# Patient Record
Sex: Female | Born: 1965
Health system: Southern US, Community
[De-identification: ages and names within clinical notes are randomized; demographics above are authoritative.]

## PROBLEM LIST (undated history)

## (undated) DIAGNOSIS — K219 Gastro-esophageal reflux disease without esophagitis: Secondary | ICD-10-CM

## (undated) DIAGNOSIS — F419 Anxiety disorder, unspecified: Secondary | ICD-10-CM

## (undated) DIAGNOSIS — Z8489 Family history of other specified conditions: Secondary | ICD-10-CM

## (undated) DIAGNOSIS — K5792 Diverticulitis of intestine, part unspecified, without perforation or abscess without bleeding: Secondary | ICD-10-CM

## (undated) DIAGNOSIS — Z87442 Personal history of urinary calculi: Secondary | ICD-10-CM

## (undated) DIAGNOSIS — J189 Pneumonia, unspecified organism: Secondary | ICD-10-CM

## (undated) DIAGNOSIS — Z86718 Personal history of other venous thrombosis and embolism: Secondary | ICD-10-CM

## (undated) DIAGNOSIS — M1990 Unspecified osteoarthritis, unspecified site: Secondary | ICD-10-CM

## (undated) DIAGNOSIS — F32A Depression, unspecified: Secondary | ICD-10-CM

## (undated) DIAGNOSIS — M810 Age-related osteoporosis without current pathological fracture: Secondary | ICD-10-CM

## (undated) DIAGNOSIS — E785 Hyperlipidemia, unspecified: Secondary | ICD-10-CM

## (undated) DIAGNOSIS — N189 Chronic kidney disease, unspecified: Secondary | ICD-10-CM

## (undated) DIAGNOSIS — T7840XA Allergy, unspecified, initial encounter: Secondary | ICD-10-CM

## (undated) DIAGNOSIS — Q8901 Asplenia (congenital): Secondary | ICD-10-CM

## (undated) DIAGNOSIS — F329 Major depressive disorder, single episode, unspecified: Secondary | ICD-10-CM

## (undated) DIAGNOSIS — Q6680 Congenital vertical talus deformity, unspecified foot: Secondary | ICD-10-CM

## (undated) HISTORY — DX: Hyperlipidemia, unspecified: E78.5

## (undated) HISTORY — DX: Asplenia (congenital): Q89.01

## (undated) HISTORY — PX: OTHER SURGICAL HISTORY: SHX169

## (undated) HISTORY — DX: Depression, unspecified: F32.A

## (undated) HISTORY — PX: BREAST EXCISIONAL BIOPSY: SUR124

## (undated) HISTORY — PX: BREAST SURGERY: SHX581

## (undated) HISTORY — DX: Congenital vertical talus deformity, unspecified foot: Q66.80

## (undated) HISTORY — PX: DILATION AND CURETTAGE OF UTERUS: SHX78

## (undated) HISTORY — DX: Age-related osteoporosis without current pathological fracture: M81.0

## (undated) HISTORY — DX: Major depressive disorder, single episode, unspecified: F32.9

## (undated) HISTORY — PX: BREAST BIOPSY: SHX20

## (undated) HISTORY — DX: Allergy, unspecified, initial encounter: T78.40XA

---

## 1993-11-29 HISTORY — PX: BREAST EXCISIONAL BIOPSY: SUR124

## 1998-11-29 DIAGNOSIS — Q8901 Asplenia (congenital): Secondary | ICD-10-CM

## 1998-11-29 HISTORY — PX: OTHER SURGICAL HISTORY: SHX169

## 1998-11-29 HISTORY — DX: Asplenia (congenital): Q89.01

## 1998-11-29 HISTORY — PX: SPLENECTOMY: SUR1306

## 2013-01-26 ENCOUNTER — Other Ambulatory Visit: Payer: Self-pay | Admitting: Orthopedic Surgery

## 2013-01-29 ENCOUNTER — Encounter (HOSPITAL_BASED_OUTPATIENT_CLINIC_OR_DEPARTMENT_OTHER)
Admission: RE | Disposition: A | Discharge status: Home / Self Care | Payer: Self-pay | Source: Ambulatory Visit | Attending: Orthopedic Surgery

## 2013-01-29 ENCOUNTER — Ambulatory Visit (HOSPITAL_BASED_OUTPATIENT_CLINIC_OR_DEPARTMENT_OTHER): Payer: Worker's Compensation | Admitting: Anesthesiology

## 2013-01-29 ENCOUNTER — Ambulatory Visit (HOSPITAL_BASED_OUTPATIENT_CLINIC_OR_DEPARTMENT_OTHER)
Admission: RE | Admit: 2013-01-29 | Discharge: 2013-01-29 | Disposition: A | Discharge status: Home / Self Care | Payer: Worker's Compensation | Source: Ambulatory Visit | Attending: Orthopedic Surgery | Admitting: Orthopedic Surgery

## 2013-01-29 ENCOUNTER — Encounter (HOSPITAL_BASED_OUTPATIENT_CLINIC_OR_DEPARTMENT_OTHER): Payer: Self-pay | Admitting: Anesthesiology

## 2013-01-29 ENCOUNTER — Encounter (HOSPITAL_BASED_OUTPATIENT_CLINIC_OR_DEPARTMENT_OTHER): Payer: Self-pay | Admitting: *Deleted

## 2013-01-29 DIAGNOSIS — Y9269 Other specified industrial and construction area as the place of occurrence of the external cause: Secondary | ICD-10-CM | POA: Insufficient documentation

## 2013-01-29 DIAGNOSIS — K219 Gastro-esophageal reflux disease without esophagitis: Secondary | ICD-10-CM | POA: Insufficient documentation

## 2013-01-29 DIAGNOSIS — S61209A Unspecified open wound of unspecified finger without damage to nail, initial encounter: Secondary | ICD-10-CM | POA: Insufficient documentation

## 2013-01-29 DIAGNOSIS — F411 Generalized anxiety disorder: Secondary | ICD-10-CM | POA: Insufficient documentation

## 2013-01-29 DIAGNOSIS — Y990 Civilian activity done for income or pay: Secondary | ICD-10-CM | POA: Insufficient documentation

## 2013-01-29 DIAGNOSIS — W1809XA Striking against other object with subsequent fall, initial encounter: Secondary | ICD-10-CM | POA: Insufficient documentation

## 2013-01-29 HISTORY — DX: Anxiety disorder, unspecified: F41.9

## 2013-01-29 HISTORY — DX: Gastro-esophageal reflux disease without esophagitis: K21.9

## 2013-01-29 HISTORY — DX: Family history of other specified conditions: Z84.89

## 2013-01-29 HISTORY — DX: Pneumonia, unspecified organism: J18.9

## 2013-01-29 HISTORY — PX: FOREIGN BODY REMOVAL: SHX962

## 2013-01-29 SURGERY — REMOVAL FOREIGN BODY EXTREMITY
Anesthesia: General | Site: Hand | Laterality: Right | Wound class: Dirty or Infected

## 2013-01-29 MED ORDER — OXYCODONE HCL 5 MG PO TABS: 5.0000 mg | ORAL_TABLET | Freq: Once | ORAL | Status: DC | PRN

## 2013-01-29 MED ORDER — BUPIVACAINE HCL (PF) 0.25 % IJ SOLN
INTRAMUSCULAR | Status: DC | PRN
  Administered 2013-01-29: 4 mL

## 2013-01-29 MED ORDER — LIDOCAINE HCL (CARDIAC) 20 MG/ML IV SOLN
INTRAVENOUS | Status: DC | PRN
  Administered 2013-01-29: 50 mg via INTRAVENOUS

## 2013-01-29 MED ORDER — ONDANSETRON HCL 4 MG/2ML IJ SOLN
INTRAMUSCULAR | Status: DC | PRN
  Administered 2013-01-29: 4 mg via INTRAVENOUS

## 2013-01-29 MED ORDER — PROPOFOL 10 MG/ML IV BOLUS
INTRAVENOUS | Status: DC | PRN
  Administered 2013-01-29: 200 mg via INTRAVENOUS

## 2013-01-29 MED ORDER — CEFAZOLIN SODIUM-DEXTROSE 2-3 GM-% IV SOLR: 2.0000 g | INTRAVENOUS | Status: DC

## 2013-01-29 MED ORDER — HYDROMORPHONE HCL PF 1 MG/ML IJ SOLN: 0.2500 mg | INTRAMUSCULAR | Status: DC | PRN

## 2013-01-29 MED ORDER — PROMETHAZINE HCL 25 MG/ML IJ SOLN: 6.2500 mg | INTRAMUSCULAR | Status: DC | PRN

## 2013-01-29 MED ORDER — CHLORHEXIDINE GLUCONATE 4 % EX LIQD
60.0000 mL | Freq: Once | CUTANEOUS | Status: DC
Start: 2013-01-29 — End: 2013-01-29

## 2013-01-29 MED ORDER — CHLORHEXIDINE GLUCONATE 4 % EX LIQD: 60.0000 mL | Freq: Once | CUTANEOUS | Status: DC

## 2013-01-29 MED ORDER — FENTANYL CITRATE 0.05 MG/ML IJ SOLN
INTRAMUSCULAR | Status: DC | PRN
  Administered 2013-01-29 (×2): 50 ug via INTRAVENOUS

## 2013-01-29 MED ORDER — SUCCINYLCHOLINE CHLORIDE 20 MG/ML IJ SOLN
INTRAMUSCULAR | Status: DC | PRN
  Administered 2013-01-29: 20 mg via INTRAVENOUS

## 2013-01-29 MED ORDER — LACTATED RINGERS IV SOLN
INTRAVENOUS | Status: DC
  Administered 2013-01-29: 12:00:00 via INTRAVENOUS

## 2013-01-29 MED ORDER — OXYCODONE HCL 5 MG/5ML PO SOLN: 5.0000 mg | Freq: Once | ORAL | Status: DC | PRN

## 2013-01-29 MED ORDER — MIDAZOLAM HCL 5 MG/5ML IJ SOLN
INTRAMUSCULAR | Status: DC | PRN
  Administered 2013-01-29: 1 mg via INTRAVENOUS

## 2013-01-29 MED ORDER — MEPERIDINE HCL 25 MG/ML IJ SOLN: 6.2500 mg | INTRAMUSCULAR | Status: DC | PRN

## 2013-01-29 MED ORDER — CEFAZOLIN SODIUM-DEXTROSE 2-3 GM-% IV SOLR
2.0000 g | INTRAVENOUS | Status: AC
  Administered 2013-01-29: 2 g via INTRAVENOUS

## 2013-01-29 MED ORDER — DEXAMETHASONE SODIUM PHOSPHATE 10 MG/ML IJ SOLN
INTRAMUSCULAR | Status: DC | PRN
  Administered 2013-01-29: 10 mg via INTRAVENOUS

## 2013-01-29 MED ORDER — HYDROCODONE-ACETAMINOPHEN 5-325 MG PO TABS: 1.0000 | ORAL_TABLET | Freq: Four times a day (QID) | ORAL | Status: DC | PRN

## 2013-01-29 SURGICAL SUPPLY — 47 items
BANDAGE GAUZE ELAST BULKY 4 IN (GAUZE/BANDAGES/DRESSINGS) ×2 IMPLANT
BLADE MINI RND TIP GREEN BEAV (BLADE) IMPLANT
BLADE SURG 15 STRL LF DISP TIS (BLADE) ×1 IMPLANT
BLADE SURG 15 STRL SS (BLADE) ×1
BNDG COHESIVE 3X5 TAN STRL LF (GAUZE/BANDAGES/DRESSINGS) ×2 IMPLANT
BNDG ESMARK 4X9 LF (GAUZE/BANDAGES/DRESSINGS) ×2 IMPLANT
CHLORAPREP W/TINT 26ML (MISCELLANEOUS) ×2 IMPLANT
CORDS BIPOLAR (ELECTRODE) IMPLANT
COVER MAYO STAND STRL (DRAPES) ×2 IMPLANT
COVER TABLE BACK 60X90 (DRAPES) ×2 IMPLANT
CUFF TOURNIQUET SINGLE 18IN (TOURNIQUET CUFF) ×2 IMPLANT
DECANTER SPIKE VIAL GLASS SM (MISCELLANEOUS) IMPLANT
DRAPE EXTREMITY T 121X128X90 (DRAPE) ×2 IMPLANT
DRAPE OEC MINIVIEW 54X84 (DRAPES) ×2 IMPLANT
DRAPE SURG 17X23 STRL (DRAPES) ×2 IMPLANT
DRSG KUZMA FLUFF (GAUZE/BANDAGES/DRESSINGS) ×2 IMPLANT
GAUZE PACKING IODOFORM 1 (PACKING) ×2 IMPLANT
GAUZE XEROFORM 1X8 LF (GAUZE/BANDAGES/DRESSINGS) ×2 IMPLANT
GLOVE BIO SURGEON STRL SZ 6.5 (GLOVE) ×2 IMPLANT
GLOVE BIO SURGEON STRL SZ7.5 (GLOVE) ×2 IMPLANT
GLOVE BIOGEL PI IND STRL 8 (GLOVE) ×1 IMPLANT
GLOVE BIOGEL PI IND STRL 8.5 (GLOVE) ×1 IMPLANT
GLOVE BIOGEL PI INDICATOR 8 (GLOVE) ×1
GLOVE BIOGEL PI INDICATOR 8.5 (GLOVE) ×1
GLOVE SURG ORTHO 8.0 STRL STRW (GLOVE) ×2 IMPLANT
GOWN BRE IMP PREV XXLGXLNG (GOWN DISPOSABLE) ×4 IMPLANT
GOWN PREVENTION PLUS XLARGE (GOWN DISPOSABLE) ×2 IMPLANT
NEEDLE HYPO 22GX1.5 SAFETY (NEEDLE) IMPLANT
NS IRRIG 1000ML POUR BTL (IV SOLUTION) ×2 IMPLANT
PACK BASIN DAY SURGERY FS (CUSTOM PROCEDURE TRAY) ×2 IMPLANT
PAD CAST 3X4 CTTN HI CHSV (CAST SUPPLIES) ×1 IMPLANT
PADDING CAST ABS 3INX4YD NS (CAST SUPPLIES)
PADDING CAST ABS 4INX4YD NS (CAST SUPPLIES) ×1
PADDING CAST ABS COTTON 3X4 (CAST SUPPLIES) IMPLANT
PADDING CAST ABS COTTON 4X4 ST (CAST SUPPLIES) ×1 IMPLANT
PADDING CAST COTTON 3X4 STRL (CAST SUPPLIES) ×1
SPLINT PLASTER CAST XFAST 3X15 (CAST SUPPLIES) IMPLANT
SPLINT PLASTER XTRA FASTSET 3X (CAST SUPPLIES)
SPONGE GAUZE 4X4 12PLY (GAUZE/BANDAGES/DRESSINGS) ×2 IMPLANT
STOCKINETTE 4X48 STRL (DRAPES) ×2 IMPLANT
SUT ETHILON 5 0 P 3 18 (SUTURE)
SUT ETHILON 5 0 PS 2 18 (SUTURE) IMPLANT
SUT NYLON ETHILON 5-0 P-3 1X18 (SUTURE) IMPLANT
SYR CONTROL 10ML LL (SYRINGE) ×2 IMPLANT
TOWEL OR 17X24 6PK STRL BLUE (TOWEL DISPOSABLE) ×2 IMPLANT
UNDERPAD 30X30 INCONTINENT (UNDERPADS AND DIAPERS) ×2 IMPLANT
WATER STERILE IRR 1000ML POUR (IV SOLUTION) IMPLANT

## 2013-01-29 NOTE — Op Note (Signed)
Cathy Simmons, Cathy Simmons              ACCOUNT NO.:  [id number]  MEDICAL RECORD NO.:  [id number]  LOCATION:                                 FACILITY:  PHYSICIAN:  Cindee Salt, M.D.            DATE OF BIRTH:  DATE OF PROCEDURE:  01/29/2013 DATE OF DISCHARGE:                              OPERATIVE REPORT   PREOPERATIVE DIAGNOSIS:  Foreign bodies, right hand.  POSTOPERATIVE DIAGNOSIS:  Foreign bodies, right hand.  OPERATION:  Excision of foreign bodies, right small finger, metacarpophalangeal joint, volar aspect.  SURGEON:  Cindee Salt, M.D.  ASSISTANT:  Betha Loa, M.D.  ANESTHESIA:  General with local infiltration.  ANESTHESIOLOGIST:  Burna Forts, M.D.  HISTORY:  The patient is a 47 year old female who while at work, suffered an injury to her right small finger metacarpophalangeal joint crease with multiple small foreign bodies.  She is admitted now for excision.  This has become infected with a small pustule on the most dorsal aspect just beneath the skin.  Pre, peri, and postoperative course have been discussed along with risks and complications.  She has been on doxycycline.  She is aware that there is no guarantee with surgery; possibility of infection; recurrence of injury to arteries, nerves, tendons; incomplete relief of symptoms and dystrophy that this will be packed open to allow drainage.  In the preoperative area, the patient is seen, the extremity marked by both patient and surgeon.  PROCEDURE:  The patient was brought to the operating room where a general anesthetic was carried out without difficulty.  She was prepped using ChloraPrep, supine position with the right arm free.  A 3-minute dry time was allowed.  Time-out taken, confirming the patient and procedure.  The limb was exsanguinated from the wrist with an Esmarch bandage.  Tourniquet was placed on the upper arm was inflated to 250 mmHg.  An oblique incision was made.  Purulent material was  immediately encountered.  Cultures were taken.  One small foreign bodies and one large foreign body were removed without difficulty.  The wound was debrided sharply and the wound was copiously irrigated with saline and packed open with iodoform gauze.  Sterile compressive dressing was applied.  A local infiltration with 0.25% Marcaine without epinephrine was given, 4 mL was used.  Sterile compressive dressing applied.  On deflation of the tourniquet, all fingers were immediately pinked.  She was taken to the recovery room for observation in satisfactory condition.  She will be discharged home, to return in 3 days on Vicodin and doxycycline, awaiting cultures.          ______________________________ Cindee Salt, M.D.     GK/MEDQ  D:  01/29/2013  T:  01/29/2013  Job:  [id number]

## 2013-01-29 NOTE — Anesthesia Preprocedure Evaluation (Signed)
Anesthesia Evaluation   Patient awake    Reviewed: Allergy & Precautions, H&P , NPO status   History of Anesthesia Complications Negative for: history of anesthetic complications  Airway Mallampati: II      Dental  (+) Teeth Intact and Dental Advisory Given   Pulmonary  breath sounds clear to auscultation        Cardiovascular negative cardio ROS  Rhythm:Regular Rate:Normal     Neuro/Psych Anxiety    GI/Hepatic Neg liver ROS, GERD-  ,  Endo/Other  negative endocrine ROS  Renal/GU negative Renal ROS     Musculoskeletal   Abdominal   Peds  Hematology negative hematology ROS (+)   Anesthesia Other Findings   Reproductive/Obstetrics                           Anesthesia Physical Anesthesia Plan  ASA: I  Anesthesia Plan: General   Post-op Pain Management:    Induction: Intravenous  Airway Management Planned: LMA  Additional Equipment:   Intra-op Plan:   Post-operative Plan: Extubation in OR  Informed Consent:   Plan Discussed with: CRNA  Anesthesia Plan Comments:         Anesthesia Quick Evaluation

## 2013-01-29 NOTE — Brief Op Note (Signed)
01/29/2013  2:49 PM  PATIENT:  Cathy Simmons  47 y.o. female  PRE-OPERATIVE DIAG2NOSIS:  FOREIGN BODY RIGHT hand  POST-OPERATIVE DIAGNOSIS:  FOREIGN BODY RIGHT hand  PROCEDURE:  Procedure(s): EXCISION FOREIGN BODY RIGHT SMALL FINGER (Right)  SURGEON:  Surgeon(s) and Role:    * Nicki Reaper, MD - Primary  PHYSICIAN ASSISTANT:   ASSISTANTS: K Kuzma,MD   ANESTHESIA:   local and general  EBL:  Total I/O In: 1000 [I.V.:1000] Out: -   BLOOD ADMINISTERED:none  DRAINS: none   LOCAL MEDICATIONS USED:  MARCAINE     SPECIMEN:  Source of Specimen:  culture  DISPOSITION OF SPECIMEN:  PATHOLOGY  COUNTS:  YES  TOURNIQUET:   Total Tourniquet Time Documented: Upper Arm (Right) - 15 minutes Total: Upper Arm (Right) - 15 minutes   DICTATION: .Other Dictation: Dictation Number [id number]  PLAN OF CARE: Discharge to home after PACU  PATIENT DISPOSITION:  PACU - hemodynamically stable.

## 2013-01-29 NOTE — H&P (Signed)
Cathy Simmons is a 47 year-old right-hand dominant female with  an injury she suffered to her right hand primarily over the metacarpophalangeal joint of her right small finger. This occurred on 2/22 while at work when she rubbed across a paint can she noticed the top was changed.  She has had discomfort.  They attempted to remove possible foreign bodies and were unsuccessful. She as subsequently seen yesterday, given a tetanus shot, attempted removal was negative.  X-rays were ultimately performed revealing small foreign bodies over the metacarpophalangeal joint palmar aspect of her right hand, small finger.  She has no prior history of injuries, no history of diabetes, thyroid problems, arthritis or gout.  She complains of an intermittent, moderate stabbing type pain following the attempted removal yesterday with a feeling of swelling.  She states it has gotten somewhat worse since the attempted removal. She has been taking Tylenol for discomfort.  ALLERGIES:   Pneumonia vaccine, Bactrim and Cymbalta.  MEDICATIONS:    BuSpar, Dexilant, vitamins, Zyrtec and birth control pill.  SURGICAL HISTORY:    Splenectomy, cyst removal from her wrist, D&C, biopsy of breast.  FAMILY MEDICAL HISTORY:   Positive for diabetes, heart disease, high blood pressure and arthritis.  SOCIAL HISTORY:    She does not smoke or drink. 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  REVIEW OF SYSTEMS:     Positive for glasses, pneumonia, shortness of breath, pain with urination, depression, otherwise negative. Cathy Simmons is an 47 y.o. female.   Chief Complaint: FB RT Hand small finger MCP HPI: see above  Past Medical History  Diagnosis Date  . Family history of anesthesia complication     mother difficult to wake  . Anxiety   . Pneumonia     15 years ago  . GERD (gastroesophageal reflux disease)     Past Surgical History  Procedure Laterality Date  . Breast surgery  breat biopsy x2 last 1995  . Dilation and  curettage of uterus  x2 1984 and 1991  . Excision left ganglion cyst Left   . Removal spleen  2000    History reviewed. No pertinent family history. Social History:  reports that she quit smoking about 14 years ago. She has never used smokeless tobacco. She reports that she does not drink alcohol or use illicit drugs.  Allergies:  Allergies  Allergen Reactions  . Bactrim (Sulfamethoxazole W-Trimethoprim)   . Cymbalta (Duloxetine Hcl) Anxiety    Medications Prior to Admission  Medication Sig Dispense Refill  . busPIRone (BUSPAR) 15 MG tablet Take 15 mg by mouth 3 (three) times daily.      . cetirizine (ZYRTEC) 10 MG tablet Take 10 mg by mouth daily.      Marland Kitchen doxycycline (ADOXA) 100 MG tablet Take 100 mg by mouth 2 (two) times daily.        Results for orders placed during the hospital encounter of 01/29/13 (from the past 48 hour(s))  POCT HEMOGLOBIN-HEMACUE     Status: None   Collection Time    01/29/13 11:32 AM      Result Value Range   Hemoglobin 14.2  12.0 - 15.0 g/dL    No results found.   Pertinent items are noted in HPI.  Blood pressure 115/73, pulse 88, temperature 98.5 F (36.9 C), temperature source Oral, resp. rate 16, height 5\' 2"  (1.575 m), weight 55.611 kg (122 lb 9.6 oz), last menstrual period 01/12/2013, SpO2 98.00%.  General appearance: alert, cooperative and appears stated  age Head: Normocephalic, without obvious abnormality Neck: no JVD Resp: clear to auscultation bilaterally Cardio: regular rate and rhythm, S1, S2 normal, no murmur, click, rub or gallop GI: soft, non-tender; bowel sounds normal; no masses,  no organomegaly Extremities: extremities normal, atraumatic, no cyanosis or edema Pulses: 2+ and symmetric Skin: Skin color, texture, turgor normal. No rashes or lesions Neurologic: Grossly normal Incision/Wound: na  Assessment/Plan RADIOGRAPHS:   X-rays reveal foreign material over the metacarpophalangeal joint palmar aspect of her right small  finger.   RECOMMENDATIONS/PLAN:   Plan is for surgical excision. We will place her on doxycycline and this will be scheduled as an outpatient under regional anesthesia.  KUZMA,GARY R 01/29/2013, 1:13 PM

## 2013-01-29 NOTE — Anesthesia Procedure Notes (Signed)
Procedure Name: LMA Insertion Date/Time: 01/29/2013 2:21 PM Performed by: Zenia Resides D Pre-anesthesia Checklist: Patient identified, Emergency Drugs available, Suction available and Patient being monitored Patient Re-evaluated:Patient Re-evaluated prior to inductionOxygen Delivery Method: Circle System Utilized Preoxygenation: Pre-oxygenation with 100% oxygen Intubation Type: IV induction Ventilation: Mask ventilation without difficulty LMA: LMA inserted LMA Size: 4.0 Number of attempts: 1 Airway Equipment and Method: bite block Placement Confirmation: positive ETCO2 Tube secured with: Tape Dental Injury: Teeth and Oropharynx as per pre-operative assessment

## 2013-01-29 NOTE — Anesthesia Postprocedure Evaluation (Signed)
  Anesthesia Post-op Note  Patient: Cathy Simmons  Procedure(s) Performed: Procedure(s): EXCISION FOREIGN BODY RIGHT SMALL FINGER (Right)  Patient Location: PACU  Anesthesia Type:General  Level of Consciousness: awake  Airway and Oxygen Therapy: Patient Spontanous Breathing  Post-op Pain: none  Post-op Assessment: Post-op Vital signs reviewed  Post-op Vital Signs: stable  Complications: No apparent anesthesia complications

## 2013-01-29 NOTE — Transfer of Care (Signed)
Immediate Anesthesia Transfer of Care Note  Patient: Cathy Simmons  Procedure(s) Performed: Procedure(s): EXCISION FOREIGN BODY RIGHT SMALL FINGER (Right)  Patient Location: PACU  Anesthesia Type:General  Level of Consciousness: awake and alert   Airway & Oxygen Therapy: Patient Spontanous Breathing and Patient connected to face mask oxygen  Post-op Assessment: Report given to PACU RN and Post -op Vital signs reviewed and stable  Post vital signs: Reviewed and stable  Complications: No apparent anesthesia complications

## 2013-01-29 NOTE — Op Note (Signed)
Dictation Number [id number]

## 2013-01-31 ENCOUNTER — Encounter (HOSPITAL_BASED_OUTPATIENT_CLINIC_OR_DEPARTMENT_OTHER): Payer: Self-pay | Admitting: Orthopedic Surgery

## 2013-01-31 LAB — WOUND CULTURE: Culture: NO GROWTH

## 2013-02-04 LAB — ANAEROBIC CULTURE

## 2013-02-27 LAB — FUNGUS CULTURE W SMEAR

## 2013-03-14 LAB — AFB CULTURE WITH SMEAR (NOT AT ARMC): Acid Fast Smear: NONE SEEN

## 2013-11-29 HISTORY — PX: BREAST BIOPSY: SHX20

## 2013-12-03 ENCOUNTER — Encounter (HOSPITAL_COMMUNITY): Payer: Self-pay | Admitting: Emergency Medicine

## 2013-12-03 DIAGNOSIS — N3000 Acute cystitis without hematuria: Secondary | ICD-10-CM | POA: Insufficient documentation

## 2013-12-03 DIAGNOSIS — R109 Unspecified abdominal pain: Secondary | ICD-10-CM | POA: Insufficient documentation

## 2013-12-03 DIAGNOSIS — Z881 Allergy status to other antibiotic agents status: Secondary | ICD-10-CM | POA: Insufficient documentation

## 2013-12-03 DIAGNOSIS — Z8701 Personal history of pneumonia (recurrent): Secondary | ICD-10-CM | POA: Insufficient documentation

## 2013-12-03 DIAGNOSIS — R6883 Chills (without fever): Secondary | ICD-10-CM | POA: Insufficient documentation

## 2013-12-03 DIAGNOSIS — R5383 Other fatigue: Secondary | ICD-10-CM

## 2013-12-03 DIAGNOSIS — F411 Generalized anxiety disorder: Secondary | ICD-10-CM | POA: Insufficient documentation

## 2013-12-03 DIAGNOSIS — R110 Nausea: Secondary | ICD-10-CM | POA: Insufficient documentation

## 2013-12-03 DIAGNOSIS — Z3202 Encounter for pregnancy test, result negative: Secondary | ICD-10-CM | POA: Insufficient documentation

## 2013-12-03 DIAGNOSIS — Z87891 Personal history of nicotine dependence: Secondary | ICD-10-CM | POA: Insufficient documentation

## 2013-12-03 DIAGNOSIS — Z79899 Other long term (current) drug therapy: Secondary | ICD-10-CM | POA: Insufficient documentation

## 2013-12-03 DIAGNOSIS — K219 Gastro-esophageal reflux disease without esophagitis: Secondary | ICD-10-CM | POA: Insufficient documentation

## 2013-12-03 DIAGNOSIS — R5381 Other malaise: Secondary | ICD-10-CM | POA: Insufficient documentation

## 2013-12-03 DIAGNOSIS — Z888 Allergy status to other drugs, medicaments and biological substances status: Secondary | ICD-10-CM | POA: Insufficient documentation

## 2013-12-03 LAB — URINALYSIS, ROUTINE W REFLEX MICROSCOPIC
Bilirubin Urine: NEGATIVE
Glucose, UA: NEGATIVE mg/dL
KETONES UR: 15 mg/dL — AB
NITRITE: NEGATIVE
PH: 5.5 (ref 5.0–8.0)
Protein, ur: 100 mg/dL — AB
Specific Gravity, Urine: 1.023 (ref 1.005–1.030)
Urobilinogen, UA: 1 mg/dL (ref 0.0–1.0)

## 2013-12-03 LAB — URINE MICROSCOPIC-ADD ON

## 2013-12-03 LAB — POCT PREGNANCY, URINE: Preg Test, Ur: NEGATIVE

## 2013-12-03 NOTE — ED Notes (Signed)
Pt reports pelvic "pressure" that began this a.m. - pt states she has been experiencing hematuria and noting blood clots w/ urination - pt w/ lower abd pain and lower back pain - pt admits to nausea denies vomiting. Denies any known fever.

## 2013-12-04 ENCOUNTER — Emergency Department (HOSPITAL_COMMUNITY)
Admission: EM | Admit: 2013-12-04 | Discharge: 2013-12-04 | Disposition: A | Discharge status: Home / Self Care | Payer: BC Managed Care – PPO | Attending: Emergency Medicine | Admitting: Emergency Medicine

## 2013-12-04 DIAGNOSIS — N3090 Cystitis, unspecified without hematuria: Secondary | ICD-10-CM

## 2013-12-04 LAB — CBC WITH DIFFERENTIAL/PLATELET
Basophils Absolute: 0.1 10*3/uL (ref 0.0–0.1)
Basophils Relative: 0 % (ref 0–1)
Eosinophils Absolute: 0 10*3/uL (ref 0.0–0.7)
Eosinophils Relative: 0 % (ref 0–5)
HCT: 41 % (ref 36.0–46.0)
Hemoglobin: 14.3 g/dL (ref 12.0–15.0)
Lymphocytes Relative: 16 % (ref 12–46)
Lymphs Abs: 2.7 10*3/uL (ref 0.7–4.0)
MCH: 33.1 pg (ref 26.0–34.0)
MCHC: 34.9 g/dL (ref 30.0–36.0)
MCV: 94.9 fL (ref 78.0–100.0)
Monocytes Absolute: 1.3 10*3/uL — ABNORMAL HIGH (ref 0.1–1.0)
Monocytes Relative: 8 % (ref 3–12)
NEUTROS ABS: 13 10*3/uL — AB (ref 1.7–7.7)
NEUTROS PCT: 76 % (ref 43–77)
Platelets: 314 10*3/uL (ref 150–400)
RBC: 4.32 MIL/uL (ref 3.87–5.11)
RDW: 13.8 % (ref 11.5–15.5)
WBC: 17 10*3/uL — ABNORMAL HIGH (ref 4.0–10.5)

## 2013-12-04 LAB — COMPREHENSIVE METABOLIC PANEL
ALBUMIN: 3.4 g/dL — AB (ref 3.5–5.2)
ALK PHOS: 72 U/L (ref 39–117)
ALT: 9 U/L (ref 0–35)
AST: 13 U/L (ref 0–37)
BILIRUBIN TOTAL: 0.4 mg/dL (ref 0.3–1.2)
BUN: 12 mg/dL (ref 6–23)
CO2: 26 mEq/L (ref 19–32)
Calcium: 9.3 mg/dL (ref 8.4–10.5)
Chloride: 102 mEq/L (ref 96–112)
Creatinine, Ser: 0.63 mg/dL (ref 0.50–1.10)
GFR calc Af Amer: >90 mL/min (ref >90)
GFR calc non Af Amer: >90 mL/min (ref >90)
Glucose, Bld: 93 mg/dL (ref 70–99)
POTASSIUM: 4 meq/L (ref 3.7–5.3)
SODIUM: 140 meq/L (ref 137–147)
Total Protein: 7.7 g/dL (ref 6.0–8.3)

## 2013-12-04 MED ORDER — DEXTROSE 5 % IV SOLN
1.0000 g | Freq: Once | INTRAVENOUS | Status: AC
  Administered 2013-12-04: 1 g via INTRAVENOUS
  Filled 2013-12-04: qty 10

## 2013-12-04 MED ORDER — SODIUM CHLORIDE 0.9 % IV BOLUS (SEPSIS)
1000.0000 mL | Freq: Once | INTRAVENOUS | Status: AC
  Administered 2013-12-04: 1000 mL via INTRAVENOUS

## 2013-12-04 MED ORDER — PHENAZOPYRIDINE HCL 200 MG PO TABS: 200.0000 mg | ORAL_TABLET | Freq: Three times a day (TID) | ORAL | Status: DC | PRN

## 2013-12-04 MED ORDER — CEPHALEXIN 500 MG PO CAPS: 500.0000 mg | ORAL_CAPSULE | Freq: Four times a day (QID) | ORAL | Status: DC

## 2013-12-04 MED ORDER — IBUPROFEN 600 MG PO TABS: 600.0000 mg | ORAL_TABLET | Freq: Four times a day (QID) | ORAL | Status: DC | PRN

## 2013-12-04 MED ORDER — KETOROLAC TROMETHAMINE 30 MG/ML IJ SOLN
30.0000 mg | Freq: Once | INTRAMUSCULAR | Status: AC
  Administered 2013-12-04: 30 mg via INTRAVENOUS
  Filled 2013-12-04: qty 1

## 2013-12-04 NOTE — Discharge Instructions (Signed)
Urinary Tract Infection  Urinary tract infections (UTIs) can develop anywhere along your urinary tract. Your urinary tract is your body's drainage system for removing wastes and extra water. Your urinary tract includes two kidneys, two ureters, a bladder, and a urethra. Your kidneys are a pair of bean-shaped organs. Each kidney is about the size of your fist. They are located below your ribs, one on each side of your spine.  CAUSES  Infections are caused by microbes, which are microscopic organisms, including fungi, viruses, and bacteria. These organisms are so small that they can only be seen through a microscope. Bacteria are the microbes that most commonly cause UTIs.  SYMPTOMS   Symptoms of UTIs may vary by age and gender of the patient and by the location of the infection. Symptoms in young women typically include a frequent and intense urge to urinate and a painful, burning feeling in the bladder or urethra during urination. Older women and men are more likely to be tired, shaky, and weak and have muscle aches and abdominal pain. A fever may mean the infection is in your kidneys. Other symptoms of a kidney infection include pain in your back or sides below the ribs, nausea, and vomiting.  DIAGNOSIS  To diagnose a UTI, your caregiver will ask you about your symptoms. Your caregiver also will ask to provide a urine sample. The urine sample will be tested for bacteria and white blood cells. White blood cells are made by your body to help fight infection.  TREATMENT   Typically, UTIs can be treated with medication. Because most UTIs are caused by a bacterial infection, they usually can be treated with the use of antibiotics. The choice of antibiotic and length of treatment depend on your symptoms and the type of bacteria causing your infection.  HOME CARE INSTRUCTIONS   If you were prescribed antibiotics, take them exactly as your caregiver instructs you. Finish the medication even if you feel better after you  have only taken some of the medication.   Drink enough water and fluids to keep your urine clear or pale yellow.   Avoid caffeine, tea, and carbonated beverages. They tend to irritate your bladder.   Empty your bladder often. Avoid holding urine for long periods of time.   Empty your bladder before and after sexual intercourse.   After a bowel movement, women should cleanse from front to back. Use each tissue only once.  SEEK MEDICAL CARE IF:    You have back pain.   You develop a fever.   Your symptoms do not begin to resolve within 3 days.  SEEK IMMEDIATE MEDICAL CARE IF:    You have severe back pain or lower abdominal pain.   You develop chills.   You have nausea or vomiting.   You have continued burning or discomfort with urination.  MAKE SURE YOU:    Understand these instructions.   Will watch your condition.   Will get help right away if you are not doing well or get worse.  Document Released: 08/25/2005 Document Revised: 05/16/2012 Document Reviewed: 12/24/2011  [employer] Patient Information 2014 [employer].

## 2013-12-04 NOTE — ED Provider Notes (Signed)
CSN: [id number]     Arrival date & time 12/03/13  1916 History   First MD Initiated Contact with Patient 12/04/13 0009     Chief Complaint  Patient presents with  . Hematuria  . Abdominal Pain   (Consider location/radiation/quality/duration/timing/severity/associated sxs/prior Treatment) HPI Patient presents with one day of lower abdominal pressure, urinary urgency, frequency and dysuria. She has had gross hematuria with clots. She's had chills but no subjective fever. Just generalized fatigue. She has had nausea without vomiting or diarrhea. She denies vaginal bleeding or discharge. She still has regular periods the last one being last month. She has no flank pain. Past Medical History  Diagnosis Date  . Family history of anesthesia complication     mother difficult to wake  . Anxiety   . Pneumonia     15 years ago  . GERD (gastroesophageal reflux disease)    Past Surgical History  Procedure Laterality Date  . Breast surgery  breat biopsy x2 last 1995  . Dilation and curettage of uterus  x2 1984 and 1991  . Excision left ganglion cyst Left   . Removal spleen  2000  . Foreign body removal Right 01/29/2013    Procedure: EXCISION FOREIGN BODY RIGHT SMALL FINGER;  Surgeon: Nicki ReaperGary R Kuzma, MD;  Location: [facility];  Service: Orthopedics;  Laterality: Right;   History reviewed. No pertinent family history. History  Substance Use Topics  . Smoking status: Former Smoker -- 0.50 packs/day    Quit date: 01/30/1999  . Smokeless tobacco: Never Used  . Alcohol Use: No   OB History   Grav Para Term Preterm Abortions TAB SAB Ect Mult Living                 Review of Systems  Constitutional: Positive for chills and fatigue. Negative for fever.  Respiratory: Negative for shortness of breath.   Cardiovascular: Negative for chest pain.  Gastrointestinal: Positive for nausea and abdominal pain. Negative for vomiting, diarrhea and constipation.  Genitourinary: Positive for  dysuria, frequency and hematuria. Negative for flank pain, vaginal bleeding, vaginal discharge and vaginal pain.  Musculoskeletal: Negative for back pain, neck pain and neck stiffness.  Skin: Negative for rash and wound.  Neurological: Negative for dizziness, weakness, light-headedness, numbness and headaches.  All other systems reviewed and are negative.    Allergies  Bactrim and Cymbalta  Home Medications   Current Outpatient Rx  Name  Route  Sig  Dispense  Refill  . busPIRone (BUSPAR) 15 MG tablet   Oral   Take 15 mg by mouth 3 (three) times daily.         . cetirizine (ZYRTEC) 10 MG tablet   Oral   Take 10 mg by mouth at bedtime.          Marland Kitchen. dexlansoprazole (DEXILANT) 60 MG capsule   Oral   Take 60 mg by mouth every morning.         Marland Kitchen. FLUoxetine (PROZAC) 40 MG capsule   Oral   Take 40 mg by mouth every evening.         Marland Kitchen. levonorgestrel-ethinyl estradiol (NORDETTE) 0.15-30 MG-MCG tablet   Oral   Take 1 tablet by mouth every evening.          BP 127/57  Pulse 100  Temp(Src) 98.9 F (37.2 C) (Oral)  Resp 20  Ht 5\' 2"  (1.575 m)  Wt 123 lb (55.792 kg)  BMI 22.49 kg/m2  SpO2 97%  LMP 11/14/2013 Physical Exam  Nursing note and vitals reviewed. Constitutional: She is oriented to person, place, and time. She appears well-developed and well-nourished. No distress.  HENT:  Head: Normocephalic and atraumatic.  Mouth/Throat: Oropharynx is clear and moist.  Eyes: EOM are normal. Pupils are equal, round, and reactive to light.  Neck: Normal range of motion. Neck supple.  Cardiovascular: Normal rate and regular rhythm.   Pulmonary/Chest: Effort normal and breath sounds normal. No respiratory distress. She has no wheezes. She has no rales.  Abdominal: Soft. Bowel sounds are normal.  Musculoskeletal: Normal range of motion. She exhibits no edema and no tenderness.  Neurological: She is alert and oriented to person, place, and time.  Skin: Skin is warm and dry. No  rash noted. No erythema.  Psychiatric: She has a normal mood and affect. Her behavior is normal.    ED Course  Procedures (including critical care time) Labs Review Labs Reviewed  URINALYSIS, ROUTINE W REFLEX MICROSCOPIC - Abnormal; Notable for the following:    Color, Urine RED (*)    APPearance TURBID (*)    Hgb urine dipstick LARGE (*)    Ketones, ur 15 (*)    Protein, ur 100 (*)    Leukocytes, UA LARGE (*)    All other components within normal limits  URINE MICROSCOPIC-ADD ON - Abnormal; Notable for the following:    Bacteria, UA MANY (*)    All other components within normal limits  URINE CULTURE  CBC WITH DIFFERENTIAL  COMPREHENSIVE METABOLIC PANEL  POCT PREGNANCY, URINE   Imaging Review No results found.  EKG Interpretation   None       MDM  Patient is feeling better after IV fluids. Vital signs remained stable. Abdomen is soft and nontender. She has no CVA tenderness. We'll treat for cystitis. Return precautions have been given. Patient was understanding.    Loren Racer, MD 12/04/13 [phone]

## 2013-12-04 NOTE — ED Notes (Signed)
MD at bedside. 

## 2013-12-05 LAB — URINE CULTURE: Colony Count: >=100000

## 2013-12-06 NOTE — ED Notes (Signed)
No further treatment necessary per Heather Laisure. 

## 2016-03-28 DIAGNOSIS — J029 Acute pharyngitis, unspecified: Secondary | ICD-10-CM | POA: Diagnosis not present

## 2016-05-10 DIAGNOSIS — E782 Mixed hyperlipidemia: Secondary | ICD-10-CM | POA: Diagnosis not present

## 2016-05-10 DIAGNOSIS — K219 Gastro-esophageal reflux disease without esophagitis: Secondary | ICD-10-CM | POA: Diagnosis not present

## 2016-05-12 DIAGNOSIS — I636 Cerebral infarction due to cerebral venous thrombosis, nonpyogenic: Secondary | ICD-10-CM | POA: Diagnosis not present

## 2016-05-12 DIAGNOSIS — Z0001 Encounter for general adult medical examination with abnormal findings: Secondary | ICD-10-CM | POA: Diagnosis not present

## 2016-05-12 DIAGNOSIS — J301 Allergic rhinitis due to pollen: Secondary | ICD-10-CM | POA: Diagnosis not present

## 2016-05-12 DIAGNOSIS — E782 Mixed hyperlipidemia: Secondary | ICD-10-CM | POA: Diagnosis not present

## 2016-05-12 DIAGNOSIS — Z1212 Encounter for screening for malignant neoplasm of rectum: Secondary | ICD-10-CM | POA: Diagnosis not present

## 2016-05-12 DIAGNOSIS — F331 Major depressive disorder, recurrent, moderate: Secondary | ICD-10-CM | POA: Diagnosis not present

## 2016-07-15 DIAGNOSIS — Z6824 Body mass index (BMI) 24.0-24.9, adult: Secondary | ICD-10-CM | POA: Diagnosis not present

## 2016-07-15 DIAGNOSIS — L041 Acute lymphadenitis of trunk: Secondary | ICD-10-CM | POA: Diagnosis not present

## 2016-09-09 DIAGNOSIS — F331 Major depressive disorder, recurrent, moderate: Secondary | ICD-10-CM | POA: Diagnosis not present

## 2016-09-09 DIAGNOSIS — R300 Dysuria: Secondary | ICD-10-CM | POA: Diagnosis not present

## 2016-09-09 DIAGNOSIS — J301 Allergic rhinitis due to pollen: Secondary | ICD-10-CM | POA: Diagnosis not present

## 2016-09-09 DIAGNOSIS — E782 Mixed hyperlipidemia: Secondary | ICD-10-CM | POA: Diagnosis not present

## 2016-09-09 DIAGNOSIS — Z1389 Encounter for screening for other disorder: Secondary | ICD-10-CM | POA: Diagnosis not present

## 2016-09-09 DIAGNOSIS — I636 Cerebral infarction due to cerebral venous thrombosis, nonpyogenic: Secondary | ICD-10-CM | POA: Diagnosis not present

## 2016-09-09 DIAGNOSIS — Z23 Encounter for immunization: Secondary | ICD-10-CM | POA: Diagnosis not present

## 2016-09-09 DIAGNOSIS — M1611 Unilateral primary osteoarthritis, right hip: Secondary | ICD-10-CM | POA: Diagnosis not present

## 2016-09-10 DIAGNOSIS — R591 Generalized enlarged lymph nodes: Secondary | ICD-10-CM | POA: Diagnosis not present

## 2016-09-10 DIAGNOSIS — K219 Gastro-esophageal reflux disease without esophagitis: Secondary | ICD-10-CM | POA: Diagnosis not present

## 2016-09-10 DIAGNOSIS — Z86718 Personal history of other venous thrombosis and embolism: Secondary | ICD-10-CM | POA: Diagnosis not present

## 2016-09-10 DIAGNOSIS — Z79899 Other long term (current) drug therapy: Secondary | ICD-10-CM | POA: Diagnosis not present

## 2016-09-10 DIAGNOSIS — F329 Major depressive disorder, single episode, unspecified: Secondary | ICD-10-CM | POA: Diagnosis not present

## 2016-09-12 DIAGNOSIS — Z887 Allergy status to serum and vaccine status: Secondary | ICD-10-CM | POA: Diagnosis not present

## 2016-09-12 DIAGNOSIS — Z86718 Personal history of other venous thrombosis and embolism: Secondary | ICD-10-CM | POA: Diagnosis not present

## 2016-09-12 DIAGNOSIS — Z881 Allergy status to other antibiotic agents status: Secondary | ICD-10-CM | POA: Diagnosis not present

## 2016-09-12 DIAGNOSIS — K219 Gastro-esophageal reflux disease without esophagitis: Secondary | ICD-10-CM | POA: Diagnosis not present

## 2016-09-12 DIAGNOSIS — R197 Diarrhea, unspecified: Secondary | ICD-10-CM | POA: Diagnosis not present

## 2016-09-12 DIAGNOSIS — F99 Mental disorder, not otherwise specified: Secondary | ICD-10-CM | POA: Diagnosis not present

## 2016-09-12 DIAGNOSIS — M7989 Other specified soft tissue disorders: Secondary | ICD-10-CM | POA: Diagnosis not present

## 2016-09-12 DIAGNOSIS — I82890 Acute embolism and thrombosis of other specified veins: Secondary | ICD-10-CM | POA: Diagnosis not present

## 2016-09-12 DIAGNOSIS — Z87891 Personal history of nicotine dependence: Secondary | ICD-10-CM | POA: Diagnosis not present

## 2016-09-12 DIAGNOSIS — M542 Cervicalgia: Secondary | ICD-10-CM | POA: Diagnosis not present

## 2016-09-12 DIAGNOSIS — I6782 Cerebral ischemia: Secondary | ICD-10-CM | POA: Diagnosis not present

## 2016-09-12 DIAGNOSIS — I668 Occlusion and stenosis of other cerebral arteries: Secondary | ICD-10-CM | POA: Diagnosis not present

## 2016-09-12 DIAGNOSIS — R221 Localized swelling, mass and lump, neck: Secondary | ICD-10-CM | POA: Diagnosis not present

## 2016-09-12 DIAGNOSIS — Z9109 Other allergy status, other than to drugs and biological substances: Secondary | ICD-10-CM | POA: Diagnosis not present

## 2016-09-12 DIAGNOSIS — G08 Intracranial and intraspinal phlebitis and thrombophlebitis: Secondary | ICD-10-CM | POA: Diagnosis not present

## 2016-09-12 DIAGNOSIS — I8290 Acute embolism and thrombosis of unspecified vein: Secondary | ICD-10-CM | POA: Diagnosis not present

## 2016-09-13 DIAGNOSIS — G08 Intracranial and intraspinal phlebitis and thrombophlebitis: Secondary | ICD-10-CM | POA: Diagnosis not present

## 2016-09-13 DIAGNOSIS — M7989 Other specified soft tissue disorders: Secondary | ICD-10-CM | POA: Diagnosis not present

## 2016-09-13 DIAGNOSIS — I8290 Acute embolism and thrombosis of unspecified vein: Secondary | ICD-10-CM | POA: Diagnosis not present

## 2016-09-14 DIAGNOSIS — I8290 Acute embolism and thrombosis of unspecified vein: Secondary | ICD-10-CM | POA: Diagnosis not present

## 2016-09-14 DIAGNOSIS — R221 Localized swelling, mass and lump, neck: Secondary | ICD-10-CM | POA: Diagnosis not present

## 2016-09-14 DIAGNOSIS — G08 Intracranial and intraspinal phlebitis and thrombophlebitis: Secondary | ICD-10-CM | POA: Diagnosis not present

## 2016-09-24 DIAGNOSIS — Z6824 Body mass index (BMI) 24.0-24.9, adult: Secondary | ICD-10-CM | POA: Diagnosis not present

## 2016-09-24 DIAGNOSIS — Z23 Encounter for immunization: Secondary | ICD-10-CM | POA: Diagnosis not present

## 2016-09-24 DIAGNOSIS — I636 Cerebral infarction due to cerebral venous thrombosis, nonpyogenic: Secondary | ICD-10-CM | POA: Diagnosis not present

## 2016-09-29 DIAGNOSIS — G08 Intracranial and intraspinal phlebitis and thrombophlebitis: Secondary | ICD-10-CM | POA: Diagnosis not present

## 2016-09-29 DIAGNOSIS — R221 Localized swelling, mass and lump, neck: Secondary | ICD-10-CM | POA: Diagnosis not present

## 2016-09-29 DIAGNOSIS — K219 Gastro-esophageal reflux disease without esophagitis: Secondary | ICD-10-CM | POA: Diagnosis not present

## 2016-09-29 DIAGNOSIS — Z86718 Personal history of other venous thrombosis and embolism: Secondary | ICD-10-CM | POA: Diagnosis not present

## 2016-09-29 DIAGNOSIS — Z87891 Personal history of nicotine dependence: Secondary | ICD-10-CM | POA: Diagnosis not present

## 2016-09-29 DIAGNOSIS — Z7901 Long term (current) use of anticoagulants: Secondary | ICD-10-CM | POA: Diagnosis not present

## 2016-09-29 DIAGNOSIS — Z5181 Encounter for therapeutic drug level monitoring: Secondary | ICD-10-CM | POA: Diagnosis not present

## 2016-10-25 DIAGNOSIS — Z23 Encounter for immunization: Secondary | ICD-10-CM | POA: Diagnosis not present

## 2016-10-26 DIAGNOSIS — Z1231 Encounter for screening mammogram for malignant neoplasm of breast: Secondary | ICD-10-CM | POA: Diagnosis not present

## 2016-10-29 DIAGNOSIS — R5383 Other fatigue: Secondary | ICD-10-CM | POA: Diagnosis not present

## 2016-10-29 DIAGNOSIS — G08 Intracranial and intraspinal phlebitis and thrombophlebitis: Secondary | ICD-10-CM | POA: Diagnosis not present

## 2016-10-29 DIAGNOSIS — Z5181 Encounter for therapeutic drug level monitoring: Secondary | ICD-10-CM | POA: Diagnosis not present

## 2016-10-29 DIAGNOSIS — Z7901 Long term (current) use of anticoagulants: Secondary | ICD-10-CM | POA: Diagnosis not present

## 2016-11-01 DIAGNOSIS — F331 Major depressive disorder, recurrent, moderate: Secondary | ICD-10-CM | POA: Diagnosis not present

## 2016-11-01 DIAGNOSIS — Z6824 Body mass index (BMI) 24.0-24.9, adult: Secondary | ICD-10-CM | POA: Diagnosis not present

## 2016-11-07 ENCOUNTER — Emergency Department (HOSPITAL_COMMUNITY)
Admission: EM | Admit: 2016-11-07 | Discharge: 2016-11-08 | Disposition: A | Discharge status: Home / Self Care | Payer: BLUE CROSS/BLUE SHIELD | Attending: Emergency Medicine | Admitting: Emergency Medicine

## 2016-11-07 ENCOUNTER — Encounter (HOSPITAL_COMMUNITY): Payer: Self-pay | Admitting: *Deleted

## 2016-11-07 DIAGNOSIS — M7989 Other specified soft tissue disorders: Secondary | ICD-10-CM | POA: Insufficient documentation

## 2016-11-07 DIAGNOSIS — R51 Headache: Secondary | ICD-10-CM | POA: Diagnosis not present

## 2016-11-07 DIAGNOSIS — M25512 Pain in left shoulder: Secondary | ICD-10-CM | POA: Diagnosis not present

## 2016-11-07 DIAGNOSIS — M549 Dorsalgia, unspecified: Secondary | ICD-10-CM | POA: Diagnosis not present

## 2016-11-07 DIAGNOSIS — R930 Abnormal findings on diagnostic imaging of skull and head, not elsewhere classified: Secondary | ICD-10-CM | POA: Diagnosis not present

## 2016-11-07 DIAGNOSIS — M79602 Pain in left arm: Secondary | ICD-10-CM | POA: Diagnosis not present

## 2016-11-07 DIAGNOSIS — Z79899 Other long term (current) drug therapy: Secondary | ICD-10-CM | POA: Insufficient documentation

## 2016-11-07 DIAGNOSIS — Z87891 Personal history of nicotine dependence: Secondary | ICD-10-CM | POA: Insufficient documentation

## 2016-11-07 DIAGNOSIS — Z7982 Long term (current) use of aspirin: Secondary | ICD-10-CM | POA: Insufficient documentation

## 2016-11-07 HISTORY — DX: Personal history of other venous thrombosis and embolism: Z86.718

## 2016-11-07 NOTE — ED Triage Notes (Signed)
Pt reports left shoulder pain that started yesterday. Pt says some swelling noted to the right side. Pt w/ a hx of clots.

## 2016-11-08 ENCOUNTER — Emergency Department (HOSPITAL_COMMUNITY): Payer: BLUE CROSS/BLUE SHIELD

## 2016-11-08 DIAGNOSIS — R930 Abnormal findings on diagnostic imaging of skull and head, not elsewhere classified: Secondary | ICD-10-CM | POA: Diagnosis not present

## 2016-11-08 DIAGNOSIS — M25512 Pain in left shoulder: Secondary | ICD-10-CM | POA: Diagnosis not present

## 2016-11-08 MED ORDER — CYCLOBENZAPRINE HCL 10 MG PO TABS: 10.0000 mg | ORAL_TABLET | Freq: Three times a day (TID) | ORAL | 0 refills | Status: DC | PRN

## 2016-11-08 MED ORDER — CYCLOBENZAPRINE HCL 10 MG PO TABS
10.0000 mg | ORAL_TABLET | Freq: Once | ORAL | Status: AC
  Administered 2016-11-08: 10 mg via ORAL
  Filled 2016-11-08: qty 1

## 2016-11-08 NOTE — ED Provider Notes (Signed)
[facility]Y DEPT Provider Note   CSN: [id number] Arrival date & time: 11/07/16  2343   By signing my name below, I, Clarisse GougeXavier Herndon, attest that this documentation has been prepared under the direction and in the presence of No att. providers found. Electronically signed, Clarisse GougeXavier Herndon, ED Scribe. 11/08/16. 5:13 AM.   Time seen 12:19 AM  History   Chief Complaint Chief Complaint  Patient presents with  . Shoulder Pain   The history is provided by the patient and the spouse. No language interpreter was used.    HPI Comments: Cathy Simmons is a 50 y.o. female who presents to the Emergency Department complaining of constant, gradually improving, moderate left shoulder pain That started yesterday, December 10. She states the pain radiates into her left back. She also complains of some lower back pain that started earlier this evening. She also states she's noted some swelling in the right side of her neck and points to the supraclavicular area.   She states her pain is exacerbated with movement and deep breathing. She denies fever, numbness for extremities although she states sometimes she has tingling in her right foot. She denies any visual changes. She denies any change in her activity or injury. Patient is right-handed.  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 She is currently on xarelto and one baby aspirin a day for that. She further notes that her first episode was accompanied by a severe headache and the second was accompanied by head and neck swelling but no headache and she points to her left lateral posterior neck as to the location of the swelling.  She states she saw a hematologist 10/29/2016 to look for any underlying cause of the thrombosis and her lupus tests were normal. Patient is concerned that her symptoms tonight are related to that.   FMHx  blood  clots in the legs for her mother, a heart aneurysm from her grandmother, stroke from her father and she is an only child.   Pt unemployed since October 2017 and right handed.  Pt is a non smoker. She denies headache, fever, confusion, visual disturbances, and activity change.   [place] Dr Reuel Boomaniel in [place]   Past Medical History:  Diagnosis Date  . Anxiety   . Family history of anesthesia complication    mother difficult to wake  . GERD (gastroesophageal reflux disease)   . History of blood clots   . Pneumonia    15 years ago    There are no active problems to display for this patient.   Past Surgical History:  Procedure Laterality Date  . BREAST SURGERY  breat biopsy x2 last 1995  . DILATION AND CURETTAGE OF UTERUS  x2 1984 and 1991  . excision left ganglion cyst Left   . FOREIGN BODY REMOVAL Right 01/29/2013   Procedure: EXCISION FOREIGN BODY RIGHT SMALL FINGER;  Surgeon: Nicki ReaperGary R Kuzma, MD;  Location: [facility];  Service: Orthopedics;  Laterality: Right;  . removal spleen  2000    OB History    No data available       Home Medications    Prior to Admission medications   Medication Sig Start Date End Date Taking? Authorizing Provider  ALPRAZolam (XANAX) 0.25 MG tablet Take 0.25 mg by mouth 3 (three) times daily.   Yes Historical Provider, MD  aspirin EC 81 MG tablet Take  81 mg by mouth daily.   Yes Historical Provider, MD  cetirizine (ZYRTEC) 10 MG tablet Take 10 mg by mouth at bedtime.    Yes Historical Provider, MD  Cholecalciferol (VITAMIN D-3) 1000 units CAPS Take 1,000 Units by mouth.   Yes Historical Provider, MD  dexlansoprazole (DEXILANT) 60 MG capsule Take 60 mg by mouth every morning.   Yes Historical Provider, MD  FLUoxetine (PROZAC) 40 MG capsule Take 40 mg by mouth every evening.   Yes Historical Provider, MD  levonorgestrel-ethinyl estradiol (NORDETTE) 0.15-30 MG-MCG tablet Take 1 tablet by mouth every evening.   Yes Historical Provider, MD    liothyronine (CYTOMEL) 25 MCG tablet Take by mouth daily.   Yes Historical Provider, MD  omeprazole (PRILOSEC) 20 MG capsule Take 20 mg by mouth daily.   Yes Historical Provider, MD  ranitidine (ZANTAC) 150 MG tablet Take 150 mg by mouth daily.   Yes Historical Provider, MD  rivaroxaban (XARELTO) 20 MG TABS tablet Take 20 mg by mouth daily with supper.   Yes Historical Provider, MD  vitamin B-12 (CYANOCOBALAMIN) 1000 MCG tablet Take 1,000 mcg by mouth daily.   Yes Historical Provider, MD  busPIRone (BUSPAR) 15 MG tablet Take 15 mg by mouth 3 (three) times daily.    Historical Provider, MD  cephALEXin (KEFLEX) 500 MG capsule Take 1 capsule (500 mg total) by mouth 4 (four) times daily. 12/04/13   Loren Racer, MD  cyclobenzaprine (FLEXERIL) 10 MG tablet Take 1 tablet (10 mg total) by mouth 3 (three) times daily as needed (muscle soreness). 11/08/16   Devoria Albe, MD  ibuprofen (ADVIL,MOTRIN) 600 MG tablet Take 1 tablet (600 mg total) by mouth every 6 (six) hours as needed. 12/04/13   Loren Racer, MD  phenazopyridine (PYRIDIUM) 200 MG tablet Take 1 tablet (200 mg total) by mouth 3 (three) times daily as needed for pain. 12/04/13   Loren Racer, MD    Family History No family history on file.  Social History Social History  Substance Use Topics  . Smoking status: Former Smoker    Packs/day: 0.50    Quit date: 01/30/1999  . Smokeless tobacco: Never Used  . Alcohol use No  Out of work since October 2017 Lives at home Lives with spouse   Allergies   Bactrim [sulfamethoxazole-trimethoprim] and Cymbalta [duloxetine hcl]   Review of Systems Review of Systems  Constitutional: Negative for activity change and fever.  Eyes: Negative for visual disturbance.  Musculoskeletal: Positive for arthralgias, back pain and joint swelling. Negative for gait problem.  Neurological: Positive for numbness. Negative for headaches.  Psychiatric/Behavioral: Negative for confusion.  All other systems  reviewed and are negative.    Physical Exam Updated Vital Signs BP 106/59 (BP Location: Left Arm)   Pulse 75   Temp 97.6 F (36.4 C) (Oral)   Resp 18   Ht 5\' 2"  (1.575 m)   Wt 135 lb (61.2 kg)   LMP 11/03/2016   SpO2 95%   BMI 24.69 kg/m   Vital signs normal    Physical Exam  Constitutional: She is oriented to person, place, and time. She appears well-developed and well-nourished.  HENT:  Head: Normocephalic.  Eyes: EOM are normal.  Neck: Normal range of motion.  Pulmonary/Chest: Effort normal.  Abdominal: She exhibits no distension.  Musculoskeletal: Normal range of motion.  Fullness in the supraclavicular area on the right. No lymphadenopathy. Diffuse swelling to the left shoulder. Pain with palpation and ROM, also tenderness around scapula.  Neurological: She  is alert and oriented to person, place, and time.  Psychiatric: She has a normal mood and affect.  Nursing note and vitals reviewed.    ED Treatments / Results  DIAGNOSTIC STUDIES: Oxygen Saturation is 95% on RA, adequate by my interpretation.     Radiology Ct Head Wo Contrast  Result Date: 11/08/2016 CLINICAL DATA:  History of superior sagittal sinus thrombosis, on blood thinners. Question recurrence. EXAM: CT HEAD WITHOUT CONTRAST TECHNIQUE: Contiguous axial images were obtained from the base of the skull through the vertex without intravenous contrast. COMPARISON:  None available. FINDINGS: Brain: Cerebral volume normal. No acute intracranial hemorrhage. No findings to suggest acute large vessel territory infarct. No mass lesion, midline shift, or mass effect. No hydrocephalus. No extra-axial fluid collection. Vascular: No hyperdense vessel. No findings to suggest dural sinus thrombosis on this noncontrast examination. Skull: Scalp soft tissues within normal limits.  Calvarium intact. Sinuses/Orbits: Globes and orbital soft tissues demonstrate no acute abnormality. Paranasal sinuses are clear. No mastoid  effusion. IMPRESSION: Negative head CT. No findings to suggest dural sinus thrombosis on this noncontrast head CT. Electronically Signed   By: Rise Mu M.D.   On: 11/08/2016 02:00   Dg Shoulder Left  Result Date: 11/08/2016 CLINICAL DATA:  Left shoulder pain radiating to left side of back. Cramping on anti coagulation. EXAM: LEFT SHOULDER - 2+ VIEW COMPARISON:  None. FINDINGS: There is no evidence of fracture or dislocation. There is no evidence of arthropathy or other focal bone abnormality. On the axillary view there is an ovoid 10 x 5 mm soft tissue nodule possibly representing a lymph node in the axilla or skin lesion. Biceps tendinopathy believed less likely. IMPRESSION: No acute osseous abnormality. Ovoid soft tissue nodule anteriorly on the axillary view may reflect a small lymph node versus skin lesion. Electronically Signed   By: Tollie Eth M.D.   On: 11/08/2016 01:58    Procedures Procedures (including critical care time)  Medications Ordered in ED Medications  cyclobenzaprine (FLEXERIL) tablet 10 mg (10 mg Oral Given 11/08/16 0501)     Initial Impression / Assessment and Plan / ED Course  I have reviewed the triage vital signs and the nursing notes.  Pertinent labs & imaging results that were available during my care of the patient were reviewed by me and considered in my medical decision making (see chart for details).  Clinical Course    COORDINATION OF CARE: 12:22 AM Discussed treatment plan with pt at bedside and pt agreed to plan.  CT of the head was done and x-ray of the left shoulder was done.  I was unable to access patient's prior x-ray studies in PACs. However in care everywhere I saw where she had a CT of the head and MRI with contrast in October 2017 to diagnose her current sagittal sinus thrombosis.  Patient discussed with the neurologist Dr Otelia Limes who states he does not feel like MRI is going to be helpful because he would expect to still see some  evidence of the thrombosis since she was just diagnosed in October. He states if the MRI is negative it will not change therapy, and if the MRI still shows a thrombosis being present it would not change the therapy. He states without her having any acute neurological symptoms this is reassuring that this is not the cause of her shoulder pain. I also feel like her shoulder pain is musculoskeletal.  This information was relayed to the patient. Since she's on the blood thinner she  could not be placed on anti-inflammatory. She was started on a muscle relaxer. She can use ice and heat on her shoulder. She was referred to orthopedics if her pain is not improving. She was advised if she developed any acute neurological symptoms such as numbness or weakness in her extremities or face, difficulty speaking etc. she should return to the emergency room immediately.  Final Clinical Impressions(s) / ED Diagnoses   Final diagnoses:  Musculoskeletal pain of left upper extremity  Acute pain of left shoulder    New Prescriptions Discharge Medication List as of 11/08/2016  4:49 AM    START taking these medications   Details  cyclobenzaprine (FLEXERIL) 10 MG tablet Take 1 tablet (10 mg total) by mouth 3 (three) times daily as needed (muscle soreness)., Starting Mon 11/08/2016, Print        Plan discharge  Devoria AlbeIva Veeda Virgo, MD, FACEP  I personally performed the services described in this documentation, which was scribed in my presence. The recorded information has been reviewed and considered.  Devoria AlbeIva Maximilliano Kersh, MD, Concha PyoFACEP    Michaela Shankel, MD 11/08/16 [phone]

## 2016-11-08 NOTE — ED Notes (Signed)
Dr Knapp in to assess 

## 2016-11-08 NOTE — ED Notes (Signed)
Dr Kirtland BouchardK in to reassess and discuss results with pt and spouse

## 2016-11-08 NOTE — ED Notes (Signed)
Patient transported to CT 

## 2016-11-08 NOTE — Discharge Instructions (Signed)
Use ice and heat over the painful areas. Watch for swelling in your right neck, if you feel it is getting worse or you feel a lump in that area you should let your doctor know right away. You should be rechecked if you get a headache, numbness or weakness in an arm or leg or even face, have difficulty talking or walking. Continue your blood thinners.

## 2016-11-08 NOTE — ED Notes (Signed)
Pt reports a history of blood clots followed in [place] - she was last seen on 12/3 and is currently on Xarelto and a baby asa daily. She reports swelling to her R neck as well as back pain and L shoulder pain in the last 24 hours and feels that it may be related to clots.

## 2016-11-08 NOTE — ED Notes (Signed)
Pt  Informed of delay in that the ED physician was consulting with another regarding her care-

## 2016-11-08 NOTE — ED Notes (Signed)
From CT 

## 2016-11-25 DIAGNOSIS — J019 Acute sinusitis, unspecified: Secondary | ICD-10-CM | POA: Diagnosis not present

## 2016-11-25 DIAGNOSIS — Z6824 Body mass index (BMI) 24.0-24.9, adult: Secondary | ICD-10-CM | POA: Diagnosis not present

## 2016-12-01 DIAGNOSIS — I636 Cerebral infarction due to cerebral venous thrombosis, nonpyogenic: Secondary | ICD-10-CM | POA: Diagnosis not present

## 2016-12-01 DIAGNOSIS — F331 Major depressive disorder, recurrent, moderate: Secondary | ICD-10-CM | POA: Diagnosis not present

## 2016-12-01 DIAGNOSIS — M1611 Unilateral primary osteoarthritis, right hip: Secondary | ICD-10-CM | POA: Diagnosis not present

## 2016-12-01 DIAGNOSIS — E782 Mixed hyperlipidemia: Secondary | ICD-10-CM | POA: Diagnosis not present

## 2016-12-01 DIAGNOSIS — J301 Allergic rhinitis due to pollen: Secondary | ICD-10-CM | POA: Diagnosis not present

## 2016-12-07 DIAGNOSIS — I636 Cerebral infarction due to cerebral venous thrombosis, nonpyogenic: Secondary | ICD-10-CM | POA: Diagnosis not present

## 2016-12-07 DIAGNOSIS — J301 Allergic rhinitis due to pollen: Secondary | ICD-10-CM | POA: Diagnosis not present

## 2016-12-07 DIAGNOSIS — E782 Mixed hyperlipidemia: Secondary | ICD-10-CM | POA: Diagnosis not present

## 2016-12-07 DIAGNOSIS — F331 Major depressive disorder, recurrent, moderate: Secondary | ICD-10-CM | POA: Diagnosis not present

## 2016-12-09 DIAGNOSIS — M25551 Pain in right hip: Secondary | ICD-10-CM | POA: Diagnosis not present

## 2017-01-07 DIAGNOSIS — K219 Gastro-esophageal reflux disease without esophagitis: Secondary | ICD-10-CM | POA: Diagnosis not present

## 2017-01-07 DIAGNOSIS — Z9189 Other specified personal risk factors, not elsewhere classified: Secondary | ICD-10-CM | POA: Diagnosis not present

## 2017-01-07 DIAGNOSIS — E782 Mixed hyperlipidemia: Secondary | ICD-10-CM | POA: Diagnosis not present

## 2017-01-07 DIAGNOSIS — F331 Major depressive disorder, recurrent, moderate: Secondary | ICD-10-CM | POA: Diagnosis not present

## 2017-01-14 DIAGNOSIS — F331 Major depressive disorder, recurrent, moderate: Secondary | ICD-10-CM | POA: Diagnosis not present

## 2017-01-14 DIAGNOSIS — J301 Allergic rhinitis due to pollen: Secondary | ICD-10-CM | POA: Diagnosis not present

## 2017-01-14 DIAGNOSIS — E782 Mixed hyperlipidemia: Secondary | ICD-10-CM | POA: Diagnosis not present

## 2017-01-14 DIAGNOSIS — I636 Cerebral infarction due to cerebral venous thrombosis, nonpyogenic: Secondary | ICD-10-CM | POA: Diagnosis not present

## 2017-03-09 DIAGNOSIS — J320 Chronic maxillary sinusitis: Secondary | ICD-10-CM | POA: Diagnosis not present

## 2017-03-09 DIAGNOSIS — Z87891 Personal history of nicotine dependence: Secondary | ICD-10-CM | POA: Diagnosis not present

## 2017-03-09 DIAGNOSIS — R51 Headache: Secondary | ICD-10-CM | POA: Diagnosis not present

## 2017-05-09 DIAGNOSIS — F331 Major depressive disorder, recurrent, moderate: Secondary | ICD-10-CM | POA: Diagnosis not present

## 2017-05-09 DIAGNOSIS — E782 Mixed hyperlipidemia: Secondary | ICD-10-CM | POA: Diagnosis not present

## 2017-05-09 DIAGNOSIS — S9031XA Contusion of right foot, initial encounter: Secondary | ICD-10-CM | POA: Diagnosis not present

## 2017-05-09 DIAGNOSIS — I636 Cerebral infarction due to cerebral venous thrombosis, nonpyogenic: Secondary | ICD-10-CM | POA: Diagnosis not present

## 2017-05-09 DIAGNOSIS — K219 Gastro-esophageal reflux disease without esophagitis: Secondary | ICD-10-CM | POA: Diagnosis not present

## 2017-06-12 DIAGNOSIS — M542 Cervicalgia: Secondary | ICD-10-CM | POA: Diagnosis not present

## 2017-06-12 DIAGNOSIS — Z86718 Personal history of other venous thrombosis and embolism: Secondary | ICD-10-CM | POA: Diagnosis not present

## 2017-06-12 DIAGNOSIS — Z91048 Other nonmedicinal substance allergy status: Secondary | ICD-10-CM | POA: Diagnosis not present

## 2017-06-12 DIAGNOSIS — R51 Headache: Secondary | ICD-10-CM | POA: Diagnosis not present

## 2017-06-12 DIAGNOSIS — Z79899 Other long term (current) drug therapy: Secondary | ICD-10-CM | POA: Diagnosis not present

## 2017-06-12 DIAGNOSIS — G08 Intracranial and intraspinal phlebitis and thrombophlebitis: Secondary | ICD-10-CM | POA: Diagnosis not present

## 2017-06-12 DIAGNOSIS — Z888 Allergy status to other drugs, medicaments and biological substances status: Secondary | ICD-10-CM | POA: Diagnosis not present

## 2017-06-12 DIAGNOSIS — Z882 Allergy status to sulfonamides status: Secondary | ICD-10-CM | POA: Diagnosis not present

## 2017-06-12 DIAGNOSIS — R9082 White matter disease, unspecified: Secondary | ICD-10-CM | POA: Diagnosis not present

## 2017-06-12 DIAGNOSIS — Z87891 Personal history of nicotine dependence: Secondary | ICD-10-CM | POA: Diagnosis not present

## 2017-06-12 DIAGNOSIS — Z7901 Long term (current) use of anticoagulants: Secondary | ICD-10-CM | POA: Diagnosis not present

## 2017-06-12 DIAGNOSIS — Z7982 Long term (current) use of aspirin: Secondary | ICD-10-CM | POA: Diagnosis not present

## 2017-06-12 DIAGNOSIS — J328 Other chronic sinusitis: Secondary | ICD-10-CM | POA: Diagnosis not present

## 2017-06-12 DIAGNOSIS — I82891 Chronic embolism and thrombosis of other specified veins: Secondary | ICD-10-CM | POA: Diagnosis not present

## 2017-06-12 DIAGNOSIS — Z887 Allergy status to serum and vaccine status: Secondary | ICD-10-CM | POA: Diagnosis not present

## 2017-06-13 DIAGNOSIS — J328 Other chronic sinusitis: Secondary | ICD-10-CM | POA: Diagnosis not present

## 2017-06-13 DIAGNOSIS — R9082 White matter disease, unspecified: Secondary | ICD-10-CM | POA: Diagnosis not present

## 2017-06-13 DIAGNOSIS — Z86718 Personal history of other venous thrombosis and embolism: Secondary | ICD-10-CM | POA: Diagnosis not present

## 2017-06-13 DIAGNOSIS — R51 Headache: Secondary | ICD-10-CM | POA: Diagnosis not present

## 2017-08-18 DIAGNOSIS — Z23 Encounter for immunization: Secondary | ICD-10-CM | POA: Diagnosis not present

## 2017-10-06 DIAGNOSIS — J301 Allergic rhinitis due to pollen: Secondary | ICD-10-CM | POA: Diagnosis not present

## 2017-10-06 DIAGNOSIS — E782 Mixed hyperlipidemia: Secondary | ICD-10-CM | POA: Diagnosis not present

## 2017-10-06 DIAGNOSIS — F331 Major depressive disorder, recurrent, moderate: Secondary | ICD-10-CM | POA: Diagnosis not present

## 2017-10-06 DIAGNOSIS — I636 Cerebral infarction due to cerebral venous thrombosis, nonpyogenic: Secondary | ICD-10-CM | POA: Diagnosis not present

## 2017-10-18 DIAGNOSIS — R1011 Right upper quadrant pain: Secondary | ICD-10-CM | POA: Diagnosis not present

## 2017-10-18 DIAGNOSIS — Z9081 Acquired absence of spleen: Secondary | ICD-10-CM | POA: Diagnosis not present

## 2017-11-24 DIAGNOSIS — R509 Fever, unspecified: Secondary | ICD-10-CM | POA: Diagnosis not present

## 2017-11-24 DIAGNOSIS — Z6825 Body mass index (BMI) 25.0-25.9, adult: Secondary | ICD-10-CM | POA: Diagnosis not present

## 2017-11-24 DIAGNOSIS — J0101 Acute recurrent maxillary sinusitis: Secondary | ICD-10-CM | POA: Diagnosis not present

## 2018-01-07 DIAGNOSIS — Z6825 Body mass index (BMI) 25.0-25.9, adult: Secondary | ICD-10-CM | POA: Diagnosis not present

## 2018-01-07 DIAGNOSIS — J206 Acute bronchitis due to rhinovirus: Secondary | ICD-10-CM | POA: Diagnosis not present

## 2018-01-07 DIAGNOSIS — J069 Acute upper respiratory infection, unspecified: Secondary | ICD-10-CM | POA: Diagnosis not present

## 2018-01-27 ENCOUNTER — Encounter: Payer: Self-pay | Admitting: Family Medicine

## 2018-01-27 ENCOUNTER — Ambulatory Visit: Payer: BLUE CROSS/BLUE SHIELD | Admitting: Family Medicine

## 2018-01-27 VITALS — BP 130/84 | HR 90 | Temp 98.0°F | Ht 62.0 in | Wt 142.4 lb

## 2018-01-27 DIAGNOSIS — J309 Allergic rhinitis, unspecified: Secondary | ICD-10-CM | POA: Diagnosis not present

## 2018-01-27 DIAGNOSIS — L989 Disorder of the skin and subcutaneous tissue, unspecified: Secondary | ICD-10-CM | POA: Diagnosis not present

## 2018-01-27 MED ORDER — LEVOCETIRIZINE DIHYDROCHLORIDE 5 MG PO TABS: 5.0000 mg | ORAL_TABLET | Freq: Every evening | ORAL | 5 refills | Status: DC

## 2018-01-27 MED ORDER — METHYLPREDNISOLONE ACETATE 80 MG/ML IJ SUSP
80.0000 mg | Freq: Once | INTRAMUSCULAR | Status: AC
  Administered 2018-01-27: 80 mg via INTRAMUSCULAR

## 2018-01-27 MED ORDER — FLUTICASONE PROPIONATE 50 MCG/ACT NA SUSP: 2.0000 | Freq: Every day | NASAL | 2 refills | Status: DC

## 2018-01-27 NOTE — Progress Notes (Signed)
Pre visit review using our clinic review tool, if applicable. No additional management support is needed unless otherwise documented below in the visit note. 

## 2018-01-27 NOTE — Patient Instructions (Addendum)
Claritin (loratadine), Allegra (fexofenadine), Zyrtec (cetirizine); these are listed in order from weakest to strongest. Generic, and therefore cheaper, options are in the parentheses.   Flonase (fluticasone); nasal spray that is over the counter. 2 sprays each nostril, once daily. Aim towards the same side eye when you spray.  There are available OTC, and the generic versions, which may be cheaper, are in parentheses. Show this to a pharmacist if you have trouble finding any of these items.  If the skin area in question changes, let us know.   Let us know if you need anything.

## 2018-01-27 NOTE — Progress Notes (Signed)
Chief Complaint  Patient presents with  . Establish Care       New Patient Visit SUBJECTIVE: HPI: Cathy Simmons is an 52 y.o.female who is being seen for establishing care.  1 mo ago, noticed a black lesion on her R breast, looks different from other areas of skin. It has not changed since then. No pain, drainage, itching, scaling. No personal hx of skin cancer. MGF had skin cancer. +sun exposure. Does have a tanning bed at home.   Duration: 1 month  Associated symptoms: sinus congestion, itchy watery eyes, ear pain and cough initially Denies: sinus pain, ear drainage, sore throat, wheezing, shortness of breath, myalgia and fevers Treatment to date: Zyrtec Sick contacts: No    Allergies  Allergen Reactions  . Bactrim [Sulfamethoxazole-Trimethoprim] Other (See Comments)    Causes a yeast infection  . Cymbalta [Duloxetine Hcl] Anxiety    Past Medical History:  Diagnosis Date  . Anxiety   . Family history of anesthesia complication    mother difficult to wake  . GERD (gastroesophageal reflux disease)   . History of blood clots   . Pneumonia    15 years ago   Past Surgical History:  Procedure Laterality Date  . BREAST SURGERY  breat biopsy x2 last 1995  . DILATION AND CURETTAGE OF UTERUS  x2 1984 and 1991  . excision left ganglion cyst Left   . FOREIGN BODY REMOVAL Right 01/29/2013   Procedure: EXCISION FOREIGN BODY RIGHT SMALL FINGER;  Surgeon: Nicki Reaper, MD;  Location: [facility];  Service: Orthopedics;  Laterality: Right;  . removal spleen  2000   Social History   Socioeconomic History  . Marital status: Married  Tobacco Use  . Smoking status: Former Smoker    Packs/day: 0.50    Last attempt to quit: 01/30/1999    Years since quitting: 19.0  . Smokeless tobacco: Never Used  Substance and Sexual Activity  . Alcohol use: No  . Drug use: No  . Sexual activity: Yes    Birth control/protection: Pill   Family History  Problem Relation Age of  Onset  . Stroke Mother      Current Outpatient Medications:  .  ALPRAZolam (XANAX) 0.25 MG tablet, Take 0.25 mg by mouth 3 (three) times daily., Disp: , Rfl:  .  Cholecalciferol (VITAMIN D-3) 1000 units CAPS, Take 1,000 Units by mouth., Disp: , Rfl:  .  esomeprazole (NEXIUM) 20 MG capsule, Take 20 mg by mouth daily at 12 noon., Disp: , Rfl:  .  FLUoxetine (PROZAC) 40 MG capsule, Take 40 mg by mouth every evening., Disp: , Rfl:  .  liothyronine (CYTOMEL) 25 MCG tablet, Take by mouth daily., Disp: , Rfl:  .  rivaroxaban (XARELTO) 20 MG TABS tablet, Take 20 mg by mouth daily with supper., Disp: , Rfl:  .  vitamin B-12 (CYANOCOBALAMIN) 1000 MCG tablet, Take 1,000 mcg by mouth daily., Disp: , Rfl:  .  fluticasone (FLONASE) 50 MCG/ACT nasal spray, Place 2 sprays into both nostrils daily., Disp: 16 g, Rfl: 2 .  levocetirizine (XYZAL) 5 MG tablet, Take 1 tablet (5 mg total) by mouth every evening., Disp: 30 tablet, Rfl: 5  No LMP recorded.  ROS Cardiovascular: Denies chest pain  Respiratory: Denies dyspnea   OBJECTIVE: BP 130/84 (BP Location: Right Arm, Patient Position: Sitting, Cuff Size: Normal)   Pulse 90   Temp 98 F (36.7 C) (Oral)   Ht 5\' 2"  (1.575 m)   Wt 142 lb  6 oz (64.6 kg)   SpO2 96%   BMI 26.04 kg/m   Constitutional: -  VS reviewed -  Well developed, well nourished, appears stated age -  No apparent distress  Psychiatric: -  Oriented to person, place, and time -  Memory intact -  Affect and mood normal -  Fluent conversation, good eye contact -  Judgment and insight age appropriate  Eye: -  Conjunctivae clear, no discharge -  Pupils symmetric, round, reactive to light  ENMT: -  MMM    Pharynx moist, no exudate, no erythema  Neck: -  No gross swelling, no palpable masses -  Thyroid midline, not enlarged, mobile, no palpable masses  Cardiovascular: -  RRR -  No LE edema  Respiratory: -  Normal respiratory effort, no accessory muscle use, no retraction -  Breath  sounds equal, no wheezes, no ronchi, no crackles  Gastrointestinal: -  Bowel sounds normal -  No tenderness, no distention, no guarding, no masses  Neurological:  -  CN II - XII grossly intact -  Sensation grossly intact to light touch, equal bilaterally  Musculoskeletal: -  No clubbing, no cyanosis -  Gait normal  Skin: -  See below -  Warm and dry to palpation; no ttp or fluctuance   Media Information    ASSESSMENT/PLAN: Allergic rhinitis, unspecified seasonality, unspecified trigger - Plan: methylPREDNISolone acetate (DEPO-MEDROL) injection 80 mg  Skin lesion  Patient instructed to sign release of records form from her previous PCP. Tx for allergies. Change Zyrtec to Xyzal. Start INCS. Steroid shot today. Skin lesion appears to be epidermal cyst. Let us know if there are changes.  Patient should return at earliest convenience for CPE. The patient voiced understanding and agreement to the plan.   Jilda Rocheicholas Paul BenedictWendling, DO 01/27/18  10:07 AM

## 2018-02-06 ENCOUNTER — Encounter: Payer: Self-pay | Admitting: Family Medicine

## 2018-02-06 ENCOUNTER — Ambulatory Visit (INDEPENDENT_AMBULATORY_CARE_PROVIDER_SITE_OTHER): Payer: BLUE CROSS/BLUE SHIELD | Admitting: Family Medicine

## 2018-02-06 VITALS — BP 104/70 | HR 73 | Temp 98.0°F | Ht 62.0 in | Wt 138.0 lb

## 2018-02-06 DIAGNOSIS — Z114 Encounter for screening for human immunodeficiency virus [HIV]: Secondary | ICD-10-CM

## 2018-02-06 DIAGNOSIS — Q8901 Asplenia (congenital): Secondary | ICD-10-CM | POA: Diagnosis not present

## 2018-02-06 DIAGNOSIS — Z0000 Encounter for general adult medical examination without abnormal findings: Secondary | ICD-10-CM | POA: Diagnosis not present

## 2018-02-06 DIAGNOSIS — F419 Anxiety disorder, unspecified: Secondary | ICD-10-CM

## 2018-02-06 DIAGNOSIS — Z23 Encounter for immunization: Secondary | ICD-10-CM

## 2018-02-06 LAB — CBC
HCT: 39.4 % (ref 36.0–46.0)
Hemoglobin: 13 g/dL (ref 12.0–15.0)
MCHC: 33.1 g/dL (ref 30.0–36.0)
MCV: 87.8 fl (ref 78.0–100.0)
Platelets: 315 10*3/uL (ref 150.0–400.0)
RBC: 4.49 Mil/uL (ref 3.87–5.11)
RDW: 15.7 % — AB (ref 11.5–15.5)
WBC: 9.2 10*3/uL (ref 4.0–10.5)

## 2018-02-06 LAB — COMPREHENSIVE METABOLIC PANEL
ALK PHOS: 92 U/L (ref 39–117)
ALT: 16 U/L (ref 0–35)
AST: 16 U/L (ref 0–37)
Albumin: 4 g/dL (ref 3.5–5.2)
BUN: 17 mg/dL (ref 6–23)
CO2: 28 mEq/L (ref 19–32)
Calcium: 9.9 mg/dL (ref 8.4–10.5)
Chloride: 105 mEq/L (ref 96–112)
Creatinine, Ser: 0.7 mg/dL (ref 0.40–1.20)
GFR: 93.08 mL/min (ref >60.00)
GLUCOSE: 97 mg/dL (ref 70–99)
POTASSIUM: 4.2 meq/L (ref 3.5–5.1)
SODIUM: 141 meq/L (ref 135–145)
TOTAL PROTEIN: 7.6 g/dL (ref 6.0–8.3)
Total Bilirubin: 0.2 mg/dL (ref 0.2–1.2)

## 2018-02-06 LAB — LIPID PANEL
Cholesterol: 229 mg/dL — ABNORMAL HIGH (ref 0–200)
HDL: 58.6 mg/dL (ref >39.00)
LDL CALC: 156 mg/dL — AB (ref 0–99)
NonHDL: 169.95
Total CHOL/HDL Ratio: 4
Triglycerides: 71 mg/dL (ref 0.0–149.0)
VLDL: 14.2 mg/dL (ref 0.0–40.0)

## 2018-02-06 MED ORDER — PNEUMOCOCCAL 13-VAL CONJ VACC IM SUSP: 0.5000 mL | INTRAMUSCULAR | Status: DC

## 2018-02-06 NOTE — Progress Notes (Signed)
Chief Complaint  Patient presents with  . Annual Exam     Well Woman Cathy Simmons is here for a complete physical.   Her last physical was >1 year ago.  Current diet: in general, a "healthy" diet. Current exercise: none. Weight is stable and she denies daytime fatigue. No LMP recorded.  Seatbelt? Yes  Health Maintenance Pap/HPV- Yes - 1 yr ago; neg cotesting Mammogram- Yes; 08/2017 Tetanus- No HIV screening- No  Past Medical History:  Diagnosis Date  . Anxiety   . Depression   . Family history of anesthesia complication    mother difficult to wake  . GERD (gastroesophageal reflux disease)   . History of blood clots   . Hyperlipidemia   . Pneumonia    15 years ago     Past Surgical History:  Procedure Laterality Date  . BREAST SURGERY  breat biopsy x2 last 1995  . DILATION AND CURETTAGE OF UTERUS  x2 1984 and 1991  . excision left ganglion cyst Left   . FOREIGN BODY REMOVAL Right 01/29/2013   Procedure: EXCISION FOREIGN BODY RIGHT SMALL FINGER;  Surgeon: Nicki Reaper, MD;  Location: [facility];  Service: Orthopedics;  Laterality: Right;  . removal spleen  2000    Medications  Current Outpatient Medications on File Prior to Visit  Medication Sig Dispense Refill  . ALPRAZolam (XANAX) 0.25 MG tablet Take 0.25 mg by mouth 3 (three) times daily.    . Cholecalciferol (VITAMIN D-3) 1000 units CAPS Take 1,000 Units by mouth.    . esomeprazole (NEXIUM) 20 MG capsule Take 20 mg by mouth daily at 12 noon.    Marland Kitchen FLUoxetine (PROZAC) 40 MG capsule Take 40 mg by mouth every evening.    . fluticasone (FLONASE) 50 MCG/ACT nasal spray Place 2 sprays into both nostrils daily. 16 g 2  . levocetirizine (XYZAL) 5 MG tablet Take 1 tablet (5 mg total) by mouth every evening. 30 tablet 5  . liothyronine (CYTOMEL) 25 MCG tablet Take by mouth daily.    . rivaroxaban (XARELTO) 20 MG TABS tablet Take 20 mg by mouth daily with supper.    . vitamin B-12 (CYANOCOBALAMIN) 1000 MCG  tablet Take 1,000 mcg by mouth daily.      Allergies Allergies  Allergen Reactions  . Bactrim [Sulfamethoxazole-Trimethoprim] Other (See Comments)    Causes a yeast infection  . Cymbalta [Duloxetine Hcl] Anxiety    Review of Systems: Constitutional:  no fevers Eye:  no recent significant change in vision Ear/Nose/Mouth/Throat:  Ears:  no tinnitus or vertigo and no recent change in hearing, Nose/Mouth/Throat:  no complaints of nasal congestion, no sore throat Cardiovascular: no chest pain, no palpitations Respiratory:  no cough, +intermittent shortness of breath Gastrointestinal:  no abdominal pain, no change in bowel habits, no significant change in appetite, no nausea, vomiting, diarrhea, or constipation and no black or bloody stool GU:  Female: negative for dysuria, frequency, and incontinence; no abnormal bleeding, pelvic pain, or discharge Musculoskeletal/Extremities: +L lower back pain and L rib pain; otherwise no pain of the joints Integumentary (Skin/Breast):  no abnormal skin lesions reported Neurologic:  no headaches Endocrine:  denies fatigue Hematologic/Lymphatic:  no unexpected weight changes  Exam BP 104/70 (BP Location: Left Arm, Patient Position: Sitting, Cuff Size: Normal)   Pulse 73   Temp 98 F (36.7 C) (Oral)   Ht 5\' 2"  (1.575 m)   Wt 138 lb (62.6 kg)   SpO2 97%   BMI 25.24 kg/m  General:  well developed, well nourished, in no apparent distress Skin:  no significant moles, warts, or growths Head:  no masses, lesions, or tenderness Eyes:  pupils equal and round, sclera anicteric without injection Ears:  canals without lesions, TMs shiny without retraction, no obvious effusion, no erythema Nose:  nares patent, septum midline, mucosa normal, and no drainage or sinus tenderness Throat/Pharynx:  lips and gingiva without lesion; tongue and uvula midline; non-inflamed pharynx; no exudates or postnasal drainage Neck: neck supple without adenopathy, thyromegaly, or  masses Breasts:  Not done Thorax:  nontender Lungs:  clear to auscultation, breath sounds equal bilaterally, no respiratory distress Cardio:  regular rate and rhythm without murmurs, heart sounds without clicks or rubs, point of maximal impulse normal; no lifts, heaves, or thrills Abdomen:  abdomen soft, nontender; bowel sounds normal; no masses or organomegaly Genital: Defer to GYN Musculoskeletal: +TTP over lower chest wall, +TTP over L lumbar paraspinal msc; symmetrical muscle groups noted without atrophy or deformity Extremities:  no clubbing, cyanosis, or edema, no deformities, no skin discoloration Neuro:  gait normal; deep tendon reflexes normal and symmetric Psych: well oriented with normal range of affect and appropriate judgment/insight  Assessment and Plan  Well adult exam - Plan: Lipid panel, CBC, Comprehensive metabolic panel  Screening for HIV (human immunodeficiency virus) - Plan: HIV antibody  Need for tetanus booster - Plan: Tdap vaccine greater than or equal to 7yo IM  Need for vaccination against Streptococcus pneumoniae - Plan: Pneumococcal conjugate vaccine 13-valent, DISCONTINUED: pneumococcal 13-valent conjugate vaccine (PREVNAR 13) injection 0.5 mL  Asplenia  Anxiety   Well 52 y.o. female. Counseled on diet and exercise.  Stretches/exercises given for lower back, hopefully this can help with rib related pain.  PCV13 because she does not have a spleen, she states she did receive the PCV23 prior to her procedure to remove spleen. Other orders as above. Follow up in 3 mo for med check. The patient voiced understanding and agreement to the plan.  Jilda Rocheicholas Paul MarseillesWendling, DO 02/06/18 9:51 AM

## 2018-02-06 NOTE — Patient Instructions (Signed)
Aim to do some physical exertion for 150 minutes per week. This is typically divided into 5 days per week, 30 minutes per day. The activity should be enough to get your heart rate up. Anything is better than nothing if you have time constraints.  EXERCISES  RANGE OF MOTION (ROM) AND STRETCHING EXERCISES - Low Back Prain Most people with lower back pain will find that their symptoms get worse with excessive bending forward (flexion) or arching at the lower back (extension). The exercises that will help resolve your symptoms will focus on the opposite motion.  If you have pain, numbness or tingling which travels down into your buttocks, leg or foot, the goal of the therapy is for these symptoms to move closer to your back and eventually resolve. Sometimes, these leg symptoms will get better, but your lower back pain may worsen. This is often an indication of progress in your rehabilitation. Be very alert to any changes in your symptoms and the activities in which you participated in the 24 hours prior to the change. Sharing this information with your caregiver will allow him or her to most efficiently treat your condition. These exercises may help you when beginning to rehabilitate your injury. Your symptoms may resolve with or without further involvement from your physician, physical therapist or athletic trainer. While completing these exercises, remember:   Restoring tissue flexibility helps normal motion to return to the joints. This allows healthier, less painful movement and activity.  An effective stretch should be held for at least 30 seconds.  A stretch should never be painful. You should only feel a gentle lengthening or release in the stretched tissue. FLEXION RANGE OF MOTION AND STRETCHING EXERCISES:  STRETCH - Flexion, Single Knee to Chest   Lie on a firm bed or floor with both legs extended in front of you.  Keeping one leg in contact with the floor, bring your opposite knee to your  chest. Hold your leg in place by either grabbing behind your thigh or at your knee.  Pull until you feel a gentle stretch in your low back. Hold 30 seconds.  Slowly release your grasp and repeat the exercise with the opposite side. Repeat 2 times. Complete this exercise 3 times per week.   STRETCH - Flexion, Double Knee to Chest  Lie on a firm bed or floor with both legs extended in front of you.  Keeping one leg in contact with the floor, bring your opposite knee to your chest.  Tense your stomach muscles to support your back and then lift your other knee to your chest. Hold your legs in place by either grabbing behind your thighs or at your knees.  Pull both knees toward your chest until you feel a gentle stretch in your low back. Hold 30 seconds.  Tense your stomach muscles and slowly return one leg at a time to the floor. Repeat 2 times. Complete this exercise 3 times per week.   STRETCH - Low Trunk Rotation  Lie on a firm bed or floor. Keeping your legs in front of you, bend your knees so they are both pointed toward the ceiling and your feet are flat on the floor.  Extend your arms out to the side. This will stabilize your upper body by keeping your shoulders in contact with the floor.  Gently and slowly drop both knees together to one side until you feel a gentle stretch in your low back. Hold for 30 seconds.  Tense your stomach muscles  to support your lower back as you bring your knees back to the starting position. Repeat the exercise to the other side. Repeat 2 times. Complete this exercise at least 3 times per week.   EXTENSION RANGE OF MOTION AND FLEXIBILITY EXERCISES:  STRETCH - Extension, Prone on Elbows   Lie on your stomach on the floor, a bed will be too soft. Place your palms about shoulder width apart and at the height of your head.  Place your elbows under your shoulders. If this is too painful, stack pillows under your chest.  Allow your body to relax so  that your hips drop lower and make contact more completely with the floor.  Hold this position for 30 seconds.  Slowly return to lying flat on the floor. Repeat 2 times. Complete this exercise 3 times per week.   RANGE OF MOTION - Extension, Prone Press Ups  Lie on your stomach on the floor, a bed will be too soft. Place your palms about shoulder width apart and at the height of your head.  Keeping your back as relaxed as possible, slowly straighten your elbows while keeping your hips on the floor. You may adjust the placement of your hands to maximize your comfort. As you gain motion, your hands will come more underneath your shoulders.  Hold this position 30 seconds.  Slowly return to lying flat on the floor. Repeat 2 times. Complete this exercise 3 times per week.   RANGE OF MOTION- Quadruped, Neutral Spine   Assume a hands and knees position on a firm surface. Keep your hands under your shoulders and your knees under your hips. You may place padding under your knees for comfort.  Drop your head and point your tailbone toward the ground below you. This will round out your lower back like an angry cat. Hold this position for 30 seconds.  Slowly lift your head and release your tail bone so that your back sags into a large arch, like an old horse.  Hold this position for 30 seconds.  Repeat this until you feel limber in your low back.  Now, find your "sweet spot." This will be the most comfortable position somewhere between the two previous positions. This is your neutral spine. Once you have found this position, tense your stomach muscles to support your low back.  Hold this position for 30 seconds. Repeat 2 times. Complete this exercise 3 times per week.   STRENGTHENING EXERCISES - Low Back Sprain These exercises may help you when beginning to rehabilitate your injury. These exercises should be done near your "sweet spot." This is the neutral, low-back arch, somewhere between  fully rounded and fully arched, that is your least painful position. When performed in this safe range of motion, these exercises can be used for people who have either a flexion or extension based injury. These exercises may resolve your symptoms with or without further involvement from your physician, physical therapist or athletic trainer. While completing these exercises, remember:   Muscles can gain both the endurance and the strength needed for everyday activities through controlled exercises.  Complete these exercises as instructed by your physician, physical therapist or athletic trainer. Increase the resistance and repetitions only as guided.  You may experience muscle soreness or fatigue, but the pain or discomfort you are trying to eliminate should never worsen during these exercises. If this pain does worsen, stop and make certain you are following the directions exactly. If the pain is still present after adjustments,  discontinue the exercise until you can discuss the trouble with your caregiver.  STRENGTHENING - Deep Abdominals, Pelvic Tilt   Lie on a firm bed or floor. Keeping your legs in front of you, bend your knees so they are both pointed toward the ceiling and your feet are flat on the floor.  Tense your lower abdominal muscles to press your low back into the floor. This motion will rotate your pelvis so that your tail bone is scooping upwards rather than pointing at your feet or into the floor. With a gentle tension and even breathing, hold this position for 3 seconds. Repeat 2 times. Complete this exercise 3 times per week.   STRENGTHENING - Abdominals, Crunches   Lie on a firm bed or floor. Keeping your legs in front of you, bend your knees so they are both pointed toward the ceiling and your feet are flat on the floor. Cross your arms over your chest.  Slightly tip your chin down without bending your neck.  Tense your abdominals and slowly lift your trunk high enough to  just clear your shoulder blades. Lifting higher can put excessive stress on the lower back and does not further strengthen your abdominal muscles.  Control your return to the starting position. Repeat 2 times. Complete this exercise 3 times per week.   STRENGTHENING - Quadruped, Opposite UE/LE Lift   Assume a hands and knees position on a firm surface. Keep your hands under your shoulders and your knees under your hips. You may place padding under your knees for comfort.  Find your neutral spine and gently tense your abdominal muscles so that you can maintain this position. Your shoulders and hips should form a rectangle that is parallel with the floor and is not twisted.  Keeping your trunk steady, lift your right hand no higher than your shoulder and then your left leg no higher than your hip. Make sure you are not holding your breath. Hold this position for 30 seconds.  Continuing to keep your abdominal muscles tense and your back steady, slowly return to your starting position. Repeat with the opposite arm and leg. Repeat 2 times. Complete this exercise 3 times per week.   STRENGTHENING - Abdominals and Quadriceps, Straight Leg Raise   Lie on a firm bed or floor with both legs extended in front of you.  Keeping one leg in contact with the floor, bend the other knee so that your foot can rest flat on the floor.  Find your neutral spine, and tense your abdominal muscles to maintain your spinal position throughout the exercise.  Slowly lift your straight leg off the floor about 6 inches for a count of 3, making sure to not hold your breath.  Still keeping your neutral spine, slowly lower your leg all the way to the floor. Repeat this exercise with each leg 2 times. Complete this exercise 3 times per week.  POSTURE AND BODY MECHANICS CONSIDERATIONS - Low Back Sprain Keeping correct posture when sitting, standing or completing your activities will reduce the stress put on different body  tissues, allowing injured tissues a chance to heal and limiting painful experiences. The following are general guidelines for improved posture.  While reading these guidelines, remember:  The exercises prescribed by your provider will help you have the flexibility and strength to maintain correct postures.  The correct posture provides the best environment for your joints to work. All of your joints have less wear and tear when properly supported by a  spine with good posture. This means you will experience a healthier, less painful body.  Correct posture must be practiced with all of your activities, especially prolonged sitting and standing. Correct posture is as important when doing repetitive low-stress activities (typing) as it is when doing a single heavy-load activity (lifting).  RESTING POSITIONS Consider which positions are most painful for you when choosing a resting position. If you have pain with flexion-based activities (sitting, bending, stooping, squatting), choose a position that allows you to rest in a less flexed posture. You would want to avoid curling into a fetal position on your side. If your pain worsens with extension-based activities (prolonged standing, working overhead), avoid resting in an extended position such as sleeping on your stomach. Most people will find more comfort when they rest with their spine in a more neutral position, neither too rounded nor too arched. Lying on a non-sagging bed on your side with a pillow between your knees, or on your back with a pillow under your knees will often provide some relief. Keep in mind, being in any one position for a prolonged period of time, no matter how correct your posture, can still lead to stiffness.  PROPER SITTING POSTURE In order to minimize stress and discomfort on your spine, you must sit with correct posture. Sitting with good posture should be effortless for a healthy body. Returning to good posture is a gradual  process. Many people can work toward this most comfortably by using various supports until they have the flexibility and strength to maintain this posture on their own. When sitting with proper posture, your ears will fall over your shoulders and your shoulders will fall over your hips. You should use the back of the chair to support your upper back. Your lower back will be in a neutral position, just slightly arched. You may place a small pillow or folded towel at the base of your lower back for  support.  When working at a desk, create an environment that supports good, upright posture. Without extra support, muscles tire, which leads to excessive strain on joints and other tissues. Keep these recommendations in mind:  CHAIR:  A chair should be able to slide under your desk when your back makes contact with the back of the chair. This allows you to work closely.  The chair's height should allow your eyes to be level with the upper part of your monitor and your hands to be slightly lower than your elbows.  BODY POSITION  Your feet should make contact with the floor. If this is not possible, use a foot rest.  Keep your ears over your shoulders. This will reduce stress on your neck and low back.  INCORRECT SITTING POSTURES  If you are feeling tired and unable to assume a healthy sitting posture, do not slouch or slump. This puts excessive strain on your back tissues, causing more damage and pain. Healthier options include:  Using more support, like a lumbar pillow.  Switching tasks to something that requires you to be upright or walking.  Talking a brief walk.  Lying down to rest in a neutral-spine position.  PROLONGED STANDING WHILE SLIGHTLY LEANING FORWARD  When completing a task that requires you to lean forward while standing in one place for a long time, place either foot up on a stationary 2-4 inch high object to help maintain the best posture. When both feet are on the ground, the  lower back tends to lose its slight inward curve.  If this curve flattens (or becomes too large), then the back and your other joints will experience too much stress, tire more quickly, and can cause pain.  CORRECT STANDING POSTURES Proper standing posture should be assumed with all daily activities, even if they only take a few moments, like when brushing your teeth. As in sitting, your ears should fall over your shoulders and your shoulders should fall over your hips. You should keep a slight tension in your abdominal muscles to brace your spine. Your tailbone should point down to the ground, not behind your body, resulting in an over-extended swayback posture.   INCORRECT STANDING POSTURES  Common incorrect standing postures include a forward head, locked knees and/or an excessive swayback. WALKING Walk with an upright posture. Your ears, shoulders and hips should all line-up.  PROLONGED ACTIVITY IN A FLEXED POSITION When completing a task that requires you to bend forward at your waist or lean over a low surface, try to find a way to stabilize 3 out of 4 of your limbs. You can place a hand or elbow on your thigh or rest a knee on the surface you are reaching across. This will provide you more stability, so that your muscles do not tire as quickly. By keeping your knees relaxed, or slightly bent, you will also reduce stress across your lower back. CORRECT LIFTING TECHNIQUES  DO :  Assume a wide stance. This will provide you more stability and the opportunity to get as close as possible to the object which you are lifting.  Tense your abdominals to brace your spine. Bend at the knees and hips. Keeping your back locked in a neutral-spine position, lift using your leg muscles. Lift with your legs, keeping your back straight.  Test the weight of unknown objects before attempting to lift them.  Try to keep your elbows locked down at your sides in order get the best strength from your shoulders  when carrying an object.     Always ask for help when lifting heavy or awkward objects. INCORRECT LIFTING TECHNIQUES DO NOT:   Lock your knees when lifting, even if it is a small object.  Bend and twist. Pivot at your feet or move your feet when needing to change directions.  Assume that you can safely pick up even a paperclip without proper posture.

## 2018-02-06 NOTE — Progress Notes (Signed)
Pre visit review using our clinic review tool, if applicable. No additional management support is needed unless otherwise documented below in the visit note. 

## 2018-02-07 ENCOUNTER — Other Ambulatory Visit: Payer: Self-pay | Admitting: Family Medicine

## 2018-02-07 DIAGNOSIS — E7800 Pure hypercholesterolemia, unspecified: Secondary | ICD-10-CM

## 2018-02-07 LAB — HIV ANTIBODY (ROUTINE TESTING W REFLEX): HIV 1&2 Ab, 4th Generation: NONREACTIVE

## 2018-02-07 NOTE — Progress Notes (Signed)
Lipid panel ordered for 3 mo (when [facility] has appt). [facility]

## 2018-03-20 ENCOUNTER — Encounter: Payer: Self-pay | Admitting: Family Medicine

## 2018-03-25 ENCOUNTER — Encounter: Payer: Self-pay | Admitting: Family Medicine

## 2018-03-27 MED ORDER — FLUOXETINE HCL 40 MG PO CAPS: 40.0000 mg | ORAL_CAPSULE | Freq: Every evening | ORAL | 1 refills | Status: DC

## 2018-03-27 MED ORDER — ALPRAZOLAM 0.25 MG PO TABS: 0.2500 mg | ORAL_TABLET | Freq: Two times a day (BID) | ORAL | 5 refills | Status: DC | PRN

## 2018-03-28 ENCOUNTER — Telehealth: Payer: Self-pay | Admitting: Family Medicine

## 2018-03-28 NOTE — Telephone Encounter (Signed)
Copied from CRM [id number]. Topic: Quick Communication - See Telephone Encounter >> Mar 28, 2018  9:44 AM Valentina Lucks wrote: CRM for notification. See Telephone encounter for: 03/28/18.   Pt's family member (brother) dropped off document for provider to fill out for pt ( FMLA from [employer]- 7 pages including a letter written by pt) Pt would like document to be faxed to [phone] when ready . Document put at front office tray under provider in a yellow big envelope with Husband document also.

## 2018-03-29 ENCOUNTER — Other Ambulatory Visit: Payer: Self-pay | Admitting: Family Medicine

## 2018-03-29 MED ORDER — ALPRAZOLAM 0.25 MG PO TABS: 0.2500 mg | ORAL_TABLET | Freq: Three times a day (TID) | ORAL | 1 refills | Status: DC | PRN

## 2018-04-10 NOTE — Telephone Encounter (Signed)
Called HR Contact, Grace Scriven this morning and LMOM with contact name and number [for return call, if needed] RE: error on myself for not getting paperwork in by due date of 04/07/18 and that I would have completed paperwork over to her office today/SLS 05/13   Completed FMLA paperwork, had provider review and sign; faxed to [employer] with confirmation/SLS 05/13 

## 2018-04-17 ENCOUNTER — Other Ambulatory Visit: Payer: Self-pay | Admitting: Family Medicine

## 2018-04-17 ENCOUNTER — Encounter: Payer: Self-pay | Admitting: Family Medicine

## 2018-04-17 MED ORDER — RIVAROXABAN 20 MG PO TABS: 20.0000 mg | ORAL_TABLET | Freq: Every day | ORAL | 0 refills | Status: DC

## 2018-05-15 ENCOUNTER — Other Ambulatory Visit: Payer: Self-pay | Admitting: Family Medicine

## 2018-05-15 ENCOUNTER — Ambulatory Visit: Payer: BLUE CROSS/BLUE SHIELD | Admitting: Family Medicine

## 2018-05-15 ENCOUNTER — Encounter: Payer: Self-pay | Admitting: Family Medicine

## 2018-05-15 VITALS — BP 110/68 | HR 77 | Temp 97.8°F | Ht 62.0 in | Wt 144.1 lb

## 2018-05-15 DIAGNOSIS — R600 Localized edema: Secondary | ICD-10-CM | POA: Diagnosis not present

## 2018-05-15 DIAGNOSIS — F419 Anxiety disorder, unspecified: Secondary | ICD-10-CM | POA: Diagnosis not present

## 2018-05-15 DIAGNOSIS — E875 Hyperkalemia: Secondary | ICD-10-CM

## 2018-05-15 DIAGNOSIS — M26629 Arthralgia of temporomandibular joint, unspecified side: Secondary | ICD-10-CM | POA: Insufficient documentation

## 2018-05-15 DIAGNOSIS — S0300XA Dislocation of jaw, unspecified side, initial encounter: Secondary | ICD-10-CM | POA: Insufficient documentation

## 2018-05-15 LAB — T4, FREE: Free T4: 0.61 ng/dL (ref 0.60–1.60)

## 2018-05-15 LAB — COMPREHENSIVE METABOLIC PANEL
ALBUMIN: 4.1 g/dL (ref 3.5–5.2)
ALK PHOS: 102 U/L (ref 39–117)
ALT: 20 U/L (ref 0–35)
AST: 22 U/L (ref 0–37)
BILIRUBIN TOTAL: 0.3 mg/dL (ref 0.2–1.2)
BUN: 15 mg/dL (ref 6–23)
CALCIUM: 10.2 mg/dL (ref 8.4–10.5)
CO2: 29 mEq/L (ref 19–32)
CREATININE: 0.71 mg/dL (ref 0.40–1.20)
Chloride: 103 mEq/L (ref 96–112)
GFR: 91.48 mL/min (ref >60.00)
Glucose, Bld: 92 mg/dL (ref 70–99)
Potassium: 5.2 mEq/L — ABNORMAL HIGH (ref 3.5–5.1)
Sodium: 141 mEq/L (ref 135–145)
Total Protein: 7.4 g/dL (ref 6.0–8.3)

## 2018-05-15 LAB — TSH: TSH: 4.11 u[IU]/mL (ref 0.35–4.50)

## 2018-05-15 MED ORDER — LIOTHYRONINE SODIUM 25 MCG PO TABS: 25.0000 ug | ORAL_TABLET | Freq: Every day | ORAL | 3 refills | Status: DC

## 2018-05-15 MED ORDER — RIVAROXABAN 20 MG PO TABS: 20.0000 mg | ORAL_TABLET | Freq: Every day | ORAL | 11 refills | Status: DC

## 2018-05-15 MED ORDER — CLONAZEPAM 0.5 MG PO TABS: 0.5000 mg | ORAL_TABLET | Freq: Two times a day (BID) | ORAL | 1 refills | Status: DC | PRN

## 2018-05-15 NOTE — Patient Instructions (Addendum)
Please consider counseling. Contact [phone] to schedule an appointment or inquire about cost/insurance coverage.  Coping skills Choose 5 that work for you:  Take a deep breath  Count to 20  Read a book  Do a puzzle  Meditate  Bake  Sing  Knit  Garden  Pray  Go outside  Call a friend  Listen to music  Take a walk  Color  Send a note  Take a bath  Watch a movie  Be alone in a quiet place  Pet an animal  Visit a friend  Journal  Exercise  Stretch   Try to keep the salt low and legs elevated. Consider compression stockings.  If you do not hear anything about your echo in the next 1-2 weeks, call our office and ask for an update.  Let us know if you need anything.

## 2018-05-15 NOTE — Progress Notes (Signed)
Chief Complaint  Patient presents with  . Follow-up    Subjective: Patient is a 52 y.o. female here for medication follow-up.  Patient has a history of generalized anxiety.  She is on Prozac and Xanax.  Due to social stressors including an alcoholic son and mentally ill daughter, she has been taking Xanax more frequently.  She is not currently following with a psychologist.  She has no thoughts of harming herself or others.  Over the past 2 months, the patient has been expensing intermittent swelling in both of her ankles.  We will go up to her distal third of tibia bilaterally.  No change in diet.  She does not have any shortness of breath or productive cough.  Weight has largely been unchanged.  Lastly, she has been mentioning a headache.  She does have a history of TMJ.  She follows with a dentist, however has never discussed TMJ with her provider.  ROS: Psych: As noted in HPI Heart: No CP  Family History  Problem Relation Age of Onset  . Stroke Mother   . Cancer Mother        breast cancer  . Diabetes Father   . Parkinson's disease Father    Past Medical History:  Diagnosis Date  . Anxiety   . Asplenia   . Depression   . Family history of anesthesia complication    mother difficult to wake  . GERD (gastroesophageal reflux disease)   . History of blood clots   . Hyperlipidemia    Allergies  Allergen Reactions  . Bactrim [Sulfamethoxazole-Trimethoprim] Other (See Comments)    Causes a yeast infection  . Cymbalta [Duloxetine Hcl] Anxiety    Current Outpatient Medications:  .  Cholecalciferol (VITAMIN D-3) 1000 units CAPS, Take 1,000 Units by mouth., Disp: , Rfl:  .  esomeprazole (NEXIUM) 20 MG capsule, Take 20 mg by mouth daily at 12 noon., Disp: , Rfl:  .  FLUoxetine (PROZAC) 40 MG capsule, Take 1 capsule (40 mg total) by mouth every evening., Disp: 90 capsule, Rfl: 1 .  liothyronine (CYTOMEL) 25 MCG tablet, Take 1 tablet (25 mcg total) by mouth daily., Disp: 90  tablet, Rfl: 3 .  rivaroxaban (XARELTO) 20 MG TABS tablet, Take 1 tablet (20 mg total) by mouth daily with supper., Disp: 30 tablet, Rfl: 11 .  vitamin B-12 (CYANOCOBALAMIN) 1000 MCG tablet, Take 1,000 mcg by mouth daily., Disp: , Rfl:  .  clonazePAM (KLONOPIN) 0.5 MG tablet, Take 1 tablet (0.5 mg total) by mouth 2 (two) times daily as needed for anxiety., Disp: 30 tablet, Rfl: 1  Objective: BP 110/68 (BP Location: Left Arm, Patient Position: Sitting, Cuff Size: Normal)   Pulse 77   Temp 97.8 F (36.6 C) (Oral)   Ht 5\' 2"  (1.575 m)   Wt 144 lb 2 oz (65.4 kg)   SpO2 96%   BMI 26.36 kg/m  General: Awake, appears stated age HEENT: MMM, EOMi Heart: RRR, no swelling Lungs: CTAB, no rales, wheezes or rhonchi. No accessory muscle use Abd: BS+, soft, NT, ND, no masses or organomegaly MSK: +TTP over L TMJ, +click with motion through b/l TMJ and jaw deviation to L Neuro: DTRs equal and symmetric throughout, no clonus, no cerebellar signs Psych: Age appropriate judgment and insight, normal affect and mood  Assessment and Plan: Anxiety - Plan: clonazePAM (KLONOPIN) 0.5 MG tablet  Lower extremity edema - Plan: TSH, Comprehensive metabolic panel, T4, free, ECHOCARDIOGRAM COMPLETE  TMJ syndrome  Orders as above. Number for  counseling provided.  Change Xanax to Klonopin.  Continue Prozac. Check labs, echo, elevate legs, mind salt intake, stay active, consider compression stockings. Anti-inflammatories, avoid excess chewing, follow-up with dentist. I will see her in 6 weeks for a med check. The patient voiced understanding and agreement to the plan.  Jilda Roche [place], DO 05/15/18  12:09 PM

## 2018-05-15 NOTE — Progress Notes (Signed)
Pre visit review using our clinic review tool, if applicable. No additional management support is needed unless otherwise documented below in the visit note. 

## 2018-05-22 ENCOUNTER — Encounter: Payer: Self-pay | Admitting: Family Medicine

## 2018-05-22 ENCOUNTER — Ambulatory Visit: Payer: BLUE CROSS/BLUE SHIELD | Admitting: Family Medicine

## 2018-05-22 ENCOUNTER — Other Ambulatory Visit: Payer: Self-pay | Admitting: Family Medicine

## 2018-05-22 ENCOUNTER — Other Ambulatory Visit (INDEPENDENT_AMBULATORY_CARE_PROVIDER_SITE_OTHER): Payer: BLUE CROSS/BLUE SHIELD

## 2018-05-22 VITALS — BP 120/80 | HR 83 | Temp 98.0°F | Ht 62.0 in | Wt 143.1 lb

## 2018-05-22 DIAGNOSIS — K1120 Sialoadenitis, unspecified: Secondary | ICD-10-CM

## 2018-05-22 DIAGNOSIS — E875 Hyperkalemia: Secondary | ICD-10-CM

## 2018-05-22 LAB — BASIC METABOLIC PANEL
BUN: 15 mg/dL (ref 6–23)
CALCIUM: 9.9 mg/dL (ref 8.4–10.5)
CO2: 28 meq/L (ref 19–32)
Chloride: 103 mEq/L (ref 96–112)
Creatinine, Ser: 0.69 mg/dL (ref 0.40–1.20)
GFR: 94.54 mL/min (ref >60.00)
GLUCOSE: 98 mg/dL (ref 70–99)
POTASSIUM: 4.3 meq/L (ref 3.5–5.1)
SODIUM: 140 meq/L (ref 135–145)

## 2018-05-22 MED ORDER — HYDROCODONE-ACETAMINOPHEN 5-325 MG PO TABS: 1.0000 | ORAL_TABLET | Freq: Four times a day (QID) | ORAL | 0 refills | Status: DC | PRN

## 2018-05-22 MED ORDER — PREDNISONE 20 MG PO TABS: 40.0000 mg | ORAL_TABLET | Freq: Every day | ORAL | 0 refills | Status: AC

## 2018-05-22 NOTE — Progress Notes (Signed)
Chief Complaint  Patient presents with  . Facial Pain    left    Subjective: Patient is a 52 y.o. female here for L sided facial pain.  Started yesterday. No inj or change in activity. She does have a hx of TMJ, but does not feel it is similar. +swelling.  Denies fevers, drainage, dental issues, bruising, redness.  ROS: Const: Denies fevers  Past Medical History:  Diagnosis Date  . Anxiety   . Asplenia   . Depression   . Family history of anesthesia complication    mother difficult to wake  . GERD (gastroesophageal reflux disease)   . History of blood clots   . Hyperlipidemia     Objective: BP 120/80 (BP Location: Left Arm, Patient Position: Sitting, Cuff Size: Normal)   Pulse 83   Temp 98 F (36.7 C) (Oral)   Ht 5\' 2"  (1.575 m)   Wt 143 lb 2 oz (64.9 kg)   SpO2 96%   BMI 26.18 kg/m  General: Awake, appears stated age HEENT: MMM, EOMi, no evidence of dental decay MSK: +TTP over TMJ and lower mand on L Heart: RRR, no murmurs Lungs: CTAB, no rales, wheezes or rhonchi. No accessory muscle use Psych: Age appropriate judgment and insight, normal affect and mood  Assessment and Plan: Parotitis - Plan: predniSONE (DELTASONE) 20 MG tablet, HYDROcodone-acetaminophen (NORCO/VICODIN) 5-325 MG tablet  Orders as above. Steroid for inflammation as she is on Xarelto. Norco for breakthrough pain. Cautioned on use of this medication. Does not seem to be suppurative.  F/u prn. The patient voiced understanding and agreement to the plan.  Jilda Rocheicholas Paul [place], DO 05/22/18  10:16 AM

## 2018-05-22 NOTE — Patient Instructions (Addendum)
Ice/cold pack over area for 10-15 min twice daily.  OK to take Tylenol 1000 mg (2 extra strength tabs) or 975 mg (3 regular strength tabs) every 8 hours as needed.  Do not drink alcohol, do any illicit/street drugs, drive or do anything that requires alertness while on this medicine.   This is thought to be caused by a virus, time is the best thing for it.   Consider protein shakes/meal replacement shakes if eating is too difficult.  Let us know if you need anything.

## 2018-05-22 NOTE — Progress Notes (Signed)
Pre visit review using our clinic review tool, if applicable. No additional management support is needed unless otherwise documented below in the visit note. 

## 2018-05-24 ENCOUNTER — Encounter (HOSPITAL_COMMUNITY): Payer: Self-pay | Admitting: Emergency Medicine

## 2018-05-24 ENCOUNTER — Emergency Department (HOSPITAL_COMMUNITY): Payer: BLUE CROSS/BLUE SHIELD

## 2018-05-24 ENCOUNTER — Emergency Department (HOSPITAL_COMMUNITY)
Admission: EM | Admit: 2018-05-24 | Discharge: 2018-05-24 | Disposition: A | Discharge status: Home / Self Care | Payer: BLUE CROSS/BLUE SHIELD | Attending: Emergency Medicine | Admitting: Emergency Medicine

## 2018-05-24 ENCOUNTER — Other Ambulatory Visit: Payer: Self-pay

## 2018-05-24 DIAGNOSIS — R51 Headache: Secondary | ICD-10-CM | POA: Insufficient documentation

## 2018-05-24 DIAGNOSIS — Z86718 Personal history of other venous thrombosis and embolism: Secondary | ICD-10-CM | POA: Insufficient documentation

## 2018-05-24 DIAGNOSIS — I676 Nonpyogenic thrombosis of intracranial venous system: Secondary | ICD-10-CM | POA: Diagnosis not present

## 2018-05-24 DIAGNOSIS — G08 Intracranial and intraspinal phlebitis and thrombophlebitis: Secondary | ICD-10-CM

## 2018-05-24 DIAGNOSIS — Z87891 Personal history of nicotine dependence: Secondary | ICD-10-CM | POA: Insufficient documentation

## 2018-05-24 DIAGNOSIS — R519 Headache, unspecified: Secondary | ICD-10-CM

## 2018-05-24 DIAGNOSIS — Z79899 Other long term (current) drug therapy: Secondary | ICD-10-CM | POA: Diagnosis not present

## 2018-05-24 DIAGNOSIS — R42 Dizziness and giddiness: Secondary | ICD-10-CM | POA: Diagnosis not present

## 2018-05-24 DIAGNOSIS — G43909 Migraine, unspecified, not intractable, without status migrainosus: Secondary | ICD-10-CM | POA: Diagnosis not present

## 2018-05-24 LAB — I-STAT CHEM 8, ED
BUN: 8 mg/dL (ref 6–20)
CHLORIDE: 103 mmol/L (ref 98–111)
CREATININE: 0.6 mg/dL (ref 0.44–1.00)
Calcium, Ion: 1.25 mmol/L (ref 1.15–1.40)
GLUCOSE: 104 mg/dL — AB (ref 70–99)
HCT: 39 % (ref 36.0–46.0)
Hemoglobin: 13.3 g/dL (ref 12.0–15.0)
POTASSIUM: 3.6 mmol/L (ref 3.5–5.1)
Sodium: 142 mmol/L (ref 135–145)
TCO2: 26 mmol/L (ref 22–32)

## 2018-05-24 MED ORDER — DEXTROSE 5 % IV SOLN
500.0000 mg | Freq: Once | INTRAVENOUS | Status: AC
  Administered 2018-05-24: 500 mg via INTRAVENOUS
  Filled 2018-05-24: qty 5

## 2018-05-24 MED ORDER — ACETAMINOPHEN 325 MG PO TABS
650.0000 mg | ORAL_TABLET | Freq: Once | ORAL | Status: AC
  Administered 2018-05-24: 650 mg via ORAL
  Filled 2018-05-24: qty 2

## 2018-05-24 MED ORDER — PROCHLORPERAZINE EDISYLATE 10 MG/2ML IJ SOLN
10.0000 mg | Freq: Once | INTRAMUSCULAR | Status: AC
  Administered 2018-05-24: 10 mg via INTRAVENOUS
  Filled 2018-05-24: qty 2

## 2018-05-24 MED ORDER — DIPHENHYDRAMINE HCL 50 MG/ML IJ SOLN
25.0000 mg | Freq: Once | INTRAMUSCULAR | Status: AC
Start: 2018-05-24 — End: 2018-05-24
  Administered 2018-05-24: 25 mg via INTRAVENOUS
  Filled 2018-05-24: qty 1

## 2018-05-24 MED ORDER — MORPHINE SULFATE (PF) 4 MG/ML IV SOLN
4.0000 mg | Freq: Once | INTRAVENOUS | Status: AC
  Administered 2018-05-24: 4 mg via INTRAVENOUS
  Filled 2018-05-24: qty 1

## 2018-05-24 MED ORDER — METOCLOPRAMIDE HCL 5 MG/ML IJ SOLN
10.0000 mg | Freq: Once | INTRAMUSCULAR | Status: AC
  Administered 2018-05-24: 10 mg via INTRAVENOUS
  Filled 2018-05-24: qty 2

## 2018-05-24 MED ORDER — IOHEXOL 300 MG/ML  SOLN
75.0000 mL | Freq: Once | INTRAMUSCULAR | Status: AC | PRN
  Administered 2018-05-24: 75 mL via INTRAVENOUS

## 2018-05-24 MED ORDER — KETOROLAC TROMETHAMINE 15 MG/ML IJ SOLN
15.0000 mg | Freq: Once | INTRAMUSCULAR | Status: AC
  Administered 2018-05-24: 15 mg via INTRAVENOUS
  Filled 2018-05-24: qty 1

## 2018-05-24 NOTE — ED Provider Notes (Addendum)
[facility] EMERGENCY DEPARTMENT Provider Note   CSN: [id number] Arrival date & time: 05/24/18  0247     History   Chief Complaint Chief Complaint  Patient presents with  . Migraine    HPI Cathy Simmons is a 52 y.o. female.  HPI 52 year old female with history of cerebral venous thrombosis comes in with chief complaint of headache. Patient states that over the past 2 to 3 days she has started having headaches that are constant and similar to her CVT pain.  Pain is located on the left side of her face and head.  Patient denies any associated numbness, tingling, vision change, dizziness, focal weakness.  Patient was started on oral pain medication and prednisone by her PCP, however that has not resulted in any improvement in her pain.  Patient is taking Eliquis and has been compliant with her medications.   Past Medical History:  Diagnosis Date  . Anxiety   . Asplenia   . Depression   . Family history of anesthesia complication    mother difficult to wake  . GERD (gastroesophageal reflux disease)   . History of blood clots   . Hyperlipidemia     Patient Active Problem List   Diagnosis Date Noted  . Dislocation of jaw 05/15/2018  . TMJ syndrome 05/15/2018  . Asplenia   . Anxiety     Past Surgical History:  Procedure Laterality Date  . BREAST SURGERY  breat biopsy x2 last 1995  . DILATION AND CURETTAGE OF UTERUS  x2 1984 and 1991  . excision left ganglion cyst Left   . FOREIGN BODY REMOVAL Right 01/29/2013   Procedure: EXCISION FOREIGN BODY RIGHT SMALL FINGER;  Surgeon: Nicki Reaper, MD;  Location: [facility];  Service: Orthopedics;  Laterality: Right;  . removal spleen  2000     OB History   None      Home Medications    Prior to Admission medications   Medication Sig Start Date End Date Taking? Authorizing Provider  Cholecalciferol (VITAMIN D-3) 1000 units CAPS Take 1,000 Units by mouth.    [provider]    clonazePAM (KLONOPIN) 0.5 MG tablet Take 1 tablet (0.5 mg total) by mouth 2 (two) times daily as needed for anxiety. 05/15/18   Sharlene Dory, DO  esomeprazole (NEXIUM) 20 MG capsule Take 20 mg by mouth daily at 12 noon.    [provider]  FLUoxetine (PROZAC) 40 MG capsule Take 1 capsule (40 mg total) by mouth every evening. 03/27/18   Sharlene Dory, DO  HYDROcodone-acetaminophen (NORCO/VICODIN) 5-325 MG tablet Take 1 tablet by mouth every 6 (six) hours as needed for moderate pain. 05/22/18   Sharlene Dory, DO  liothyronine (CYTOMEL) 25 MCG tablet Take 1 tablet (25 mcg total) by mouth daily. 05/15/18   Sharlene Dory, DO  predniSONE (DELTASONE) 20 MG tablet Take 2 tablets (40 mg total) by mouth daily with breakfast for 5 days. 05/22/18 05/27/18  Sharlene Dory, DO  rivaroxaban (XARELTO) 20 MG TABS tablet Take 1 tablet (20 mg total) by mouth daily with supper. 05/15/18   Sharlene Dory, DO  vitamin B-12 (CYANOCOBALAMIN) 1000 MCG tablet Take 1,000 mcg by mouth daily.    [provider]    Family History Family History  Problem Relation Age of Onset  . Stroke Mother   . Cancer Mother        breast cancer  . Diabetes Father   . Parkinson's  disease Father     Social History Social History   Tobacco Use  . Smoking status: Former Smoker    Packs/day: 0.50    Last attempt to quit: 01/30/1999    Years since quitting: 19.3  . Smokeless tobacco: Never Used  Substance Use Topics  . Alcohol use: No  . Drug use: No     Allergies   Bactrim [sulfamethoxazole-trimethoprim] and Cymbalta [duloxetine hcl]   Review of Systems Review of Systems  Constitutional: Positive for activity change.  Gastrointestinal: Negative for nausea and vomiting.  Musculoskeletal: Negative for neck pain and neck stiffness.  Skin: Negative for rash.  Allergic/Immunologic: Negative for immunocompromised state.  Neurological: Positive for  dizziness, facial asymmetry and headaches. Negative for seizures, speech difficulty, weakness and numbness.  Hematological: Bruises/bleeds easily.  All other systems reviewed and are negative.    Physical Exam Updated Vital Signs BP 122/68   Pulse 78   Temp 98.4 F (36.9 C) (Oral)   Resp 17   SpO2 96%   Physical Exam  Constitutional: She is oriented to person, place, and time. She appears well-developed.  HENT:  Head: Normocephalic and atraumatic.  Eyes: Pupils are equal, round, and reactive to light. EOM are normal.  Neck: Normal range of motion. Neck supple.  Cardiovascular: Normal rate.  Pulmonary/Chest: Effort normal.  Abdominal: Bowel sounds are normal.  Neurological: She is alert and oriented to person, place, and time. No cranial nerve deficit or sensory deficit. Coordination normal.  Cerebellar exam is normal (finger to nose) Sensory exam normal for bilateral upper and lower extremities - and patient is able to discriminate between sharp and dull. Motor exam is 4+/5   Skin: Skin is warm and dry.  Nursing note and vitals reviewed.    ED Treatments / Results  Labs (all labs ordered are listed, but only abnormal results are displayed) Labs Reviewed  I-STAT CHEM 8, ED - Abnormal; Notable for the following components:      Result Value   Glucose, Bld 104 (*)    All other components within normal limits    EKG None  Radiology Ct Venogram Head  Result Date: 05/24/2018 CLINICAL DATA:  52 y/o F; 3 days of left-sided headache and dizziness with blurred vision of the left eye. History of dural venous sinus thrombosis. EXAM: CT VENOGRAM HEAD TECHNIQUE: Multidetector CT imaging of the head was performed using the standard protocol during bolus administration of intravenous contrast. Multiplanar CT image reconstructions and MIPs were obtained to evaluate the venous anatomy. CONTRAST:  75mL OMNIPAQUE IOHEXOL 300 MG/ML  SOLN COMPARISON:  09/01/2015 MRI and MRV of the head.  FINDINGS: CT HEAD Brain: No evidence of acute infarction, hemorrhage, hydrocephalus, extra-axial collection or mass lesion/mass effect. Vascular: As below. Skull: Normal. Negative for fracture or focal lesion. Sinuses: Mild left maxillary sinus mucosal thickening with mucous retention cyst. Normal aeration of mastoid air cells. Orbits are unremarkable. Orbits: Orbits are unremarkable. CTV HEAD Small nonocclusive filling defects are present within the periphery of the left transverse sinus system at the transverse sigmoid junction (series 7, image 19 and series 9, image 69) and adjacent to the torcula (series 10, image 16 and series 9, image 84). Right transverse sinus system, superior sagittal sinus, straight sinus, large cortical veins, internal cerebral veins, cavernous sinuses, and basal veins of Rosenthal enhance normally. IMPRESSION: 1. Small nonocclusive filling defects within the periphery of the left transverse sinus at the transverse sigmoid junction and adjacent to the torcula. Findings probably residual represent  chronic dural venous sinus thrombosis, less likely acute thrombus. 2. Stable negative noncontrast CT of the head. Electronically Signed   By: Mitzi HansenLance  Furusawa-Stratton M.D.   On: 05/24/2018 06:17    Procedures Procedures (including critical care time)  Medications Ordered in ED Medications  acetaminophen (TYLENOL) tablet 650 mg (has no administration in time range)  valproate (DEPACON) 500 mg in dextrose 5 % 50 mL IVPB (has no administration in time range)  prochlorperazine (COMPAZINE) injection 10 mg (has no administration in time range)  ketorolac (TORADOL) 15 MG/ML injection 15 mg (has no administration in time range)  diphenhydrAMINE (BENADRYL) injection 25 mg (has no administration in time range)  metoCLOPramide (REGLAN) injection 10 mg (10 mg Intravenous Given 05/24/18 0431)  morphine 4 MG/ML injection 4 mg (4 mg Intravenous Given 05/24/18 0432)  iohexol (OMNIPAQUE) 300 MG/ML  solution 75 mL (75 mLs Intravenous Contrast Given 05/24/18 0526)     Initial Impression / Assessment and Plan / ED Course  I have reviewed the triage vital signs and the nursing notes.  Pertinent labs & imaging results that were available during my care of the patient were reviewed by me and considered in my medical decision making (see chart for details).  Clinical Course as of May 24 722  Wed May 24, 2018  0722 I spoke with Dr. Onalee HuaMcNeil Kirkpatrick.  We discussed the patient's presenting symptoms along with her history and the results of CT venogram.  Dr. Amada JupiterKirkpatrick has reviewed the CT venogram, and it appears that the CT looks better now than he did in the past.  He recommends that we give patient cocktail for the headache and reassess her.  He does not think patient needs an MRI.  Patient's care will be transferred to the incoming team. Patient's current headache level is 6 out of 10.   [AN]    Clinical Course User Index [AN] Derwood KaplanNanavati, Madai Nuccio, MD    52 year old comes in with chief complaint of headache. Patient has history of cerebral venous thrombosis and is on Xarelto.  She has been compliant with her medications and states that she has been having 2 to 3 days of constant severe abdominal pain that is similar to her CVT pain.  Patient has seen her PCP and take an over-the-counter pain medicine without any relief.  She has no associated neurologic symptoms and on exam there is no focal neurologic deficits.  CT venogram ordered in the ED and does not reveal any acute findings. Patient's headache has improved with IV opioids.  We will give her oral Tylenol.  I have consulted neurology to see MRI is indicated.  Final Clinical Impressions(s) / ED Diagnoses   Final diagnoses:  None    ED Discharge Orders    None       Derwood KaplanNanavati, Alga Southall, MD 05/24/18 [id number]    Derwood KaplanNanavati, Nesreen Albano, MD 05/24/18 [phone]

## 2018-05-24 NOTE — ED Notes (Signed)
Patient to CT.

## 2018-05-24 NOTE — ED Provider Notes (Signed)
Received signout at the beginning of shift from Dr. Rhunette CroftNanavati.  Patient with history of cerebral venous thrombosis presenting with headache for the past several days.  A CT venogram of the head demonstrate improvement compared to prior CT.  Patient received IV Depakote.  Her labs are reassuring.  Patient was monitored for several hours and his symptoms did improve.  At this time, patient is stable for discharge.  Will give referral to outpatient neurology follow-up as needed.  Return precautions discussed.  BP 91/62 (BP Location: Left Arm)   Pulse 72   Temp 98.4 F (36.9 C) (Oral)   Resp 14   SpO2 92%   Results for orders placed or performed during the hospital encounter of 05/24/18  I-stat chem 8, ed  Result Value Ref Range   Sodium 142 135 - 145 mmol/L   Potassium 3.6 3.5 - 5.1 mmol/L   Chloride 103 98 - 111 mmol/L   BUN 8 6 - 20 mg/dL   Creatinine, Ser 9.560.60 0.44 - 1.00 mg/dL   Glucose, Bld 213104 (H) 70 - 99 mg/dL   Calcium, Ion 0.861.25 5.781.15 - 1.40 mmol/L   TCO2 26 22 - 32 mmol/L   Hemoglobin 13.3 12.0 - 15.0 g/dL   HCT 46.939.0 62.936.0 - 52.846.0 %   Ct Venogram Head  Result Date: 05/24/2018 CLINICAL DATA:  52 y/o F; 3 days of left-sided headache and dizziness with blurred vision of the left eye. History of dural venous sinus thrombosis. EXAM: CT VENOGRAM HEAD TECHNIQUE: Multidetector CT imaging of the head was performed using the standard protocol during bolus administration of intravenous contrast. Multiplanar CT image reconstructions and MIPs were obtained to evaluate the venous anatomy. CONTRAST:  75mL OMNIPAQUE IOHEXOL 300 MG/ML  SOLN COMPARISON:  09/01/2015 MRI and MRV of the head. FINDINGS: CT HEAD Brain: No evidence of acute infarction, hemorrhage, hydrocephalus, extra-axial collection or mass lesion/mass effect. Vascular: As below. Skull: Normal. Negative for fracture or focal lesion. Sinuses: Mild left maxillary sinus mucosal thickening with mucous retention cyst. Normal aeration of mastoid air  cells. Orbits are unremarkable. Orbits: Orbits are unremarkable. CTV HEAD Small nonocclusive filling defects are present within the periphery of the left transverse sinus system at the transverse sigmoid junction (series 7, image 19 and series 9, image 69) and adjacent to the torcula (series 10, image 16 and series 9, image 84). Right transverse sinus system, superior sagittal sinus, straight sinus, large cortical veins, internal cerebral veins, cavernous sinuses, and basal veins of Rosenthal enhance normally. IMPRESSION: 1. Small nonocclusive filling defects within the periphery of the left transverse sinus at the transverse sigmoid junction and adjacent to the torcula. Findings probably residual represent chronic dural venous sinus thrombosis, less likely acute thrombus. 2. Stable negative noncontrast CT of the head. Electronically Signed   By: Mitzi HansenLance  Furusawa-Stratton M.D.   On: 05/24/2018 06:17      Fayrene Helperran, Joslynn Jamroz, PA-C 05/24/18 1020    Margarita Grizzleay, Danielle, MD 05/24/18 [phone]

## 2018-05-24 NOTE — ED Triage Notes (Signed)
Pt began having left-sided headache on Sunday. Was seen by her physician on Monday and was given hydrocodone which provided no relief.  Pt has a history of cerebral venous sinus thrombosis and states this feels like when she had "clots" in the past.

## 2018-05-24 NOTE — Discharge Instructions (Signed)
We saw you in the ER for your headache.  The imaging in the ER does not show any evidence of cerebral venous thrombosis, in fact the CT scan now looks better than the CT scan you had 2 years ago.  However, it is prudent that he follow-up with neurologist for optimal care and management of your headaches.  Please contact the neurologist as requested. Please return to the ER if the headache gets severe and in not improving, you have associated new one sided numbness, tingling, weakness or confusion, seizures, poor balance or poor vision.   \

## 2018-05-30 ENCOUNTER — Encounter: Payer: Self-pay | Admitting: Family Medicine

## 2018-05-31 ENCOUNTER — Ambulatory Visit: Payer: BLUE CROSS/BLUE SHIELD | Admitting: Family Medicine

## 2018-06-19 ENCOUNTER — Other Ambulatory Visit: Payer: Self-pay | Admitting: Family Medicine

## 2018-06-19 MED ORDER — LEVOTHYROXINE SODIUM 25 MCG PO TABS: 25.0000 ug | ORAL_TABLET | Freq: Every day | ORAL | 5 refills | Status: DC

## 2018-06-20 ENCOUNTER — Encounter: Payer: Self-pay | Admitting: Family Medicine

## 2018-06-21 MED ORDER — LEVOTHYROXINE SODIUM 25 MCG PO TABS: 25.0000 ug | ORAL_TABLET | Freq: Every day | ORAL | 1 refills | Status: DC

## 2018-06-21 MED ORDER — RIVAROXABAN 20 MG PO TABS: 20.0000 mg | ORAL_TABLET | Freq: Every day | ORAL | 3 refills | Status: DC

## 2018-06-26 ENCOUNTER — Encounter: Payer: Self-pay | Admitting: Family Medicine

## 2018-06-26 ENCOUNTER — Ambulatory Visit: Payer: BLUE CROSS/BLUE SHIELD | Admitting: Family Medicine

## 2018-06-26 VITALS — BP 112/72 | HR 75 | Temp 98.0°F | Ht 62.0 in | Wt 145.5 lb

## 2018-06-26 DIAGNOSIS — M79631 Pain in right forearm: Secondary | ICD-10-CM

## 2018-06-26 DIAGNOSIS — L989 Disorder of the skin and subcutaneous tissue, unspecified: Secondary | ICD-10-CM | POA: Diagnosis not present

## 2018-06-26 DIAGNOSIS — M25473 Effusion, unspecified ankle: Secondary | ICD-10-CM | POA: Diagnosis not present

## 2018-06-26 DIAGNOSIS — F419 Anxiety disorder, unspecified: Secondary | ICD-10-CM

## 2018-06-26 NOTE — Patient Instructions (Signed)
Try to get a forearm strap.  Ice/cold pack over area for 10-15 min twice daily.  OK to take Tylenol 1000 mg (2 extra strength tabs) or 975 mg (3 regular strength tabs) every 6 hours as needed.  Keep an eye on the skin lesion. If it changes more, let us know.  Wear compression stockings.  Let us know if you need anything.

## 2018-06-26 NOTE — Progress Notes (Signed)
Chief Complaint  Patient presents with  . Leg Swelling  . Hand Pain  . Nevus    left breast    Subjective: Patient is a 52 y.o. female here for f/u anxiety.  Started on Klonopin 0.5 mg prn, did work, but cause drowsiness.  She is asking if she can take half a pill instead of a full tablet.  Many life stressors involving deaths in the family and in her close circle of friends.  Ankles are still swelling. Elevation has not been helpful. Intermittent. Does watch salt intake. Not currently swollen.  She does have compression stockings but wanted to make sure she should use them.  No shortness of breath or chest pain.  R elbow pain for 3 weeks. No inj or change in activity. 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  No swelling or bruising.  She has 2 lesions on her left breast that are growing in size.  It is in an area that could be irritated by her bra.  No changes in color.   ROS: Heart: Denies chest pain  Lungs: Denies SOB   Past Medical History:  Diagnosis Date  . Anxiety   . Asplenia   . Depression   . Family history of anesthesia complication    mother difficult to wake  . GERD (gastroesophageal reflux disease)   . History of blood clots   . Hyperlipidemia     Objective: BP 112/72 (BP Location: Left Arm, Patient Position: Sitting, Cuff Size: Normal)   Pulse 75   Temp 98 F (36.7 C) (Oral)   Ht 5\' 2"  (1.575 m)   Wt 145 lb 8 oz (66 kg)   SpO2 97%   BMI 26.61 kg/m  General: Awake, appears stated age HEENT: MMM, EOMi Heart: RRR, no murmurs Lungs: CTAB, no rales, wheezes or rhonchi. No accessory muscle use Skin: See below.  She was examined in the presence of a female chaperone.  No fluctuance, erythema, tenderness to palpation, drainage Psych: Age appropriate judgment and insight, normal affect and mood  Assessment and Plan: Anxiety - Plan: clonazePAM (KLONOPIN) 0.5 MG tablet  Pain of right forearm  Ankle swelling,  unspecified laterality  Skin lesion  Orders as above.  Take half tab of Klonopin. Forearm splint, stretches, ice, Tylenol. Start wearing compression stockings, mind salt intake, continue elevation. Offered to remove skin lesion versus keep an eye on things.  Opted for the latter at this time.  Likely seborrheic keratoses. Fu in 6 weeks for med check. The patient voiced understanding and agreement to the plan.  Jilda Rocheicholas Paul [place], DO 06/26/18  8:22 AM

## 2018-06-26 NOTE — Progress Notes (Signed)
Pre visit review using our clinic review tool, if applicable. No additional management support is needed unless otherwise documented below in the visit note. 

## 2018-08-28 ENCOUNTER — Encounter: Payer: Self-pay | Admitting: Family Medicine

## 2018-08-28 ENCOUNTER — Ambulatory Visit (HOSPITAL_BASED_OUTPATIENT_CLINIC_OR_DEPARTMENT_OTHER)
Admission: RE | Admit: 2018-08-28 | Discharge: 2018-08-28 | Disposition: A | Discharge status: Home / Self Care | Payer: BLUE CROSS/BLUE SHIELD | Source: Ambulatory Visit | Attending: Family Medicine | Admitting: Family Medicine

## 2018-08-28 ENCOUNTER — Ambulatory Visit: Payer: BLUE CROSS/BLUE SHIELD | Admitting: Family Medicine

## 2018-08-28 VITALS — BP 108/78 | HR 76 | Temp 98.0°F | Ht 62.0 in | Wt 145.0 lb

## 2018-08-28 DIAGNOSIS — R1032 Left lower quadrant pain: Secondary | ICD-10-CM | POA: Insufficient documentation

## 2018-08-28 DIAGNOSIS — Z1231 Encounter for screening mammogram for malignant neoplasm of breast: Secondary | ICD-10-CM

## 2018-08-28 DIAGNOSIS — Z23 Encounter for immunization: Secondary | ICD-10-CM | POA: Diagnosis not present

## 2018-08-28 DIAGNOSIS — M25552 Pain in left hip: Secondary | ICD-10-CM | POA: Diagnosis not present

## 2018-08-28 DIAGNOSIS — Z1239 Encounter for other screening for malignant neoplasm of breast: Secondary | ICD-10-CM

## 2018-08-28 DIAGNOSIS — F419 Anxiety disorder, unspecified: Secondary | ICD-10-CM | POA: Diagnosis not present

## 2018-08-28 DIAGNOSIS — M1990 Unspecified osteoarthritis, unspecified site: Secondary | ICD-10-CM

## 2018-08-28 MED ORDER — DICLOFENAC SODIUM 1 % TD GEL: 2.0000 g | Freq: Four times a day (QID) | TRANSDERMAL | 1 refills | Status: DC

## 2018-08-28 NOTE — Patient Instructions (Signed)
Please consider counseling. Contact [phone] to schedule an appointment or inquire about cost/insurance coverage.  Coping skills Choose 5 that work for you:  Take a deep breath  Count to 20  Read a book  Do a puzzle  Meditate  Bake  Sing  Knit  Garden  Pray  Go outside  Call a friend  Listen to music  Take a walk  Color  Send a note  Take a bath  Watch a movie  Be alone in a quiet place  Pet an animal  Visit a friend  Journal  Exercise  Stretch   Let us know if you need anything.  

## 2018-08-28 NOTE — Progress Notes (Signed)
Pre visit review using our clinic review tool, if applicable. No additional management support is needed unless otherwise documented below in the visit note. 

## 2018-08-28 NOTE — Progress Notes (Signed)
Chief Complaint  Patient presents with  . Follow-up    Subjective Cathy Simmons presents for f/u anxiety/depression.  She is currently being treated with Prozac and Klonopin.  Reports some improvement since treatment. Using the Klonopin nightly to help her sleep and "slow down my mind" She is still having lots of stress with deaths in the family.  No thoughts of harming self or others. No self-medication with alcohol, prescription drugs or illicit drugs. Pt is following with a counselor/psychologist.  Has joint pain. Assoc buckling of legs after standing for long periods of time. +Famhx of OA. Has been having some pain in L groin region. No catching or locking. Denies any inj or change in activity. She is on Xarelto for hx of DVT so taking nsaids is not safe. She will use Tylenol.   ROS Psych: No homicidal or suicidal thoughts MSK: +jt pain  Past Medical History:  Diagnosis Date  . Anxiety   . Asplenia   . Depression   . Family history of anesthesia complication    mother difficult to wake  . GERD (gastroesophageal reflux disease)   . History of blood clots   . Hyperlipidemia    Exam BP 108/78 (BP Location: Left Arm, Patient Position: Sitting, Cuff Size: Normal)   Pulse 76   Temp 98 F (36.7 C) (Oral)   Ht 5\' 2"  (1.575 m)   Wt 145 lb (65.8 kg)   SpO2 96%   BMI 26.52 kg/m  General:  well developed, well nourished, in no apparent distress Neck: neck supple without adenopathy, thyromegaly, or masses Lungs:  clear to auscultation, breath sounds equal bilaterally, no respiratory distress Cardio:  regular rate and rhythm without murmurs, heart sounds without clicks or rubs MSK: No Ttp over groin region, neg log roll, gait nml, no ttp over greater troch Neuro: No cerebellar signs Psych: well oriented with normal range of affect and age-appropriate judgement/insight, alert and oriented x4.  Assessment and Plan  Anxiety  Left inguinal pain - Plan: DG HIP UNILAT WITH  PELVIS 2-3 VIEWS LEFT, CANCELED: XR HIP UNILAT W OR W/O PELVIS 2-3 VIEWS LEFT  Need for influenza vaccination - Plan: Flu Vaccine QUAD 6+ mos PF IM (Fluarix Quad PF)  Arthritis - Plan: diclofenac sodium (VOLTAREN) 1 % GEL  Screening for malignant neoplasm of breast - Plan: MM DIGITAL SCREENING BILATERAL  Cont current medications. Pushed counseling. I do not want her using Klonopin to help sleep. Try Topical nsaid gel and cont Tylenol, ck XR of hip to r/o OA. F/u in 3 mo or prn. The patient voiced understanding and agreement to the plan.  Cathy Simmons [place], DO 08/28/18 8:41 AM

## 2018-08-30 ENCOUNTER — Encounter: Payer: Self-pay | Admitting: Family Medicine

## 2018-10-16 ENCOUNTER — Other Ambulatory Visit: Payer: Self-pay | Admitting: Family Medicine

## 2018-10-16 DIAGNOSIS — F419 Anxiety disorder, unspecified: Secondary | ICD-10-CM

## 2018-10-16 NOTE — Telephone Encounter (Signed)
Last office visit on 08/28/2018 Last refill on 06/26/2018  #30 with 1 refill CSC done on 05/15/2018

## 2018-11-04 ENCOUNTER — Encounter (HOSPITAL_BASED_OUTPATIENT_CLINIC_OR_DEPARTMENT_OTHER): Payer: Self-pay

## 2018-11-04 ENCOUNTER — Ambulatory Visit (HOSPITAL_BASED_OUTPATIENT_CLINIC_OR_DEPARTMENT_OTHER)
Admission: RE | Admit: 2018-11-04 | Discharge: 2018-11-04 | Disposition: A | Discharge status: Home / Self Care | Payer: BLUE CROSS/BLUE SHIELD | Source: Ambulatory Visit | Attending: Family Medicine | Admitting: Family Medicine

## 2018-11-04 DIAGNOSIS — Z1239 Encounter for other screening for malignant neoplasm of breast: Secondary | ICD-10-CM | POA: Insufficient documentation

## 2018-11-04 DIAGNOSIS — Z1231 Encounter for screening mammogram for malignant neoplasm of breast: Secondary | ICD-10-CM | POA: Diagnosis not present

## 2018-11-27 ENCOUNTER — Ambulatory Visit: Payer: Self-pay | Admitting: Family Medicine

## 2018-12-14 ENCOUNTER — Encounter: Payer: Self-pay | Admitting: Family Medicine

## 2018-12-18 ENCOUNTER — Ambulatory Visit: Payer: Self-pay | Admitting: Family Medicine

## 2019-01-24 ENCOUNTER — Ambulatory Visit: Payer: BLUE CROSS/BLUE SHIELD | Admitting: Internal Medicine

## 2019-01-24 ENCOUNTER — Encounter (HOSPITAL_COMMUNITY): Payer: Self-pay | Admitting: Emergency Medicine

## 2019-01-24 ENCOUNTER — Encounter: Payer: Self-pay | Admitting: Internal Medicine

## 2019-01-24 ENCOUNTER — Other Ambulatory Visit: Payer: Self-pay

## 2019-01-24 ENCOUNTER — Emergency Department (HOSPITAL_COMMUNITY)
Admission: EM | Admit: 2019-01-24 | Discharge: 2019-01-25 | Disposition: A | Discharge status: Home / Self Care | Payer: BLUE CROSS/BLUE SHIELD | Attending: Emergency Medicine | Admitting: Emergency Medicine

## 2019-01-24 VITALS — BP 126/68 | HR 69 | Temp 98.1°F | Resp 16 | Ht 62.0 in | Wt 146.4 lb

## 2019-01-24 DIAGNOSIS — Z86718 Personal history of other venous thrombosis and embolism: Secondary | ICD-10-CM | POA: Diagnosis not present

## 2019-01-24 DIAGNOSIS — M542 Cervicalgia: Secondary | ICD-10-CM

## 2019-01-24 DIAGNOSIS — Z79899 Other long term (current) drug therapy: Secondary | ICD-10-CM | POA: Diagnosis not present

## 2019-01-24 DIAGNOSIS — G08 Intracranial and intraspinal phlebitis and thrombophlebitis: Secondary | ICD-10-CM | POA: Insufficient documentation

## 2019-01-24 DIAGNOSIS — R51 Headache: Secondary | ICD-10-CM | POA: Diagnosis not present

## 2019-01-24 DIAGNOSIS — H5712 Ocular pain, left eye: Secondary | ICD-10-CM | POA: Diagnosis not present

## 2019-01-24 DIAGNOSIS — R519 Headache, unspecified: Secondary | ICD-10-CM

## 2019-01-24 DIAGNOSIS — Z87891 Personal history of nicotine dependence: Secondary | ICD-10-CM | POA: Insufficient documentation

## 2019-01-24 DIAGNOSIS — Z7901 Long term (current) use of anticoagulants: Secondary | ICD-10-CM | POA: Diagnosis not present

## 2019-01-24 DIAGNOSIS — R9431 Abnormal electrocardiogram [ECG] [EKG]: Secondary | ICD-10-CM | POA: Diagnosis not present

## 2019-01-24 LAB — CBC WITH DIFFERENTIAL/PLATELET
ABS IMMATURE GRANULOCYTES: 0.02 10*3/uL (ref 0.00–0.07)
BASOS ABS: 0.1 10*3/uL (ref 0.0–0.1)
Basophils Relative: 1 %
EOS PCT: 2 %
Eosinophils Absolute: 0.2 10*3/uL (ref 0.0–0.5)
HCT: 40.9 % (ref 36.0–46.0)
HEMOGLOBIN: 12.8 g/dL (ref 12.0–15.0)
Immature Granulocytes: 0 %
LYMPHS PCT: 45 %
Lymphs Abs: 4.5 10*3/uL — ABNORMAL HIGH (ref 0.7–4.0)
MCH: 27.9 pg (ref 26.0–34.0)
MCHC: 31.3 g/dL (ref 30.0–36.0)
MCV: 89.3 fL (ref 80.0–100.0)
Monocytes Absolute: 1.1 10*3/uL — ABNORMAL HIGH (ref 0.1–1.0)
Monocytes Relative: 11 %
NRBC: 0 % (ref 0.0–0.2)
Neutro Abs: 4.1 10*3/uL (ref 1.7–7.7)
Neutrophils Relative %: 41 %
Platelets: 366 10*3/uL (ref 150–400)
RBC: 4.58 MIL/uL (ref 3.87–5.11)
RDW: 15.7 % — AB (ref 11.5–15.5)
WBC: 10 10*3/uL (ref 4.0–10.5)

## 2019-01-24 LAB — I-STAT TROPONIN, ED: Troponin i, poc: 0 ng/mL (ref 0.00–0.08)

## 2019-01-24 MED ORDER — IOPAMIDOL (ISOVUE-370) INJECTION 76%
INTRAVENOUS | Status: AC
  Administered 2019-01-25: 75 mL
  Filled 2019-01-24: qty 100

## 2019-01-24 NOTE — Progress Notes (Signed)
Pre visit review using our clinic review tool, if applicable. No additional management support is needed unless otherwise documented below in the visit note. 

## 2019-01-24 NOTE — ED Triage Notes (Signed)
Pt. Stated, I started having pain behind left ear running down my neck. I have a history of blood clots in my head , Im on blood thinners. Im afraid I have another clot. Its swelling behind my left ear.

## 2019-01-24 NOTE — Patient Instructions (Addendum)
Please go to the ER at [facility]   Advise the charge nurse I'm attempting to call the ER MD  Please call me if questions

## 2019-01-24 NOTE — ED Provider Notes (Signed)
[facility] EMERGENCY DEPARTMENT Provider Note   CSN: [id number] Arrival date & time: 01/24/19  1813    History   Chief Complaint Chief Complaint  Patient presents with  . Headache  . Neck Pain    HPI Cathy Simmons is a 53 y.o. female.     Patient with past medical history remarkable for cerebral venous sinus thrombosis, for which she is anticoagulated with Xarelto, she has been compliant with taking this medication, presents to the emergency department with a chief complaint of headache and neck pain.  She reports gradually worsening symptoms since yesterday.  States that she was seen by her primary care doctor today, who ordered a CT venogram of her head and soft tissue CT of her neck, but she was unable to have these tests performed on an outpatient basis due to insurance prior authorization.  She was sent to the emergency department for further work-up and evaluation.  Currently, she states that she does not have a bad headache.  She does still have pain on the left side of her neck, particularly behind her left ear.  She denies any trauma.  Denies any slurred speech, vision changes, numbness, weakness, or tingling.  She does also complain of some shortness of breath, but no CP.  She denies ever having had PE.  Denies any other associated symptoms.  The history is provided by the patient. No language interpreter was used.    Past Medical History:  Diagnosis Date  . Anxiety   . Asplenia   . Depression   . Family history of anesthesia complication    mother difficult to wake  . GERD (gastroesophageal reflux disease)   . History of blood clots   . Hyperlipidemia     Patient Active Problem List   Diagnosis Date Noted  . Cerebral venous sinus thrombosis 01/24/2019  . Dislocation of jaw 05/15/2018  . TMJ syndrome 05/15/2018  . Asplenia   . Anxiety     Past Surgical History:  Procedure Laterality Date  . BREAST BIOPSY Right    needle core biopsy,  benign  . BREAST EXCISIONAL BIOPSY Left    2 times left breast, benign both times  . BREAST SURGERY  breat biopsy x2 last 1995  . DILATION AND CURETTAGE OF UTERUS  x2 1984 and 1991  . excision left ganglion cyst Left   . FOREIGN BODY REMOVAL Right 01/29/2013   Procedure: EXCISION FOREIGN BODY RIGHT SMALL FINGER;  Surgeon: Nicki Reaper, MD;  Location: [facility];  Service: Orthopedics;  Laterality: Right;  . removal spleen  2000     OB History   No obstetric history on file.      Home Medications    Prior to Admission medications   Medication Sig Start Date End Date Taking? Authorizing Provider  cetirizine (ZYRTEC) 10 MG tablet Take 10 mg by mouth daily.    [provider]  Cholecalciferol (VITAMIN D-3) 1000 units CAPS Take 1,000 Units by mouth.    [provider]  clonazePAM (KLONOPIN) 0.5 MG tablet TAKE 1 TABLET BY MOUTH TWICE DAILY AS NEEDED FOR ANXIETY 10/16/18   Wendling, Jilda Roche, DO  diclofenac sodium (VOLTAREN) 1 % GEL Apply 2 g topically 4 (four) times daily. 08/28/18   Sharlene Dory, DO  esomeprazole (NEXIUM) 20 MG capsule Take 20 mg by mouth daily at 12 noon.    [provider]  FLUoxetine (PROZAC) 40 MG capsule Take 1 capsule (40 mg total)  by mouth every evening. 03/27/18   Sharlene Dory, DO  levothyroxine (SYNTHROID, LEVOTHROID) 25 MCG tablet Take 1 tablet (25 mcg total) by mouth daily before breakfast. 06/21/18   Wendling, Jilda Roche, DO  rivaroxaban (XARELTO) 20 MG TABS tablet Take 1 tablet (20 mg total) by mouth daily with supper. 06/21/18   Sharlene Dory, DO  vitamin B-12 (CYANOCOBALAMIN) 1000 MCG tablet Take 500 mcg by mouth daily.     [provider]    Family History Family History  Problem Relation Age of Onset  . Stroke Mother   . Cancer Mother        breast cancer  . Diabetes Father   . Parkinson's disease Father     Social History Social History   Tobacco Use  .  Smoking status: Former Smoker    Packs/day: 0.50    Last attempt to quit: 01/30/1999    Years since quitting: 19.9  . Smokeless tobacco: Never Used  Substance Use Topics  . Alcohol use: No  . Drug use: No     Allergies   Bactrim [sulfamethoxazole-trimethoprim] and Cymbalta [duloxetine hcl]   Review of Systems Review of Systems  All other systems reviewed and are negative.    Physical Exam Updated Vital Signs BP (!) 124/45 (BP Location: Right Arm)   Pulse (!) 18   Temp 97.8 F (36.6 C) (Oral)   Resp 20   Ht 5\' 2"  (1.575 m)   Wt 66.2 kg   SpO2 99%   BMI 26.70 kg/m   Physical Exam Vitals signs and nursing note reviewed.  Constitutional:      Appearance: She is well-developed.  HENT:     Head: Normocephalic and atraumatic.  Eyes:     Conjunctiva/sclera: Conjunctivae normal.     Pupils: Pupils are equal, round, and reactive to light.  Neck:     Musculoskeletal: Normal range of motion and neck supple.     Comments: TTP over the left lateral neck and occiput Cardiovascular:     Rate and Rhythm: Normal rate and regular rhythm.     Heart sounds: No murmur. No friction rub. No gallop.   Pulmonary:     Effort: Pulmonary effort is normal. No respiratory distress.     Breath sounds: Normal breath sounds. No wheezing or rales.  Chest:     Chest wall: No tenderness.  Abdominal:     General: Bowel sounds are normal. There is no distension.     Palpations: Abdomen is soft. There is no mass.     Tenderness: There is no abdominal tenderness. There is no guarding or rebound.  Musculoskeletal: Normal range of motion.        General: No tenderness.  Skin:    General: Skin is warm and dry.  Neurological:     Mental Status: She is alert and oriented to person, place, and time.  Psychiatric:        Behavior: Behavior normal.        Thought Content: Thought content normal.        Judgment: Judgment normal.      ED Treatments / Results  Labs (all labs ordered are listed,  but only abnormal results are displayed) Labs Reviewed  CBC WITH DIFFERENTIAL/PLATELET - Abnormal; Notable for the following components:      Result Value   RDW 15.7 (*)    Lymphs Abs 4.5 (*)    Monocytes Absolute 1.1 (*)    All other components within normal limits  BASIC METABOLIC PANEL  I-STAT TROPONIN, ED    EKG EKG Interpretation  Date/Time:  Wednesday January 24 2019 23:15:44 EST Ventricular Rate:  70 PR Interval:    QRS Duration: 107 QT Interval:  418 QTC Calculation: 451 R Axis:   65 Text Interpretation:  Sinus rhythm Nonspecific T abnormalities, anterior leads NO STEMI. No old tracing to compare Confirmed by Drema Pry [phone]) on 01/24/2019 11:40:10 PM   Radiology Ct Soft Tissue Neck W Contrast  Result Date: 01/25/2019 CLINICAL DATA:  LEFT retro-orbital pain radiating to neck for 2 days. History of dural venous sinus thrombosis. EXAM: CT VENOGRAM HEAD CT NECK WITH CONTRAST TECHNIQUE: Contiguous axial images were obtained from the base of the skull through the vertex without and with intravenous contrast. Multidetector CT imaging of the and neck was performed using the standard protocol following the bolus administration of intravenous contrast CONTRAST:  75mL ISOVUE-370 IOPAMIDOL (ISOVUE-370) INJECTION 76% COMPARISON:  None. FINDINGS: CT HEAD FINDINGS BRAIN: No intraparenchymal hemorrhage, mass effect nor midline shift. The ventricles and sulci are normal. No acute large vascular territory infarcts. No abnormal extra-axial fluid collections. Basal cisterns are patent. VASCULAR: Unremarkable. SKULL/SOFT TISSUES: No skull fracture. No significant soft tissue swelling. ORBITS/SINUSES: The included ocular globes and orbital contents are normal.Paranasal sinuses are well aerated. Mastoid air cells are well aerated. OTHER: Patent superior sagittal sinus, torcula of the Herophili, bilateral transverse, sigmoid sinuses and included internal jugular veins. No suspicious filling  defects or focal stenoses. Nonocclusive linear filling defects LEFT transverse sinus immediately distal to torcula of Herophili and sigmoid sinus at site of prior thrombosis. Normal appearance of the internal cerebral veins. CT NECK FINDINGS PHARYNX AND LARYNX: Normal.  Widely patent airway. SALIVARY GLANDS: Normal. THYROID: Bilateral thyroid nodules measuring to 12 mm. No routine indicated follow-up. This follows ACR consensus guidelines: Managing Incidental Thyroid Nodules Detected on Imaging: White Paper of the ACR Incidental Thyroid Findings Committee. J Am Coll Radiol 2015; 12:143-150. LYMPH NODES: Bilateral 11 mm short axis level IIa lymph nodes with reniform morphology, homogeneous enhancement. VASCULAR: Normal. LIMITED INTRACRANIAL: Normal. VISUALIZED ORBITS: Normal. MASTOIDS AND VISUALIZED PARANASAL SINUSES: Well-aerated. SKELETON: Nonacute. Mild mid cervical canal stenosis, moderate C5-6 neural foraminal narrowing. UPPER CHEST: Lung apices are clear. No superior mediastinal lymphadenopathy. OTHER: None. IMPRESSION: CT HEAD: 1. Normal noncontrast CT HEAD. 2. No acute dural venous sinus thrombosis. Decreased nonocclusive residual thrombus/fibrosis LEFT transverse and sigmoid sinus. CT neck: 1. No acute process in the neck. 2. Borderline lymphadenopathy may be reactive. Electronically Signed   By: Awilda Metro M.D.   On: 01/25/2019 01:15   Ct Venogram Head  Result Date: 01/25/2019 CLINICAL DATA:  LEFT retro-orbital pain radiating to neck for 2 days. History of dural venous sinus thrombosis. EXAM: CT VENOGRAM HEAD CT NECK WITH CONTRAST TECHNIQUE: Contiguous axial images were obtained from the base of the skull through the vertex without and with intravenous contrast. Multidetector CT imaging of the and neck was performed using the standard protocol following the bolus administration of intravenous contrast CONTRAST:  75mL ISOVUE-370 IOPAMIDOL (ISOVUE-370) INJECTION 76% COMPARISON:  None. FINDINGS: CT  HEAD FINDINGS BRAIN: No intraparenchymal hemorrhage, mass effect nor midline shift. The ventricles and sulci are normal. No acute large vascular territory infarcts. No abnormal extra-axial fluid collections. Basal cisterns are patent. VASCULAR: Unremarkable. SKULL/SOFT TISSUES: No skull fracture. No significant soft tissue swelling. ORBITS/SINUSES: The included ocular globes and orbital contents are normal.Paranasal sinuses are well aerated. Mastoid air cells are well aerated. OTHER: Patent superior sagittal sinus,  torcula of the Herophili, bilateral transverse, sigmoid sinuses and included internal jugular veins. No suspicious filling defects or focal stenoses. Nonocclusive linear filling defects LEFT transverse sinus immediately distal to torcula of Herophili and sigmoid sinus at site of prior thrombosis. Normal appearance of the internal cerebral veins. CT NECK FINDINGS PHARYNX AND LARYNX: Normal.  Widely patent airway. SALIVARY GLANDS: Normal. THYROID: Bilateral thyroid nodules measuring to 12 mm. No routine indicated follow-up. This follows ACR consensus guidelines: Managing Incidental Thyroid Nodules Detected on Imaging: White Paper of the ACR Incidental Thyroid Findings Committee. J Am Coll Radiol 2015; 12:143-150. LYMPH NODES: Bilateral 11 mm short axis level IIa lymph nodes with reniform morphology, homogeneous enhancement. VASCULAR: Normal. LIMITED INTRACRANIAL: Normal. VISUALIZED ORBITS: Normal. MASTOIDS AND VISUALIZED PARANASAL SINUSES: Well-aerated. SKELETON: Nonacute. Mild mid cervical canal stenosis, moderate C5-6 neural foraminal narrowing. UPPER CHEST: Lung apices are clear. No superior mediastinal lymphadenopathy. OTHER: None. IMPRESSION: CT HEAD: 1. Normal noncontrast CT HEAD. 2. No acute dural venous sinus thrombosis. Decreased nonocclusive residual thrombus/fibrosis LEFT transverse and sigmoid sinus. CT neck: 1. No acute process in the neck. 2. Borderline lymphadenopathy may be reactive.  Electronically Signed   By: Awilda Metro M.D.   On: 01/25/2019 01:15    Procedures Procedures (including critical care time)  Medications Ordered in ED Medications - No data to display   Initial Impression / Assessment and Plan / ED Course  I have reviewed the triage vital signs and the nursing notes.  Pertinent labs & imaging results that were available during my care of the patient were reviewed by me and considered in my medical decision making (see chart for details).        Patient with headache and left-sided neck pain.  Sent in by PCP to obtain CT venogram secondary to history of cerebral venous sinus thrombosis.  No numbness, weakness, vision changes, or slurred speech.  Patient states that her headache has actually improved since coming to the emergency department.  She has no fevers or chills.  Laboratory work-up is reassuring.  CT head and neck are negative for new acute findings.  CT does show improved nonocclusive residual thrombus/fibrosis of the left transverse and sigmoid sinus.  Encourage patient to continue taking her Xarelto.  We will discharged home with primary care follow-up.  Final Clinical Impressions(s) / ED Diagnoses   Final diagnoses:  Neck pain  Nonintractable headache, unspecified chronicity pattern, unspecified headache type    ED Discharge Orders    None       Roxy Horseman, PA-C 01/25/19 0344    Nira Conn, MD 01/25/19 [phone]

## 2019-01-24 NOTE — Progress Notes (Signed)
Subjective:    Patient ID: Cathy Simmons, female    DOB: Jun 11, 1966, 53 y.o.   MRN: [id number]  DOS:  01/24/2019 Type of visit - description: Acute visit The patient has complicated history of   Venous sinous  thrombosis , see summary below She is here is because yesterday, at work, developed pain behind the left ear, the pain somewhat radiates upward to the parietal area and downward  to the neck and even to the left shoulder. She also noticed some puffiness behind the left ear.  The symptoms are reminiscent of the second time she had a venous sinus thrombosis.  She is extremely concerned about it.  On September 13, 2015 she presented to outside hospital with a severe headache. She was transferred to [facility] and admitted to neuro ICU for close monitoring. A CT of her Brain revealed venous sinus thrombosis involving the left transverse sinus, left sigmoid sinus, and left internal jugular vein with no parenchymal hemorrhage. At that time, she was on oral contraceptives for treatment of ovarian cyst, however, she had been taking them for about 4-5 years. These were discontinued. An hypercoagulable panel was performed and per review of chart results from September 02, 2015 her hypercoagulable panel was negative. However, lupus anticoagulant was not included in the panel at that time.   Cathy Simmons was started on heparin and bridged to Coumadin. As an outpatient, she was switched to Xarelto for ease of use. She did quite well with no further evidence of clotting and completed a year of therapy at which time her Xarelto was discontinued. Within 24 hours of discontinuing her Xarelto she developed left neck swelling which was painful. She presented to the emergency department on September 12, 2016 with neck swelling.  A CT scan of her head which was compared to previous CT scan from September 02, 2015 revealed no intracranial abnormality. An MRI was recommended and performed on September 13, 2016. Normal MRI of the  brain without contrast. However, on September 12, 2016 and MRI venogram suggested a loss of flow related signal in the left transverse sinus, sigmoid sinus, and jugular bulb. Presumptively, this either related to new or residual dural venous sinus thrombus. No obvious new areas of thrombosis were seen. A Doppler of her left arm was performed on September 13, 2016 and revealed no evidence of DVT. Per review of hospital notes there was some concern of the possibility of extracranial clot spread. She was restarted on Xarelto and discharged.    Review of Systems Denies any fever chills. No runny nose or sore throat She admits to some cough and mild shortness of breath for 1 week.   Past Medical History:  Diagnosis Date  . Anxiety   . Asplenia   . Depression   . Family history of anesthesia complication    mother difficult to wake  . GERD (gastroesophageal reflux disease)   . History of blood clots   . Hyperlipidemia     Past Surgical History:  Procedure Laterality Date  . BREAST BIOPSY Right    needle core biopsy, benign  . BREAST EXCISIONAL BIOPSY Left    2 times left breast, benign both times  . BREAST SURGERY  breat biopsy x2 last 1995  . DILATION AND CURETTAGE OF UTERUS  x2 1984 and 1991  . excision left ganglion cyst Left   . FOREIGN BODY REMOVAL Right 01/29/2013   Procedure: EXCISION FOREIGN BODY RIGHT SMALL FINGER;  Surgeon: Nicki Reaper, MD;  Location:  [facility];  Service: Orthopedics;  Laterality: Right;  . removal spleen  2000    Social History   Socioeconomic History  . Marital status: Married    Spouse name: Not on file  . Number of children: Not on file  . Years of education: Not on file  . Highest education level: Not on file  Occupational History  . Not on file  Social Needs  . Financial resource strain: Not on file  . Food insecurity:    Worry: Not on file    Inability: Not on file  . Transportation needs:    Medical: Not on file     Non-medical: Not on file  Tobacco Use  . Smoking status: Former Smoker    Packs/day: 0.50    Last attempt to quit: 01/30/1999    Years since quitting: 19.9  . Smokeless tobacco: Never Used  Substance and Sexual Activity  . Alcohol use: No  . Drug use: No  . Sexual activity: Yes    Birth control/protection: Pill  Lifestyle  . Physical activity:    Days per week: Not on file    Minutes per session: Not on file  . Stress: Not on file  Relationships  . Social connections:    Talks on phone: Not on file    Gets together: Not on file    Attends religious service: Not on file    Active member of club or organization: Not on file    Attends meetings of clubs or organizations: Not on file    Relationship status: Not on file  . Intimate partner violence:    Fear of current or ex partner: Not on file    Emotionally abused: Not on file    Physically abused: Not on file    Forced sexual activity: Not on file  Other Topics Concern  . Not on file  Social History Narrative  . Not on file      Allergies as of 01/24/2019      Reactions   Bactrim [sulfamethoxazole-trimethoprim] Other (See Comments)   Causes a yeast infection   Cymbalta [duloxetine Hcl] Anxiety      Medication List       Accurate as of January 24, 2019  3:35 PM. Always use your most recent med list.        cetirizine 10 MG tablet Commonly known as:  ZYRTEC Take 10 mg by mouth daily.   clonazePAM 0.5 MG tablet Commonly known as:  KLONOPIN TAKE 1 TABLET BY MOUTH TWICE DAILY AS NEEDED FOR ANXIETY   diclofenac sodium 1 % Gel Commonly known as:  VOLTAREN Apply 2 g topically 4 (four) times daily.   esomeprazole 20 MG capsule Commonly known as:  NEXIUM Take 20 mg by mouth daily at 12 noon.   FLUoxetine 40 MG capsule Commonly known as:  PROZAC Take 1 capsule (40 mg total) by mouth every evening.   levothyroxine 25 MCG tablet Commonly known as:  SYNTHROID, LEVOTHROID Take 1 tablet (25 mcg total) by mouth  daily before breakfast.   rivaroxaban 20 MG Tabs tablet Commonly known as:  XARELTO Take 1 tablet (20 mg total) by mouth daily with supper.   vitamin B-12 1000 MCG tablet Commonly known as:  CYANOCOBALAMIN Take 500 mcg by mouth daily.   Vitamin D-3 25 MCG (1000 UT) Caps Take 1,000 Units by mouth.           Objective:   Physical Exam HENT:  Head:     BP 126/68 (BP Location: Left Arm, Patient Position: Sitting, Cuff Size: Small)   Pulse 69   Temp 98.1 F (36.7 C) (Oral)   Resp 16   Ht  (1.575 m)   Wt 146 lb 6 oz (66.4 kg)   LMP 08/29/2018   SpO2 95%   BMI 26.77 kg/m  General:   Well developed, NAD, BMI noted. HEENT:  Normocephalic .  Face, neck, supraclavicular areas are symmetric and atraumatic.   Throat: Symmetric, not red. Slightly tender behind the left ear: See graphic. Cervical spine: No TTP, normal range of motion. Lungs:  CTA B Normal respiratory effort, no intercostal retractions, no accessory muscle use. Heart: RRR,  no murmur.  No pretibial edema bilaterally Upper extremities: Symmetric, no swelling and nontender Skin: Not pale. Not jaundice Neurologic:  alert & oriented X3.  Speech normal, gait appropriate for age and unassisted.  EOMI.  Motor symmetric. Psych--  Cognition and judgment appear intact.  Cooperative with normal attention span and concentration.  Behavior appropriate. No anxious or depressed appearing.      Assessment     53 year old female, PMH includes asplenia, anticoagulated due to  venous sinus  thrombosis 08-2015, presents with  Neck discomfort, mild puffiness behind the left ear: Symptoms are similar to the second episode of  Venous sinus thrombosis, patient is quite concerned about it although she has been taking Xarelto without missing doses. I explained patient that clinically I cannot rule that out, she requests that a clot is rule out with certainty. I called radiology, the appropriate   test is CT venogram  of the head and CT neck soft tissue attention venous system. Orders entered, we spent a significant amount of time  trying to get insurance company to approve the test but we were not able to. At this point, after 45 minutes  I have no option but advised her to go to the ER at [facility] for further evaluation. Attempted to call the ER MD: No answer will try again ER called again : No answer I will be available for questions

## 2019-01-25 ENCOUNTER — Emergency Department (HOSPITAL_COMMUNITY): Payer: BLUE CROSS/BLUE SHIELD

## 2019-01-25 ENCOUNTER — Encounter (HOSPITAL_COMMUNITY): Payer: Self-pay | Admitting: Radiology

## 2019-01-25 DIAGNOSIS — H5712 Ocular pain, left eye: Secondary | ICD-10-CM | POA: Diagnosis not present

## 2019-01-25 DIAGNOSIS — M542 Cervicalgia: Secondary | ICD-10-CM | POA: Diagnosis not present

## 2019-01-25 LAB — BASIC METABOLIC PANEL
ANION GAP: 8 (ref 5–15)
BUN: 8 mg/dL (ref 6–20)
CALCIUM: 9.3 mg/dL (ref 8.9–10.3)
CO2: 26 mmol/L (ref 22–32)
CREATININE: 0.68 mg/dL (ref 0.44–1.00)
Chloride: 106 mmol/L (ref 98–111)
GFR calc non Af Amer: >60 mL/min (ref >60)
GLUCOSE: 86 mg/dL (ref 70–99)
Potassium: 3.5 mmol/L (ref 3.5–5.1)
Sodium: 140 mmol/L (ref 135–145)

## 2019-01-25 MED ORDER — CYCLOBENZAPRINE HCL 10 MG PO TABS: 10.0000 mg | ORAL_TABLET | Freq: Two times a day (BID) | ORAL | 0 refills | Status: DC | PRN

## 2019-01-25 NOTE — Discharge Instructions (Addendum)
Your CT scan shows improvement of your prior thrombosis.  No other acute findings were noted.  Please follow-up with your doctor.

## 2019-01-25 NOTE — ED Notes (Signed)
Patient transported to CT 

## 2019-02-12 ENCOUNTER — Encounter: Payer: Self-pay | Admitting: Family Medicine

## 2019-02-13 ENCOUNTER — Ambulatory Visit: Payer: BLUE CROSS/BLUE SHIELD | Admitting: Family

## 2019-02-13 ENCOUNTER — Encounter: Payer: Self-pay | Admitting: Family

## 2019-02-13 ENCOUNTER — Other Ambulatory Visit: Payer: Self-pay

## 2019-02-13 VITALS — BP 122/80 | HR 97 | Temp 99.5°F | Resp 16

## 2019-02-13 DIAGNOSIS — R05 Cough: Secondary | ICD-10-CM

## 2019-02-13 DIAGNOSIS — R059 Cough, unspecified: Secondary | ICD-10-CM

## 2019-02-13 DIAGNOSIS — R52 Pain, unspecified: Secondary | ICD-10-CM

## 2019-02-13 DIAGNOSIS — J101 Influenza due to other identified influenza virus with other respiratory manifestations: Secondary | ICD-10-CM | POA: Diagnosis not present

## 2019-02-13 LAB — POC INFLUENZA A&B (BINAX/QUICKVUE)
INFLUENZA B, POC: NEGATIVE
Influenza A, POC: NEGATIVE

## 2019-02-13 MED ORDER — OSELTAMIVIR PHOSPHATE 75 MG PO CAPS: 75.0000 mg | ORAL_CAPSULE | Freq: Two times a day (BID) | ORAL | 0 refills | Status: AC

## 2019-02-13 MED ORDER — CEFUROXIME AXETIL 500 MG PO TABS: 500.0000 mg | ORAL_TABLET | Freq: Two times a day (BID) | ORAL | 0 refills | Status: AC

## 2019-02-13 NOTE — Patient Instructions (Addendum)
Please begin ceftin (antibiotic) and tamiflu. Continue tylenol or motrin as needed for pain/fever. Stay well hydrated and get plenty of rest.  Call if new/worsening symptoms or if you are not improved in 3-4 days.

## 2019-02-13 NOTE — Progress Notes (Signed)
Subjective:    Patient ID: Cathy Simmons, female    DOB: 1965-12-29, 53 y.o.   MRN: [id number]  HPI  Patient is a 53 yr old female who presents today with c/o cough, body aches and fatigue. Symptoms began on Sunday night. Reports tmax early this AM.  She took nyquil.     Review of Systems See HPI  Past Medical History:  Diagnosis Date  . Anxiety   . Asplenia   . Depression   . Family history of anesthesia complication    mother difficult to wake  . GERD (gastroesophageal reflux disease)   . History of blood clots   . Hyperlipidemia      Social History   Socioeconomic History  . Marital status: Married    Spouse name: Not on file  . Number of children: Not on file  . Years of education: Not on file  . Highest education level: Not on file  Occupational History  . Not on file  Social Needs  . Financial resource strain: Not on file  . Food insecurity:    Worry: Not on file    Inability: Not on file  . Transportation needs:    Medical: Not on file    Non-medical: Not on file  Tobacco Use  . Smoking status: Former Smoker    Packs/day: 0.50    Last attempt to quit: 01/30/1999    Years since quitting: 20.0  . Smokeless tobacco: Never Used  Substance and Sexual Activity  . Alcohol use: No  . Drug use: No  . Sexual activity: Yes    Birth control/protection: Pill  Lifestyle  . Physical activity:    Days per week: Not on file    Minutes per session: Not on file  . Stress: Not on file  Relationships  . Social connections:    Talks on phone: Not on file    Gets together: Not on file    Attends religious service: Not on file    Active member of club or organization: Not on file    Attends meetings of clubs or organizations: Not on file    Relationship status: Not on file  . Intimate partner violence:    Fear of current or ex partner: Not on file    Emotionally abused: Not on file    Physically abused: Not on file    Forced sexual activity: Not on file  Other  Topics Concern  . Not on file  Social History Narrative  . Not on file    Past Surgical History:  Procedure Laterality Date  . BREAST BIOPSY Right    needle core biopsy, benign  . BREAST EXCISIONAL BIOPSY Left    2 times left breast, benign both times  . BREAST SURGERY  breat biopsy x2 last 1995  . DILATION AND CURETTAGE OF UTERUS  x2 1984 and 1991  . excision left ganglion cyst Left   . FOREIGN BODY REMOVAL Right 01/29/2013   Procedure: EXCISION FOREIGN BODY RIGHT SMALL FINGER;  Surgeon: Nicki ReaperGary R Kuzma, MD;  Location: [facility];  Service: Orthopedics;  Laterality: Right;  . removal spleen  2000    Family History  Problem Relation Age of Onset  . Stroke Mother   . Cancer Mother        breast cancer  . Diabetes Father   . Parkinson's disease Father     Allergies  Allergen Reactions  . Bactrim [Sulfamethoxazole-Trimethoprim] Other (See Comments)    Sulfa  causes yeast infections!  . Adhesive [Tape] Rash and Other (See Comments)    Band-Aids  . Cymbalta [Duloxetine Hcl] Anxiety    Current Outpatient Medications on File Prior to Visit  Medication Sig Dispense Refill  . cetirizine (ZYRTEC) 10 MG tablet Take 10 mg by mouth daily.    . Cholecalciferol (VITAMIN D-3) 1000 units CAPS Take 1,000 Units by mouth daily.     . clonazePAM (KLONOPIN) 0.5 MG tablet TAKE 1 TABLET BY MOUTH TWICE DAILY AS NEEDED FOR ANXIETY (Patient taking differently: Take 0.5 mg by mouth 2 (two) times daily as needed for anxiety. ) 30 tablet 1  . cyclobenzaprine (FLEXERIL) 10 MG tablet Take 1 tablet (10 mg total) by mouth 2 (two) times daily as needed for muscle spasms. 20 tablet 0  . diclofenac sodium (VOLTAREN) 1 % GEL Apply 2 g topically 4 (four) times daily. 1 Tube 1  . esomeprazole (NEXIUM) 20 MG capsule Take 20 mg by mouth daily at 12 noon.    Marland Kitchen FLUoxetine (PROZAC) 40 MG capsule Take 1 capsule (40 mg total) by mouth every evening. (Patient taking differently: Take 40 mg by mouth at  bedtime. ) 90 capsule 1  . levothyroxine (SYNTHROID, LEVOTHROID) 25 MCG tablet Take 1 tablet (25 mcg total) by mouth daily before breakfast. (Patient taking differently: Take 25 mcg by mouth daily before supper. ) 90 tablet 1  . rivaroxaban (XARELTO) 20 MG TABS tablet Take 1 tablet (20 mg total) by mouth daily with supper. 90 tablet 3  . vitamin B-12 (CYANOCOBALAMIN) 1000 MCG tablet Take 500-1,000 mcg by mouth daily.     Marland Kitchen albuterol (PROAIR HFA) 108 (90 Base) MCG/ACT inhaler Inhale 2 puffs into the lungs every 6 (six) hours as needed for wheezing or shortness of breath.     No current facility-administered medications on file prior to visit.     BP 122/80 (BP Location: Left Arm, Patient Position: Sitting, Cuff Size: Small)   Pulse 97   Temp 99.5 F (37.5 C) (Oral)   Resp 16   SpO2 98%       Objective:   Physical Exam Constitutional:      Appearance: She is well-developed.  HENT:     Right Ear: Ear canal normal. Tympanic membrane is retracted.     Left Ear: Ear canal normal. Tympanic membrane is retracted.  Neck:     Musculoskeletal: Neck supple.     Thyroid: No thyromegaly.  Cardiovascular:     Rate and Rhythm: Normal rate and regular rhythm.     Heart sounds: Normal heart sounds. No murmur.  Pulmonary:     Effort: Pulmonary effort is normal. No respiratory distress.     Breath sounds: Normal breath sounds. No wheezing.  Lymphadenopathy:     Cervical: Cervical adenopathy present.  Skin:    General: Skin is warm and dry.  Neurological:     Mental Status: She is alert and oriented to person, place, and time.  Psychiatric:        Behavior: Behavior normal.        Thought Content: Thought content normal.        Judgment: Judgment normal.           Assessment & Plan:  Influenza A- patient tested positive for influenza A.  Will treat with abx prophlyaxis due to asplenia with ceftin. Will also treat with tamiflu. Pt is advised as follows:  Please begin ceftin  (antibiotic) and tamiflu. Continue tylenol or motrin as needed for  pain/fever. Stay well hydrated and get plenty of rest.  Call if new/worsening symptoms or if you are not improved in 3-4 days.

## 2019-02-19 ENCOUNTER — Encounter: Payer: Self-pay | Admitting: Family Medicine

## 2019-05-19 ENCOUNTER — Encounter: Payer: Self-pay | Admitting: Family Medicine

## 2019-05-21 MED ORDER — RIVAROXABAN 20 MG PO TABS: 20.0000 mg | ORAL_TABLET | Freq: Every day | ORAL | 3 refills | Status: DC

## 2019-06-25 ENCOUNTER — Encounter: Payer: Self-pay | Admitting: Family Medicine

## 2019-06-26 ENCOUNTER — Encounter: Payer: Self-pay | Admitting: Family Medicine

## 2019-08-16 ENCOUNTER — Ambulatory Visit: Payer: Self-pay | Admitting: Family Medicine

## 2019-08-16 ENCOUNTER — Ambulatory Visit (INDEPENDENT_AMBULATORY_CARE_PROVIDER_SITE_OTHER): Payer: BC Managed Care – PPO | Admitting: Family Medicine

## 2019-08-16 ENCOUNTER — Encounter: Payer: Self-pay | Admitting: Family Medicine

## 2019-08-16 ENCOUNTER — Other Ambulatory Visit: Payer: Self-pay

## 2019-08-16 VITALS — Temp 98.0°F

## 2019-08-16 DIAGNOSIS — J069 Acute upper respiratory infection, unspecified: Secondary | ICD-10-CM

## 2019-08-16 DIAGNOSIS — K529 Noninfective gastroenteritis and colitis, unspecified: Secondary | ICD-10-CM

## 2019-08-16 MED ORDER — ONDANSETRON 4 MG PO TBDP: 4.0000 mg | ORAL_TABLET | Freq: Three times a day (TID) | ORAL | 0 refills | Status: DC | PRN

## 2019-08-16 NOTE — Progress Notes (Signed)
Chief Complaint  Patient presents with  . Nausea    one day  . Fever  . Generalized Body Aches  . Nasal Congestion    Bennie Dallas here for URI complaints. Due to COVID-19 pandemic, we are interacting via telephone. I verified patient's ID using 2 identifiers. Patient agreed to proceed with visit via this method. Patient is at home, I am at office. Patient and I are present for visit.    Duration: 1 day  Associated symptoms: Fever (100.50F), sinus congestion, shortness of breath, myalgia and nausea, slight cough Denies: sinus pain, rhinorrhea, itchy watery eyes, ear pain, ear drainage, sore throat, wheezing and vomiting, diarrhea Treatment to date: none Sick contacts: No 2 weeks ago was out of the state, but was in a cabin and was doing well with social distancing.   ROS:  Const: +fevers HEENT: As noted in HPI Lungs: No current SOB  Past Medical History:  Diagnosis Date  . Anxiety   . Asplenia   . Depression   . Family history of anesthesia complication    mother difficult to wake  . GERD (gastroesophageal reflux disease)   . History of blood clots   . Hyperlipidemia    Exam Temp 98 F (36.7 C) (Oral)  No conversational dyspnea Age appropriate judgment and insight Nml affect and mood  Gastroenteritis - Plan: ondansetron (ZOFRAN-ODT) 4 MG disintegrating tablet  URI with cough and congestion  Ck for Covid. Continue to push fluids, practice good hand hygiene, cover mouth when coughing. Total time spent: 13 min F/u prn. If starting to experience fevers, shaking, or shortness of breath, seek immediate care. Pt voiced understanding and agreement to the plan.  [place], DO 08/16/19 2:20 PM

## 2019-08-16 NOTE — Addendum Note (Signed)
Addended by: Sharon Seller B on: 08/16/2019 02:25 PM   Modules accepted: Orders

## 2019-08-16 NOTE — Telephone Encounter (Signed)
Pt reports received flu vaccine Sunday. Reports fever onset yesterday 100.4, this AM 99.4. Reports body aches, congestion, nausea, no vomiting. 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 Recommended Dr. Wendling\'s review. Pt is scheduled to work tonight. CAll transferred to practice, Kristie, for consideration of virtual appt. CAre advise given    Reason for Disposition . [1] Over 3 days (72 hours) since shot AND [2] redness, swelling or pain getting worse  Answer Assessment - Initial Assessment Questions 1. SYMPTOMS: "What is the main symptom?" (e.g., redness, swelling, pain)     Body aches, congestion, nausea, fever 2. ONSET: "When was the vaccine (shot) given?" "How much later did the *No Answer* begin?" (e.g., hours, days ago)      Sunday, onset last night, worsening congestion this AM 3. SEVERITY: "How bad is it?"     moderate 4. FEVER: "Is there a fever?" If so, ask: "What is it, how was it measured, and when did it start?"      10 0.1 yesterday. 99.4 this AM 5. IMMUNIZATIONS GIVEN: "What shots have you recently received?"     Flu shot 6. PAST REACTIONS: "Have you reacted to immunizations before?" If so, ask: "What happened?"    No reactions 7. OTHER SYMPTOMS: "Do you have any other symptoms?"    no  Protocols used: IMMUNIZATION REACTIONS-A-AH

## 2019-08-16 NOTE — Telephone Encounter (Signed)
Appt scheduled

## 2019-08-17 ENCOUNTER — Other Ambulatory Visit: Payer: Self-pay | Admitting: *Deleted

## 2019-08-17 DIAGNOSIS — Z20822 Contact with and (suspected) exposure to covid-19: Secondary | ICD-10-CM

## 2019-08-17 DIAGNOSIS — R6889 Other general symptoms and signs: Secondary | ICD-10-CM | POA: Diagnosis not present

## 2019-08-18 LAB — NOVEL CORONAVIRUS, NAA: SARS-CoV-2, NAA: NOT DETECTED

## 2019-08-29 ENCOUNTER — Encounter: Payer: Self-pay | Admitting: Family Medicine

## 2019-08-30 ENCOUNTER — Other Ambulatory Visit: Payer: Self-pay | Admitting: Family Medicine

## 2019-08-30 DIAGNOSIS — F419 Anxiety disorder, unspecified: Secondary | ICD-10-CM

## 2019-08-30 MED ORDER — CLONAZEPAM 0.5 MG PO TABS: 0.5000 mg | ORAL_TABLET | Freq: Two times a day (BID) | ORAL | 0 refills | Status: DC | PRN

## 2019-09-03 ENCOUNTER — Other Ambulatory Visit: Payer: Self-pay

## 2019-09-03 ENCOUNTER — Ambulatory Visit: Payer: BC Managed Care – PPO | Admitting: Family Medicine

## 2019-09-03 ENCOUNTER — Encounter: Payer: Self-pay | Admitting: Family Medicine

## 2019-09-03 VITALS — BP 108/62 | HR 82 | Temp 97.3°F | Ht 62.0 in | Wt 149.5 lb

## 2019-09-03 DIAGNOSIS — M722 Plantar fascial fibromatosis: Secondary | ICD-10-CM | POA: Diagnosis not present

## 2019-09-03 DIAGNOSIS — N3281 Overactive bladder: Secondary | ICD-10-CM | POA: Diagnosis not present

## 2019-09-03 DIAGNOSIS — R198 Other specified symptoms and signs involving the digestive system and abdomen: Secondary | ICD-10-CM | POA: Diagnosis not present

## 2019-09-03 DIAGNOSIS — S86819A Strain of other muscle(s) and tendon(s) at lower leg level, unspecified leg, initial encounter: Secondary | ICD-10-CM

## 2019-09-03 DIAGNOSIS — F419 Anxiety disorder, unspecified: Secondary | ICD-10-CM | POA: Diagnosis not present

## 2019-09-03 MED ORDER — MIRABEGRON ER 25 MG PO TB24: 25.0000 mg | ORAL_TABLET | Freq: Every day | ORAL | 2 refills | Status: DC

## 2019-09-03 NOTE — Patient Instructions (Addendum)
Take 1/2 tab of your Prozac daily for 2 weeks and then bump back up to a full tab daily. If you have any issues, let me know.  Aim to do some physical exertion for 150 minutes per week. This is typically divided into 5 days per week, 30 minutes per day. The activity should be enough to get your heart rate up. Anything is better than nothing if you have time constraints.  If the medicine for the urination is too expensive, let me know. Look up Kegel exercises.  Try Metamucil for bowel movements. Stay hydrated.  Please consider counseling. Contact [phone] to schedule an appointment or inquire about cost/insurance cover age.  Coping skills Choose 5 that work for you:  Take a deep breath  Count to 20  Read a book  Do a puzzle  Meditate  Bake  Sing  Knit  Garden  Pray  Go outside  Call a friend  Listen to music  Take a walk  Color  Send a note  Take a bath  Watch a movie  Be alone in a quiet place  Pet an animal  Visit a friend  Journal  Exercise  Stretch   Ice/cold pack over area for 10-15 min twice daily.  Consider a Strassburg sock.   Plantar Fasciitis Stretches/exercises Do exercises exactly as told by your health care provider and adjust them as directed. It is normal to feel mild stretching, pulling, tightness, or discomfort as you do these exercises, but you should stop right away if you feel sudden pain or your pain gets worse.   Stretching and range of motion exercises These exercises warm up your muscles and joints and improve the movement and flexibility of your foot. These exercises also help to relieve pain.  Exercise A: Plantar fascia stretch 25. Sit with your left / right leg crossed over your opposite knee. 26. Hold your heel with one hand with that thumb near your arch. With your other hand, hold your toes and gently pull them back toward the top of your foot. You should feel a stretch on the bottom of your toes or your foot  or both. 27. Hold this stretch for 30 seconds. 28. Slowly release your toes and return to the starting position. Repeat 2 times. Complete this exercise 3 times per week.  Exercise B: Gastroc, standing 1. Stand with your hands against a wall. 2. Extend your left / right leg behind you, and bend your front knee slightly. 3. Keeping your heels on the floor and keeping your back knee straight, shift your weight toward the wall without arching your back. You should feel a gentle stretch in your left / right calf. 4. Hold this position for 30 seconds. Repeat 2 times. Complete this exercise 3 times a week. Exercise C: Soleus, standing 1. Stand with your hands against a wall. 2. Extend your left / right leg behind you, and bend your front knee slightly. 3. Keeping your heels on the floor, bend your back knee and slightly shift your weight over the back leg. You should feel a gentle stretch deep in your calf. 4. Hold this position for 30 seconds. Repeat 2 times. Complete this exercise 3 times per week. Exercise D: Gastrocsoleus, standing 1. Stand with the ball of your left / right foot on a step. The ball of your foot is on the walking surface, right under your toes. 2. Keep your other foot firmly on the same step. 3. Hold onto the wall or  a railing for balance. 4. Slowly lift your other foot, allowing your body weight to press your heel down over the edge of the step. You should feel a stretch in your left / right calf. 5. Hold this position for 30 seconds. 6. Return both feet to the step. 7. Repeat this exercise with a slight bend in your left / right knee. Repeat 2 times with your left / right knee straight and 2times with your left / right knee bent. Complete this exercise 3 times a week.  Balance exercise This exercise builds your balance and strength control of your arch to help take pressure off your plantar fascia. Exercise E: Single leg stand 1. Without shoes, stand near a railing or in  a doorway. You may hold onto the railing or door frame as needed. 2. Stand on your left / right foot. Keep your big toe down on the floor and try to keep your arch lifted. Do not let your foot roll inward. 3. Hold this position for 30 seconds. 4. If this exercise is too easy, you can try it with your eyes closed or while standing on a pillow. Repeat 2 times. Complete this exercise 3 times per week. This information is not intended to replace advice given to you by your health care provider. Make sure you discuss any questions you have with your health care provider. Document Released: 11/15/2005 Document Revised: 07/20/2016 Document Reviewed: 09/29/2015 Elsevier Interactive Patient Education  2017 [employer].

## 2019-09-03 NOTE — Progress Notes (Signed)
Chief Complaint  Patient presents with  . Follow-up    Subjective Cathy Simmons presents for f/u anxiety/depression.  She is currently being treated with Prozac 40 mg/d and Klonpin 0.5 mg bid prn.  She has not been taking her Prozac over the past month.   Stress is very high. No thoughts of harming self or others. No self-medication with alcohol, prescription drugs or illicit drugs. Continuous quarantine at work due to coworkers being infected with COVID, conflict with coworkers, money issues, and familial issues are contributing to her stress. Pt is not following with a counselor/psychologist.  Patient continues to have bilateral heel pain.  She attributes it to working on concrete.  There was no other injury or change in activity.  It radiates to both of her calves.  She has not tried anything at home for this so far.  No bruising, swelling, or redness.  No recent period of inactivity or injury, there is no swelling.  No history of DVT.  The patient has a urinary issues as well.  Whenever she laughs or sneezes she will lose urine.  Additionally, whenever she has the urge to go, if she does not get to a bathroom quickly she will wet herself.  There is no pain, bleeding, or discharge.  Her bowel movements have been irregular as well.  No fevers.  Over the past several months, as her stress has increased, she has had worsening abdominal pain and alternating constipation/diarrhea.  There is no unintentional weight loss, nighttime awakenings, or bleeding.  She has not tried anything so far.  ROS Psych: No homicidal or suicidal thoughts MSK: +foot pain Skin: No redness GI: As noted in HPI GU: +freq  Const: No fevers Lungs: No sob Heart: no palpitations Endo: No weight loss Heme: No easy bruising or bleeding  Past Medical History:  Diagnosis Date  . Anxiety   . Asplenia   . Depression   . Family history of anesthesia complication    mother difficult to wake  . GERD  (gastroesophageal reflux disease)   . History of blood clots   . Hyperlipidemia    Family History  Problem Relation Age of Onset  . Stroke Mother   . Cancer Mother        breast cancer  . Diabetes Father   . Parkinson's disease Father    Allergies as of 09/03/2019      Reactions   Bactrim [sulfamethoxazole-trimethoprim] Other (See Comments)   Sulfa causes yeast infections!   Adhesive [tape] Rash, Other (See Comments)   Band-Aids   Cymbalta [duloxetine Hcl] Anxiety      Medication List       Accurate as of September 03, 2019 11:44 AM. If you have any questions, ask your nurse or doctor.        cetirizine 10 MG tablet Commonly known as: ZYRTEC Take 10 mg by mouth daily.   clonazePAM 0.5 MG tablet Commonly known as: KLONOPIN Take 1 tablet (0.5 mg total) by mouth 2 (two) times daily as needed. for anxiety   cyclobenzaprine 10 MG tablet Commonly known as: FLEXERIL Take 1 tablet (10 mg total) by mouth 2 (two) times daily as needed for muscle spasms.   diclofenac sodium 1 % Gel Commonly known as: VOLTAREN Apply 2 g topically 4 (four) times daily.   esomeprazole 20 MG capsule Commonly known as: NEXIUM Take 20 mg by mouth daily at 12 noon.   FLUoxetine 40 MG capsule Commonly known as: PROZAC Take 1 capsule (  40 mg total) by mouth every evening. What changed: when to take this   levothyroxine 25 MCG tablet Commonly known as: SYNTHROID Take 1 tablet (25 mcg total) by mouth daily before breakfast. What changed: when to take this   mirabegron ER 25 MG Tb24 tablet Commonly known as: MYRBETRIQ Take 1 tablet (25 mg total) by mouth daily. Started by: Sharlene Dory, DO   ondansetron 4 MG disintegrating tablet Commonly known as: ZOFRAN-ODT Take 1 tablet (4 mg total) by mouth every 8 (eight) hours as needed for nausea or vomiting.   ProAir HFA 108 (90 Base) MCG/ACT inhaler Generic drug: albuterol Inhale 2 puffs into the lungs every 6 (six) hours as needed for  wheezing or shortness of breath.   rivaroxaban 20 MG Tabs tablet Commonly known as: XARELTO Take 1 tablet (20 mg total) by mouth daily with supper.   vitamin B-12 1000 MCG tablet Commonly known as: CYANOCOBALAMIN Take 500-1,000 mcg by mouth daily.   Vitamin D-3 25 MCG (1000 UT) Caps Take 1,000 Units by mouth daily.       Exam BP 108/62 (BP Location: Left Arm, Patient Position: Sitting, Cuff Size: Normal)   Pulse 82   Temp (!) 97.3 F (36.3 C) (Temporal)   Ht 5\' 2"  (1.575 m)   Wt 149 lb 8 oz (67.8 kg)   SpO2 97%   BMI 27.34 kg/m  General:  well developed, well nourished, in no apparent distress Neck: neck supple without adenopathy, thyromegaly, or masses Lungs:  clear to auscultation, breath sounds equal bilaterally, no respiratory distress Cardio:  regular rate and rhythm without murmurs, heart sounds without clicks or rubs Psych: well oriented with normal range of affect and age-appropriate judgement/insight, alert and oriented x4.  Assessment and Plan  Anxiety  Bilateral plantar fasciitis  OAB (overactive bladder) - Plan: mirabegron ER (MYRBETRIQ) 25 MG TB24 tablet  Abnormal bowel movement  Strain of calf muscle, unspecified laterality, initial encounter  1-continue Klonopin, counseled on exercise, restart Prozac at half a tab daily for 2 weeks and then go to a full tab daily.  Counseling information provided.  Anxiety coping techniques were also provided. 2-stretches and exercises, ice, Strassburg sock, supportive shoes also recommended.  If no improvement, will consider injections. 3-trial Myrbetriq.  If no improvement, will consider a long-acting Ditropan.  Kegel exercises also recommended.  Mind caffeine intake. 4-sounds most likely related to situational IBS.  Will hold off on specific treatment, will see if controlling the anxiety helps with this.  Recommended daily Metamucil to help regulate stools. 5-this could be contributing to or caused by the plantar  fasciitis.  Supportive care including the stretches and exercises.  I believe she is low risk for DVT given it is bilateral and she has no other risk factors. F/u in 1 month to recheck medication. The patient voiced understanding and agreement to the plan.  [place], DO 09/03/19 11:44 AM

## 2019-09-04 ENCOUNTER — Other Ambulatory Visit: Payer: Self-pay | Admitting: Family Medicine

## 2019-09-04 ENCOUNTER — Encounter: Payer: Self-pay | Admitting: Family Medicine

## 2019-09-04 MED ORDER — OXYBUTYNIN CHLORIDE ER 10 MG PO TB24: 10.0000 mg | ORAL_TABLET | Freq: Every day | ORAL | 2 refills | Status: DC

## 2019-10-01 ENCOUNTER — Other Ambulatory Visit (HOSPITAL_BASED_OUTPATIENT_CLINIC_OR_DEPARTMENT_OTHER): Payer: Self-pay | Admitting: Family Medicine

## 2019-10-01 ENCOUNTER — Other Ambulatory Visit: Payer: Self-pay

## 2019-10-01 ENCOUNTER — Ambulatory Visit (INDEPENDENT_AMBULATORY_CARE_PROVIDER_SITE_OTHER): Payer: BC Managed Care – PPO | Admitting: Family Medicine

## 2019-10-01 ENCOUNTER — Encounter: Payer: Self-pay | Admitting: Family Medicine

## 2019-10-01 VITALS — BP 110/68 | HR 79 | Temp 97.1°F | Ht 62.0 in | Wt 149.1 lb

## 2019-10-01 DIAGNOSIS — F419 Anxiety disorder, unspecified: Secondary | ICD-10-CM

## 2019-10-01 DIAGNOSIS — Z1231 Encounter for screening mammogram for malignant neoplasm of breast: Secondary | ICD-10-CM

## 2019-10-01 DIAGNOSIS — L245 Irritant contact dermatitis due to other chemical products: Secondary | ICD-10-CM | POA: Diagnosis not present

## 2019-10-01 MED ORDER — ESCITALOPRAM OXALATE 10 MG PO TABS: 10.0000 mg | ORAL_TABLET | Freq: Every day | ORAL | 2 refills | Status: DC

## 2019-10-01 MED ORDER — TRIAMCINOLONE ACETONIDE 0.1 % EX CREA: 1.0000 "application " | TOPICAL_CREAM | Freq: Two times a day (BID) | CUTANEOUS | 0 refills | Status: DC

## 2019-10-01 NOTE — Progress Notes (Signed)
Chief Complaint  Patient presents with  . Follow-up    medication    Subjective Cathy Simmons presents for f/u anxiety/depression.  She is currently being treated with Klonopin; was supposed to be restarted on Prozac but never did 2/2 sexual AE's, did not want to bring up at last visit due ot husband being present. .  Still using twice daily.  No thoughts of harming self or others. No self-medication with alcohol, prescription drugs or illicit drugs. Failed Prozac, Cymbalta, BuSpar was OK, Celexa had AE's, Wellbutrin worked well the first, 2nd time she tried it did not work well, has been on Remeron and it made her very sedated. Pt is not following with a counselor/psychologist.   Skin lesion under L armpit. Has been there for quite some time, bled the other day. Had turned quite hard, now soft again.  After it bled, she put a bandage over the area and her skin reacted to the tape.  She has been itching and having some mild blistering over the area.  ROS Psych: No homicidal or suicidal thoughts Skin: +lesion on underarm  Past Medical History:  Diagnosis Date  . Anxiety   . Asplenia   . Depression   . Family history of anesthesia complication    mother difficult to wake  . GERD (gastroesophageal reflux disease)   . History of blood clots   . Hyperlipidemia    Exam BP 110/68 (BP Location: Left Arm, Patient Position: Sitting, Cuff Size: Normal)   Pulse 79   Temp (!) 97.1 F (36.2 C) (Temporal)   Ht 5\' 2"  (1.575 m)   Wt 149 lb 2 oz (67.6 kg)   SpO2 97%   BMI 27.28 kg/m  General:  well developed, well nourished, in no apparent distress Lungs:  no respiratory distress Skin: Fleshy and mildly hyperpigmented lesion in the left axilla over the anterior axillary line; it is not excessively warm, tender to palpation, or fluctuant.  There are also scaly and slightly erythematous patches in the shape of a piece of tape over the pectoral and in the left axillary. Psych: well  oriented with normal range of affect and age-appropriate judgement/insight, alert and oriented x4.  Assessment and Plan  Anxiety - Plan: escitalopram (LEXAPRO) 10 MG tablet  Irritant contact dermatitis due to other chemical products - Plan: triamcinolone cream (KENALOG) 0.1 %  1-continue Klonopin as needed.  Start Lexapro.  If she does not do well with this, we will trial her on Effexor.  Could consider adding back Wellbutrin.  Atypical antipsychotics will be another consideration.  She is getting set up with psychiatry. 2-Kenalog for topical irritation.  Avoid adhesives when able.  If the lesion on her arm gets her issues again, she will let us know and I will remove it. F/u in 1 mo to reck #1. The patient voiced understanding and agreement to the plan.  [place], DO 10/01/19 12:20 PM

## 2019-10-01 NOTE — Patient Instructions (Addendum)
I commend you for seeking psychiatry/psychology.   Aim to do some physical exertion for 150 minutes per week. This is typically divided into 5 days per week, 30 minutes per day. The activity should be enough to get your heart rate up. Anything is better than nothing if you have time constraints.  Coping skills Choose 5 that work for you:  Take a deep breath  Count to 20  Read a book  Do a puzzle  Meditate  Bake  Sing  Knit  Garden  Pray  Go outside  Call a friend  Listen to music  Take a walk  Color  Send a note  Take a bath  Watch a movie  Be alone in a quiet place  Pet an animal  Visit a friend  Journal  Exercise  Stretch   If the skin lesion changes, let me know, we can take off at any time.  Let us know if you need anything.

## 2019-11-05 ENCOUNTER — Ambulatory Visit: Payer: BC Managed Care – PPO | Admitting: Family Medicine

## 2019-11-05 ENCOUNTER — Other Ambulatory Visit: Payer: Self-pay

## 2019-11-05 ENCOUNTER — Ambulatory Visit (HOSPITAL_BASED_OUTPATIENT_CLINIC_OR_DEPARTMENT_OTHER)
Admission: RE | Admit: 2019-11-05 | Discharge: 2019-11-05 | Disposition: A | Discharge status: Home / Self Care | Payer: BC Managed Care – PPO | Source: Ambulatory Visit | Attending: Family Medicine | Admitting: Family Medicine

## 2019-11-05 ENCOUNTER — Encounter: Payer: Self-pay | Admitting: Family Medicine

## 2019-11-05 VITALS — BP 108/62 | HR 81 | Temp 98.0°F | Ht 62.5 in | Wt 149.2 lb

## 2019-11-05 DIAGNOSIS — Z1231 Encounter for screening mammogram for malignant neoplasm of breast: Secondary | ICD-10-CM | POA: Insufficient documentation

## 2019-11-05 DIAGNOSIS — F419 Anxiety disorder, unspecified: Secondary | ICD-10-CM | POA: Diagnosis not present

## 2019-11-05 DIAGNOSIS — M25532 Pain in left wrist: Secondary | ICD-10-CM | POA: Diagnosis not present

## 2019-11-05 MED ORDER — VENLAFAXINE HCL ER 75 MG PO CP24: 75.0000 mg | ORAL_CAPSULE | Freq: Every day | ORAL | 3 refills | Status: DC

## 2019-11-05 NOTE — Patient Instructions (Addendum)
[facility] [address] Marily Memos [place], [place] [address] [phone]  [facility] [address] [place], [place] [address] [phone]  [facility] [place], [place] [address] [phone]  [facility] [address], [place], [place], [phone] [address], [place], [place], [place]  [facility] [place], [place], [place], [place]  [facility] [place], [place]  Call one of these offices sooner than later as it can take 2-3 months to get a new patient appointment.   If you do not hear anything about your referral in the next 1-2 weeks, call our office and ask for an update.  Let us know if you need anything.   Wrist and Forearm Exercises Do exercises exactly as told by your health care provider and adjust them as directed. It is normal to feel mild stretching, pulling, tightness, or discomfort as you do these exercises, but you should stop right away if you feel sudden pain or your pain gets worse.   RANGE OF MOTION EXERCISES These exercises warm up your muscles and joints and improve the movement and flexibility of your injured wrist and forearm. These exercises also help to relieve pain, numbness, and tingling. These exercises are done using the muscles in your injured wrist and forearm. Exercise A: Wrist Flexion, Active 1. With your fingers relaxed, bend your wrist forward as far as you can. 2. Hold this position for 30 seconds. Repeat 2 times. Complete this exercise 3 times per week. Exercise B: Wrist Extension, Active 1. With your fingers relaxed, bend your wrist backward as far as you can. 2. Hold this position for 30 seconds. Repeat 2 times. Complete this exercise 3 times per  week. Exercise C: Supination, Active  1. Stand or sit with your arms at your sides. 2. Bend your left / right elbow to an "L" shape (90 degrees). 3. Turn your palm upward until you feel a gentle stretch on the inside of your forearm. 4. Hold this position for 30 seconds. 5. Slowly return your palm to the starting position. Repeat 2 times. Complete this exercise 3 times per week. Exercise D: Pronation, Active  1. Stand or sit with your arms at your sides. 2. Bend your left / right elbow to an "L" shape (90 degrees). 3. Turn your palm downward until you feel a gentle stretch on the top of your forearm. 4. Hold this position for 30 seconds. 5. Slowly return your palm to the starting position. Repeat 2 times. Complete this exercise once a day.  STRETCHING EXERCISES These exercises warm up your muscles and joints and improve the movement and flexibility of your injured wrist and forearm. These exercises also help to relieve pain, numbness, and tingling. These exercises are done using your healthy wrist and forearm to help stretch the muscles in your injured wrist and forearm. Exercise E: Wrist Flexion, Passive  1. Extend your left / right arm in front of you, relax your wrist, and point your fingers downward. 2. Gently push on the back of your hand. Stop when you feel a gentle stretch on the top of your forearm. 3. Hold this position for 30 seconds. Repeat 2 times. Complete this exercise 3 times per week. Exercise F: Wrist Extension, Passive  1. Extend your left / right arm in front of you and  turn your palm upward. 2. Gently pull your palm and fingertips back so your fingers point downward. You should feel a gentle stretch on the palm-side of your forearm. 3. Hold this position for 30 seconds. Repeat 2 times. Complete this exercise 3 times per week. Exercise G: Forearm Rotation, Supination, Passive 1. Sit with your left / right elbow bent to an "L" shape (90 degrees) with your forearm  resting on a table. 2. Keeping your upper body and shoulder still, use your other hand to rotate your forearm palm-up until you feel a gentle to moderate stretch. 3. Hold this position for 30 seconds. 4. Slowly release the stretch and return to the starting position. Repeat 2 times. Complete this exercise 3 times per week. Exercise H: Forearm Rotation, Pronation, Passive 1. Sit with your left / right elbow bent to an "L" shape (90 degrees) with your forearm resting on a table. 2. Keeping your upper body and shoulder still, use your other hand to rotate your forearm palm-down until you feel a gentle to moderate stretch. 3. Hold this position for 30 seconds. 4. Slowly release the stretch and return to the starting position. Repeat 2 times. Complete this exercise 3 times per week.  STRENGTHENING EXERCISES These exercises build strength and endurance in your wrist and forearm. Endurance is the ability to use your muscles for a long time, even after they get tired. Exercise I: Wrist Flexors  1. Sit with your left / right forearm supported on a table and your hand resting palm-up over the edge of the table. Your elbow should be bent to an "L" shape (about 90 degrees) and be below the level of your shoulder. 2. Hold a 3-5 lb weight in your left / right hand. Or, hold a rubber exercise band or tube in both hands, keeping your hands at the same level and hip distance apart. There should be a slight tension in the exercise band or tube. 3. Slowly curl your hand up toward your forearm. 4. Hold this position for 3 seconds. 5. Slowly lower your hand back to the starting position. Repeat 2 times. Complete this exercise 3 times per week. Exercise J: Wrist Extensors  1. Sit with your left / right forearm supported on a table and your hand resting palm-down over the edge of the table. Your elbow should be bent to an "L" shape (about 90 degrees) and be below the level of your shoulder. 2. Hold a 3-5 lb weight  in your left / right hand. Or, hold a rubber exercise band or tube in both hands, keeping your hands at the same level and hip distance apart. There should be a slight tension in the exercise band or tube. 3. Slowly curl your hand up toward your forearm. 4. Hold this position for 3 seconds. 5. Slowly lower your hand back to the starting position. Repeat 2 times. Complete this exercise 3 times per week. Exercise K: Forearm Rotation, Supination  1. Sit with your left / right forearm supported on a table and your hand resting palm-down. Your elbow should be at your side, bent to an "L" shape (about 90 degrees), and below the level of your shoulder. Keep your wrist stable and in a neutral position throughout the exercise. 2. Gently hold a lightweight hammer with your left / right hand. 3. Without moving your elbow or wrist, slowly rotate your palm upward to a thumbs-up position. 4. Hold this position for 3 seconds. 5. Slowly return your forearm to the  starting position. Repeat 2 times. Complete this exercise 3 times per week. Exercise L: Forearm Rotation, Pronation  1. Sit with your left / right forearm supported on a table and your hand resting palm-up. Your elbow should be at your side, bent to an "L" shape (about 90 degrees), and below the level of your shoulder. Keep your wrist stable. Do not allow it to move backward or forward during the exercise. 2. Gently hold a lightweight hammer with your left / right hand. 3. Without moving your elbow or wrist, slowly rotate your palm and hand upward to a thumbs-up position. 4. Hold this position for 3 seconds. 5. Slowly return your forearm to the starting position. Repeat 2 times. Complete this exercise 3 times per week. Exercise M: Grip Strengthening  1. Hold one of these items in your left / right hand: play dough, therapy putty, a dense sponge, a stress ball, or a large, rolled sock. 2. Squeeze as hard as you can without increasing pain. 3. Hold  this position for 5 seconds. 4. Slowly release your grip. Repeat 2 times. Complete this exercise 3 times per week.  This information is not intended to replace advice given to you by your health care provider. Make sure you discuss any questions you have with your health care provider. Document Released: 09/29/2005 Document Revised: 08/09/2016 Document Reviewed: 08/10/2015 Elsevier Interactive Patient Education  [employer].

## 2019-11-05 NOTE — Progress Notes (Signed)
Chief Complaint  Patient presents with  . Follow-up    Subjective Cathy Simmons presents for f/u anxiety/depression.  She is currently being treated with Lexapro 10 mg/d.  Reports little improvement since treatment. +decreased sexual dysfunction No thoughts of harming self or others. No self-medication with alcohol, prescription drugs or illicit drugs. Pt is not following with a counselor/psychologist.  Posterior L wrist pain over past several weeks. 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 No bruising, redness, fevers.  She does have a hx of ganglion cysts.   ROS Psych: No homicidal or suicidal thoughts MSK: +L wrist pain  Past Medical History:  Diagnosis Date  . Anxiety   . Asplenia   . Depression   . Family history of anesthesia complication    mother difficult to wake  . GERD (gastroesophageal reflux disease)   . History of blood clots   . Hyperlipidemia    Exam BP 108/62 (BP Location: Left Arm, Patient Position: Sitting, Cuff Size: Normal)   Pulse 81   Temp 98 F (36.7 C) (Temporal)   Ht 5' 2.5" (1.588 m)   Wt 149 lb 4 oz (67.7 kg)   SpO2 94%   BMI 26.86 kg/m  General:  well developed, well nourished, in no apparent distress Neuro: Sensation intact to light touch on the left hand MSK: L wrist: No tenderness to palpation, no mass or protrusion, there is no ecchymosis, edema, or erythema, there is no fluctuance or excessive warmth; strength is intact and there is no pain reproduced with resisted extension; range of motion is normal Lungs: No accessory muscle use Cardio: Brisk capillary refill Psych: well oriented with normal range of affect and age-appropriate judgement/insight, alert and oriented x4.  Assessment and Plan  Anxiety - Plan: Ambulatory referral to Psychiatry, venlafaxine XR (EFFEXOR-XR) 75 MG 24 hr capsule  Left wrist pain  1-we will stop with the SSRI class.  Replace with Effexor.  Refer to psychiatry.  Psychiatry source is also  provided in AVS.  Counseled on exercise. 2-stretches and exercises for the wrist, brace, follow-up as needed. F/u in 1 mo. The patient voiced understanding and agreement to the plan.  [place], DO 11/05/19 8:51 AM

## 2019-11-06 ENCOUNTER — Other Ambulatory Visit: Payer: Self-pay | Admitting: Family Medicine

## 2019-11-06 DIAGNOSIS — R928 Other abnormal and inconclusive findings on diagnostic imaging of breast: Secondary | ICD-10-CM

## 2019-11-14 ENCOUNTER — Other Ambulatory Visit: Payer: Self-pay

## 2019-11-14 ENCOUNTER — Ambulatory Visit
Admission: RE | Admit: 2019-11-14 | Discharge: 2019-11-14 | Disposition: A | Discharge status: Home / Self Care | Payer: BC Managed Care – PPO | Source: Ambulatory Visit | Attending: Family Medicine | Admitting: Family Medicine

## 2019-11-14 DIAGNOSIS — N6011 Diffuse cystic mastopathy of right breast: Secondary | ICD-10-CM | POA: Diagnosis not present

## 2019-11-14 DIAGNOSIS — R928 Other abnormal and inconclusive findings on diagnostic imaging of breast: Secondary | ICD-10-CM

## 2019-12-01 ENCOUNTER — Ambulatory Visit (HOSPITAL_COMMUNITY): Payer: Self-pay | Admitting: Psychiatry

## 2019-12-03 ENCOUNTER — Ambulatory Visit (INDEPENDENT_AMBULATORY_CARE_PROVIDER_SITE_OTHER): Payer: BC Managed Care – PPO | Admitting: Family Medicine

## 2019-12-03 ENCOUNTER — Other Ambulatory Visit: Payer: Self-pay

## 2019-12-03 ENCOUNTER — Encounter: Payer: Self-pay | Admitting: Family Medicine

## 2019-12-03 DIAGNOSIS — F419 Anxiety disorder, unspecified: Secondary | ICD-10-CM

## 2019-12-03 DIAGNOSIS — F329 Major depressive disorder, single episode, unspecified: Secondary | ICD-10-CM | POA: Diagnosis not present

## 2019-12-03 NOTE — Progress Notes (Signed)
CC: f/u  Subjective Cathy Simmons presents for f/u anxiety/depression. Due to COVID-19 pandemic, we are interacting via web portal for an electronic face-to-face visit. I verified patient's ID using 2 identifiers. Patient agreed to proceed with visit via this method. Patient is in parked car, I am at office. Patient, her husband and I are present for visit.   She is currently being treated with Effexor XR 75 mg/d and Klonopin 0.5 mg prn.  Reports improvement in depression, still having anxiety s/s's since treatment. Not using Klonopin daily anymore. Still having lots of stressors at home with no end in sight at this time. No thoughts of harming self or others. No self-medication with alcohol, prescription drugs or illicit drugs. Pt is not following with a counselor/psychologist.  ROS Psych: No homicidal or suicidal thoughts  Past Medical History:  Diagnosis Date  . Anxiety   . Asplenia   . Depression   . Family history of anesthesia complication    mother difficult to wake  . GERD (gastroesophageal reflux disease)   . History of blood clots   . Hyperlipidemia    Allergies as of 12/03/2019      Reactions   Bactrim [sulfamethoxazole-trimethoprim] Other (See Comments)   Sulfa causes yeast infections!   Adhesive [tape] Rash, Other (See Comments)   Band-Aids   Cymbalta [duloxetine Hcl] Anxiety      Medication List       Accurate as of December 03, 2019 12:36 PM. If you have any questions, ask your nurse or doctor.        cetirizine 10 MG tablet Commonly known as: ZYRTEC Take 10 mg by mouth daily.   clonazePAM 0.5 MG tablet Commonly known as: KLONOPIN Take 1 tablet (0.5 mg total) by mouth 2 (two) times daily as needed. for anxiety   esomeprazole 20 MG capsule Commonly known as: NEXIUM Take 20 mg by mouth daily at 12 noon.   levothyroxine 25 MCG tablet Commonly known as: SYNTHROID Take 1 tablet (25 mcg total) by mouth daily before breakfast. What changed: when to take  this   oxybutynin 10 MG 24 hr tablet Commonly known as: DITROPAN-XL Take 1 tablet (10 mg total) by mouth at bedtime.   ProAir HFA 108 (90 Base) MCG/ACT inhaler Generic drug: albuterol Inhale 2 puffs into the lungs every 6 (six) hours as needed for wheezing or shortness of breath.   rivaroxaban 20 MG Tabs tablet Commonly known as: XARELTO Take 1 tablet (20 mg total) by mouth daily with supper.   triamcinolone cream 0.1 % Commonly known as: KENALOG Apply 1 application topically 2 (two) times daily.   venlafaxine XR 75 MG 24 hr capsule Commonly known as: EFFEXOR-XR Take 1 capsule (75 mg total) by mouth daily with breakfast.   vitamin B-12 1000 MCG tablet Commonly known as: CYANOCOBALAMIN Take 500-1,000 mcg by mouth daily.   Vitamin D-3 25 MCG (1000 UT) Caps Take 1,000 Units by mouth daily.       Exam   No conversational dyspnea Age appropriate judgment and insight Nml affect and mood  Assessment and Plan  Anxiety and depression  Needs to get in w psych. She needs to call them back. Pleased w depressive symptoms. Offered to adjust med or add anxiety adjunct like Remeron, as she has failed BuSpar. She is content for now and will set up with psych.  F/u in 4 mo for CPE or prn. The patient voiced understanding and agreement to the plan.  Jilda Roche [place], DO  12/03/19 12:36 PM

## 2019-12-13 ENCOUNTER — Encounter: Payer: Self-pay | Admitting: Family Medicine

## 2019-12-13 DIAGNOSIS — F419 Anxiety disorder, unspecified: Secondary | ICD-10-CM

## 2019-12-14 MED ORDER — CLONAZEPAM 0.5 MG PO TABS: 0.5000 mg | ORAL_TABLET | Freq: Two times a day (BID) | ORAL | 0 refills | Status: DC | PRN

## 2019-12-19 ENCOUNTER — Encounter: Payer: Self-pay | Admitting: Family Medicine

## 2019-12-19 ENCOUNTER — Other Ambulatory Visit: Payer: Self-pay

## 2019-12-19 ENCOUNTER — Ambulatory Visit: Payer: BC Managed Care – PPO | Admitting: Family Medicine

## 2019-12-19 VITALS — BP 120/78 | HR 95 | Temp 97.1°F | Ht 62.0 in | Wt 150.0 lb

## 2019-12-19 DIAGNOSIS — L089 Local infection of the skin and subcutaneous tissue, unspecified: Secondary | ICD-10-CM | POA: Diagnosis not present

## 2019-12-19 MED ORDER — AMOXICILLIN-POT CLAVULANATE 875-125 MG PO TABS: 1.0000 | ORAL_TABLET | Freq: Two times a day (BID) | ORAL | 0 refills | Status: DC

## 2019-12-19 NOTE — Progress Notes (Signed)
Chief Complaint  Patient presents with  . Follow-up    knot on back of neck    Cathy Simmons is a 54 y.o. female here for a skin complaint.  Duration: 1 week Location: back of neck/behind ear Pruritic? No Painful? Yes Drainage? No New soaps/lotions/topicals/detergents? No Sick contacts? No Other associated symptoms: No ear pain, fevers, redness Therapies tried thus far: Clindamycin since 1/17.   ROS:  Const: No fevers Skin: As noted in HPI  Past Medical History:  Diagnosis Date  . Anxiety   . Asplenia   . Depression   . Family history of anesthesia complication    mother difficult to wake  . GERD (gastroesophageal reflux disease)   . History of blood clots   . Hyperlipidemia     BP 120/78 (BP Location: Left Arm, Patient Position: Sitting, Cuff Size: Normal)   Pulse 95   Temp (!) 97.1 F (36.2 C) (Temporal)   Ht 5\' 2"  (1.575 m)   Wt 150 lb (68 kg)   SpO2 96%   BMI 27.44 kg/m  Gen: awake, alert, appearing stated age Lungs: No accessory muscle use Skin: small patch of erythema over scalp with a central area of excoriation. It is warm and ttp. There is an area just posterior to post cerv chain that is swollen and ttp without erythema or fluctuance. I do not appreciate anything discrete to suggest a LN. No drainage, erythema, TTP, fluctuance, excoriation Psych: Age appropriate judgment and insight  Skin infection - Plan: amoxicillin-clavulanate (AUGMENTIN) 875-125 MG tablet  Stop Clinda, start above. If no improvement, will consider CT scan of neck.  F/u prn. The patient voiced understanding and agreement to the plan.  [place], DO 12/19/19 4:40 PM

## 2019-12-19 NOTE — Patient Instructions (Signed)
Stop the Clindamycin.   I would consider continuing the probiotic.   Fevers, worsening pain, drainage, foul odor, spreading redness should prompt you to seek care.   Ice/cold pack over area for 10-15 min twice daily.  OK to take Tylenol 1000 mg (2 extra strength tabs) or 975 mg (3 regular strength tabs) every 6 hours as needed.  Let us know if you need anything.

## 2020-02-08 ENCOUNTER — Telehealth: Payer: Self-pay | Admitting: Family Medicine

## 2020-02-08 NOTE — Telephone Encounter (Signed)
PCP completed FMLA//copied/sent to scan.... Called the patient informed done/will mail copy to her.

## 2020-03-28 ENCOUNTER — Other Ambulatory Visit (HOSPITAL_COMMUNITY)
Admission: RE | Admit: 2020-03-28 | Discharge: 2020-03-28 | Disposition: A | Discharge status: Home / Self Care | Payer: BC Managed Care – PPO | Source: Ambulatory Visit | Attending: Family Medicine | Admitting: Family Medicine

## 2020-03-28 ENCOUNTER — Encounter: Payer: Self-pay | Admitting: Family Medicine

## 2020-03-28 ENCOUNTER — Other Ambulatory Visit: Payer: Self-pay

## 2020-03-28 ENCOUNTER — Ambulatory Visit (INDEPENDENT_AMBULATORY_CARE_PROVIDER_SITE_OTHER): Payer: BC Managed Care – PPO | Admitting: Family Medicine

## 2020-03-28 VITALS — BP 112/80 | HR 89 | Temp 95.9°F | Ht 63.0 in | Wt 145.0 lb

## 2020-03-28 DIAGNOSIS — R300 Dysuria: Secondary | ICD-10-CM | POA: Diagnosis not present

## 2020-03-28 DIAGNOSIS — Z1211 Encounter for screening for malignant neoplasm of colon: Secondary | ICD-10-CM | POA: Diagnosis not present

## 2020-03-28 DIAGNOSIS — R159 Full incontinence of feces: Secondary | ICD-10-CM

## 2020-03-28 DIAGNOSIS — F321 Major depressive disorder, single episode, moderate: Secondary | ICD-10-CM

## 2020-03-28 DIAGNOSIS — Z0000 Encounter for general adult medical examination without abnormal findings: Secondary | ICD-10-CM

## 2020-03-28 DIAGNOSIS — M722 Plantar fascial fibromatosis: Secondary | ICD-10-CM | POA: Diagnosis not present

## 2020-03-28 DIAGNOSIS — F411 Generalized anxiety disorder: Secondary | ICD-10-CM

## 2020-03-28 LAB — COMPREHENSIVE METABOLIC PANEL
ALT: 19 U/L (ref 0–35)
AST: 21 U/L (ref 0–37)
Albumin: 3.9 g/dL (ref 3.5–5.2)
Alkaline Phosphatase: 105 U/L (ref 39–117)
BUN: 16 mg/dL (ref 6–23)
CO2: 31 mEq/L (ref 19–32)
Calcium: 9.3 mg/dL (ref 8.4–10.5)
Chloride: 103 mEq/L (ref 96–112)
Creatinine, Ser: 0.68 mg/dL (ref 0.40–1.20)
GFR: 89.83 mL/min (ref >60.00)
Glucose, Bld: 90 mg/dL (ref 70–99)
Potassium: 4.1 mEq/L (ref 3.5–5.1)
Sodium: 141 mEq/L (ref 135–145)
Total Bilirubin: 0.5 mg/dL (ref 0.2–1.2)
Total Protein: 6.8 g/dL (ref 6.0–8.3)

## 2020-03-28 LAB — LIPID PANEL
Cholesterol: 237 mg/dL — ABNORMAL HIGH (ref 0–200)
HDL: 55.3 mg/dL (ref >39.00)
LDL Cholesterol: 163 mg/dL — ABNORMAL HIGH (ref 0–99)
NonHDL: 181.41
Total CHOL/HDL Ratio: 4
Triglycerides: 93 mg/dL (ref 0.0–149.0)
VLDL: 18.6 mg/dL (ref 0.0–40.0)

## 2020-03-28 LAB — URINALYSIS, MICROSCOPIC ONLY

## 2020-03-28 LAB — CBC
HCT: 38.7 % (ref 36.0–46.0)
Hemoglobin: 12.8 g/dL (ref 12.0–15.0)
MCHC: 33 g/dL (ref 30.0–36.0)
MCV: 92.2 fl (ref 78.0–100.0)
Platelets: 332 10*3/uL (ref 150.0–400.0)
RBC: 4.2 Mil/uL (ref 3.87–5.11)
RDW: 14.3 % (ref 11.5–15.5)
WBC: 11.8 10*3/uL — ABNORMAL HIGH (ref 4.0–10.5)

## 2020-03-28 LAB — TSH: TSH: 1.62 u[IU]/mL (ref 0.35–4.50)

## 2020-03-28 MED ORDER — RIVAROXABAN 20 MG PO TABS: 20.0000 mg | ORAL_TABLET | Freq: Every day | ORAL | 3 refills | Status: DC

## 2020-03-28 MED ORDER — AMITRIPTYLINE HCL 25 MG PO TABS: 25.0000 mg | ORAL_TABLET | Freq: Every day | ORAL | 2 refills | Status: DC

## 2020-03-28 NOTE — Progress Notes (Signed)
Chief Complaint  Patient presents with  . Annual Exam     Well Woman Cathy Simmons is here for a complete physical.   Her last physical was >1 year ago.  Current diet: in general, an "OK" diet. Current exercise: yard work/house work. Weight is down a little and she denies daytime fatigue.  Seatbelt? Yes  Health Maintenance Pap/HPV- Due Mammogram- Yes Colon cancer screening-No Shingrix- No Tetanus- Yes Hep C screening- Yes HIV screening- Yes   GAD/Depression Patient has had worsening anxiety and depression lately.  Effexor has not been helpful.  She was set up with the psychiatry team but it was canceled due to weather and she was never contacted again to be rescheduled.  She has pain over her entire body that is getting worse.  She is not following with a Veterinary surgeon.  Many stressors with family, friends, and work are contributing.  No homicidal or suicidal ideation.  No self medication.  Heel pain Several months of heel pain without any injury or change in activity.  Standing for long periods of time appear to make it worse.  She has a history of plantar fasciitis.  Patient has a several week history of burning with urination.  She also has burning in the groin region outside of urination.  No discharge but she does have a fishy odor.  No current history of yeast infections or BV.  She has tried Monistat and Vagisil without relief.  Patient with a history of chronic abdominal issues.  Over the past couple months she has been experiencing stool incontinence and fecal urgency.  She is due for colonoscopy and would like to see a specialist.  She will have intermittent lower abdominal pain.  This is affecting her appetite and she has lost around 5 pounds since her last visit.  Past Medical History:  Diagnosis Date  . Anxiety   . Asplenia   . Depression   . Family history of anesthesia complication    mother difficult to wake  . GERD (gastroesophageal reflux disease)   . History  of blood clots   . Hyperlipidemia      Past Surgical History:  Procedure Laterality Date  . BREAST BIOPSY Right    needle core biopsy, benign  . BREAST EXCISIONAL BIOPSY Left    2 times left breast, benign both times  . BREAST SURGERY  breat biopsy x2 last 1995  . DILATION AND CURETTAGE OF UTERUS  x2 1984 and 1991  . excision left ganglion cyst Left   . FOREIGN BODY REMOVAL Right 01/29/2013   Procedure: EXCISION FOREIGN BODY RIGHT SMALL FINGER;  Surgeon: Nicki Reaper, MD;  Location: [facility];  Service: Orthopedics;  Laterality: Right;  . removal spleen  2000    Medications  Current Outpatient Medications on File Prior to Visit  Medication Sig Dispense Refill  . albuterol (PROAIR HFA) 108 (90 Base) MCG/ACT inhaler Inhale 2 puffs into the lungs every 6 (six) hours as needed for wheezing or shortness of breath.    . cetirizine (ZYRTEC) 10 MG tablet Take 10 mg by mouth daily.    . Cholecalciferol (VITAMIN D-3) 1000 units CAPS Take 1,000 Units by mouth daily.     . clonazePAM (KLONOPIN) 0.5 MG tablet Take 1 tablet (0.5 mg total) by mouth 2 (two) times daily as needed. for anxiety 30 tablet 0  . esomeprazole (NEXIUM) 20 MG capsule Take 20 mg by mouth daily at 12 noon.    Mar[place] Kitchen levothyroxine (SYNTHROID,  LEVOTHROID) 25 MCG tablet Take 1 tablet (25 mcg total) by mouth daily before breakfast. (Patient taking differently: Take 25 mcg by mouth daily before supper. ) 90 tablet 1  . oxybutynin (DITROPAN-XL) 10 MG 24 hr tablet Take 1 tablet (10 mg total) by mouth at bedtime. 30 tablet 2  . rivaroxaban (XARELTO) 20 MG TABS tablet Take 1 tablet (20 mg total) by mouth daily with supper. 90 tablet 3  . triamcinolone cream (KENALOG) 0.1 % Apply 1 application topically 2 (two) times daily. 30 g 0  . venlafaxine XR (EFFEXOR-XR) 75 MG 24 hr capsule Take 1 capsule (75 mg total) by mouth daily with breakfast. 30 capsule 3  . vitamin B-12 (CYANOCOBALAMIN) 1000 MCG tablet Take 500-1,000 mcg by mouth  daily.      Allergies Allergies  Allergen Reactions  . Bactrim [Sulfamethoxazole-Trimethoprim] Other (See Comments)    Sulfa causes yeast infections!  . Adhesive [Tape] Rash and Other (See Comments)    Band-Aids  . Cymbalta [Duloxetine Hcl] Anxiety    Review of Systems: Constitutional:  no unexpected weight changes Eye:  no recent significant change in vision Ear/Nose/Mouth/Throat:  Ears:  no recent change in hearing Nose/Mouth/Throat:  no complaints of nasal congestion, no sore throat Cardiovascular: no chest pain Respiratory:  no shortness of breath Gastrointestinal:  + Stool incontinence GU:  Female: + Dysuria Musculoskeletal/Extremities:  + Diffuse musculoskeletal and joint pain Integumentary (Skin/Breast):  no abnormal skin lesions reported Neurologic:  no headaches Endocrine:  denies fatigue Hematologic/Lymphatic:  No areas of easy bleeding  Exam BP 112/80 (BP Location: Left Arm, Patient Position: Sitting, Cuff Size: Normal)   Pulse 89   Temp (!) 95.9 F (35.5 C) (Temporal)   Ht 5\' 3"  (1.6 m)   Wt 145 lb (65.8 kg)   SpO2 96%   BMI 25.69 kg/m  General:  well developed, well nourished, in no apparent distress Skin:  no significant moles, warts, or growths Head:  no masses, lesions, or tenderness Eyes:  pupils equal and round, sclera anicteric without injection Ears:  canals without lesions, TMs shiny without retraction, no obvious effusion, no erythema Nose:  nares patent, septum midline, mucosa normal, and no drainage or sinus tenderness Throat/Pharynx:  lips and gingiva without lesion; tongue and uvula midline; non-inflamed pharynx; no exudates or postnasal drainage Neck: neck supple without adenopathy, thyromegaly, or masses Lungs:  clear to auscultation, breath sounds equal bilaterally, no respiratory distress Cardio:  regular rate and rhythm, no LE edema Abdomen:  abdomen soft, diffusely tender to palpation; bowel sounds normal; no masses or  organomegaly Genital: Defer to GYN Musculoskeletal:  + TTP over the proximal insertion of the plantar fascia of the right heel without deformity, ecchymosis, or erythema; there is no tenderness to palpation over the back or muscle bellies of the extremities; there is no TTP over the joints or instability of the ligaments noted Extremities:  no clubbing, cyanosis, or edema, no deformities, no skin discoloration Neuro:  gait normal; deep tendon reflexes normal and symmetric Psych: well oriented with normal range of affect and appropriate judgment/insight  Assessment and Plan  Well adult exam - Plan: CBC, Comprehensive metabolic panel, Lipid panel, TSH  Screen for colon cancer - Plan: Ambulatory referral to Gastroenterology  Dysuria - Plan: Urine Microscopic Only, Urine cytology ancillary only([place])  Incontinence of feces, unspecified fecal incontinence type  Plantar fasciitis, right  GAD (generalized anxiety disorder) - Plan: Ambulatory referral to Psychiatry  Depression, major, single episode, moderate ([place]) - Plan: Ambulatory  referral to Psychiatry   Well 54 y.o. female. Counseled on diet and exercise. Other orders as above. She needs to see gastroenterology for a screening colonoscopy anyway, maybe they could shed some light into her chronic issues.  I do think Elavil will help with this issue as well.  Metamucil recommended to help balance her stools. She needs to see a gynecologist.  I will check urine studies today.  If normal, she should follow-up with them for possible gynecologic etiology. I will add Elavil to help with her anxiety/depression.  She does want to see a psychiatrist so I am re-referring her.  I think this is contributing to her chronic "all over" pain. Follow up in 6 weeks if she is not set up w psych. The patient voiced understanding and agreement to the plan.  Jilda Roche [place], DO 03/28/20 11:56 AM

## 2020-03-28 NOTE — Patient Instructions (Addendum)
Call Center for [facility] at [facility] at [phone] for an appointment.  They are located at [address], [address], [place], [place], [address] (right across the hall from our office).  Give [place] 2-3 business days to get the results of your labs back.   The new Shingrix vaccine (for shingles) is a 2 shot series. It can make people feel low energy, achy and almost like they have the flu for 48 hours after injection. Please plan accordingly when deciding on when to get this shot. Call our office for a nurse visit appointment to get this. The second shot of the series is less severe regarding the side effects, but it still lasts 48 hours.   Keep the diet clean and stay active.  Aim to do some physical exertion for 150 minutes per week. This is typically divided into 5 days per week, 30 minutes per day. The activity should be enough to get your heart rate up. Anything is better than nothing if you have time constraints.   Try Metamucil to help regulate your stools.   Let us know if you need anything.  Ice/cold pack over area for 10-15 min twice daily.  Consider a [place] sock to wear at night.  Consider arch support.   Plantar Fasciitis Stretches/exercises Do exercises exactly as told by your health care provider and adjust them as directed. It is normal to feel mild stretching, pulling, tightness, or discomfort as you do these exercises, but you should stop right away if you feel sudden pain or your pain gets worse.   Stretching and range of motion exercises These exercises warm up your muscles and joints and improve the movement and flexibility of your foot. These exercises also help to relieve pain.  Exercise A: Plantar fascia stretch 1. Sit with your left / right leg crossed over your opposite knee. 2. Hold your heel with one hand with that thumb near your arch. With your other hand, hold your toes and gently pull them back toward the top of your foot. You  should feel a stretch on the bottom of your toes or your foot or both. 3. Hold this stretch for 30 seconds. 4. Slowly release your toes and return to the starting position. Repeat 2 times. Complete this exercise 3 times per week.  Exercise B: Gastroc, standing 1. Stand with your hands against a wall. 2. Extend your left / right leg behind you, and bend your front knee slightly. 3. Keeping your heels on the floor and keeping your back knee straight, shift your weight toward the wall without arching your back. You should feel a gentle stretch in your left / right calf. 4. Hold this position for 30 seconds. Repeat 2 times. Complete this exercise 3 times a week. Exercise C: Soleus, standing 1. Stand with your hands against a wall. 2. Extend your left / right leg behind you, and bend your front knee slightly. 3. Keeping your heels on the floor, bend your back knee and slightly shift your weight over the back leg. You should feel a gentle stretch deep in your calf. 4. Hold this position for 30 seconds. Repeat 2 times. Complete this exercise 3 times per week. Exercise D: Gastrocsoleus, standing 1. Stand with the ball of your left / right foot on a step. The ball of your foot is on the walking surface, right under your toes. 2. Keep your other foot firmly on the same step. 3. Hold onto the wall  or a railing for balance. 4. Slowly lift your other foot, allowing your body weight to press your heel down over the edge of the step. You should feel a stretch in your left / right calf. 5. Hold this position for 30 seconds. 6. Return both feet to the step. 7. Repeat this exercise with a slight bend in your left / right knee. Repeat 2 times with your left / right knee straight and 2times with your left / right knee bent. Complete this exercise 3 times a week.  Balance exercise This exercise builds your balance and strength control of your arch to help take pressure off your plantar fascia. Exercise E:  Single leg stand 1. Without shoes, stand near a railing or in a doorway. You may hold onto the railing or door frame as needed. 2. Stand on your left / right foot. Keep your big toe down on the floor and try to keep your arch lifted. Do not let your foot roll inward. 3. Hold this position for 30 seconds. 4. If this exercise is too easy, you can try it with your eyes closed or while standing on a pillow. Repeat 2 times. Complete this exercise 3 times per week. This information is not intended to replace advice given to you by your health care provider. Make sure you discuss any questions you have with your health care provider. Document Released: 11/15/2005 Document Revised: 07/20/2016 Document Reviewed: 09/29/2015 Elsevier Interactive Patient Education  2017 [employer].

## 2020-03-31 ENCOUNTER — Other Ambulatory Visit: Payer: Self-pay

## 2020-03-31 ENCOUNTER — Emergency Department (HOSPITAL_COMMUNITY): Payer: BC Managed Care – PPO

## 2020-03-31 ENCOUNTER — Emergency Department (HOSPITAL_COMMUNITY)
Admission: EM | Admit: 2020-03-31 | Discharge: 2020-03-31 | Disposition: A | Discharge status: Home / Self Care | Payer: BC Managed Care – PPO | Attending: Emergency Medicine | Admitting: Emergency Medicine

## 2020-03-31 DIAGNOSIS — R042 Hemoptysis: Secondary | ICD-10-CM | POA: Insufficient documentation

## 2020-03-31 DIAGNOSIS — R5383 Other fatigue: Secondary | ICD-10-CM | POA: Insufficient documentation

## 2020-03-31 DIAGNOSIS — R0789 Other chest pain: Secondary | ICD-10-CM | POA: Insufficient documentation

## 2020-03-31 DIAGNOSIS — K219 Gastro-esophageal reflux disease without esophagitis: Secondary | ICD-10-CM | POA: Insufficient documentation

## 2020-03-31 DIAGNOSIS — Z86718 Personal history of other venous thrombosis and embolism: Secondary | ICD-10-CM | POA: Insufficient documentation

## 2020-03-31 DIAGNOSIS — R079 Chest pain, unspecified: Secondary | ICD-10-CM | POA: Diagnosis not present

## 2020-03-31 DIAGNOSIS — Q8901 Asplenia (congenital): Secondary | ICD-10-CM | POA: Insufficient documentation

## 2020-03-31 DIAGNOSIS — Z7901 Long term (current) use of anticoagulants: Secondary | ICD-10-CM | POA: Diagnosis not present

## 2020-03-31 DIAGNOSIS — R42 Dizziness and giddiness: Secondary | ICD-10-CM | POA: Diagnosis not present

## 2020-03-31 LAB — CBC
HCT: 45.1 % (ref 36.0–46.0)
HCT: 44.5 % (ref 36.0–46.0)
Hemoglobin: 14.4 g/dL (ref 12.0–15.0)
Hemoglobin: 14.2 g/dL (ref 12.0–15.0)
MCH: 30.1 pg (ref 26.0–34.0)
MCH: 30.1 pg (ref 26.0–34.0)
MCHC: 31.9 g/dL (ref 30.0–36.0)
MCHC: 31.9 g/dL (ref 30.0–36.0)
MCV: 94.4 fL (ref 80.0–100.0)
MCV: 94.5 fL (ref 80.0–100.0)
Platelets: 335 10*3/uL (ref 150–400)
Platelets: 375 10*3/uL (ref 150–400)
RBC: 4.78 MIL/uL (ref 3.87–5.11)
RBC: 4.71 MIL/uL (ref 3.87–5.11)
RDW: 14.1 % (ref 11.5–15.5)
RDW: 14.1 % (ref 11.5–15.5)
WBC: 7.3 10*3/uL (ref 4.0–10.5)
WBC: 11 10*3/uL — ABNORMAL HIGH (ref 4.0–10.5)
nRBC: 0 % (ref 0.0–0.2)
nRBC: 0 % (ref 0.0–0.2)

## 2020-03-31 LAB — BASIC METABOLIC PANEL
Anion gap: 10 (ref 5–15)
BUN: 11 mg/dL (ref 6–20)
CO2: 23 mmol/L (ref 22–32)
Calcium: 9.5 mg/dL (ref 8.9–10.3)
Chloride: 106 mmol/L (ref 98–111)
Creatinine, Ser: 0.59 mg/dL (ref 0.44–1.00)
GFR calc Af Amer: >60 mL/min (ref >60)
GFR calc non Af Amer: >60 mL/min (ref >60)
Glucose, Bld: 97 mg/dL (ref 70–99)
Potassium: 4.3 mmol/L (ref 3.5–5.1)
Sodium: 139 mmol/L (ref 135–145)

## 2020-03-31 LAB — I-STAT BETA HCG BLOOD, ED (MC, WL, AP ONLY): I-stat hCG, quantitative: <5 m[IU]/mL (ref <5)

## 2020-03-31 LAB — TROPONIN I (HIGH SENSITIVITY)
Troponin I (High Sensitivity): <2 ng/L (ref <18)
Troponin I (High Sensitivity): 3 ng/L (ref <18)

## 2020-03-31 NOTE — Discharge Instructions (Signed)
Your repeat hemoglobin has not changed.  Please follow-up with your family doctor in the office.  Please return for persistent coughing up blood or if you start having chest pain or feel like you may pass out.  You can take Imodium for diarrhea.

## 2020-03-31 NOTE — ED Triage Notes (Signed)
Pt reports waking up with chest pain this morning, now having dizziness and she coughed up bright red blood once. Pt taking xarelto. Pt denies any chest pain at this time.

## 2020-03-31 NOTE — ED Notes (Signed)
Patient verbalizes understanding of discharge instructions. Opportunity for questioning and answers were provided. Armband removed by staff, pt discharged from ED stable & ambulatory  

## 2020-03-31 NOTE — ED Provider Notes (Signed)
[facility] EMERGENCY DEPARTMENT Provider Note   CSN: [id number] Arrival date & time: 03/31/20  1617     History Chief Complaint  Patient presents with  . Chest Pain  . Dizziness  . Hemoptysis    Cathy Simmons is a 54 y.o. female.  54 yo F with a chief complaints of hemoptysis.  Patient had one episode this morning.  States she woke up and she felt like she is having pain may be in her chest and may be in her upper abdomen and then when she was taking shower she coughed up some blood.  Since that has not coughed up any more.  Denies shortness of breath denies chest pain.  She is felt generally fatigued.  Decided to come to the ED.  Since she arrived in the ED she has had about 6 loose stools.  She denies dark stool or blood in her stool though did not really look.  She denies abdominal pain.  Denies fevers.  The history is provided by the patient.  Chest Pain Associated symptoms: dizziness   Associated symptoms: no fever, no headache, no nausea, no palpitations, no shortness of breath and no vomiting   Dizziness Associated symptoms: chest pain   Associated symptoms: no headaches, no nausea, no palpitations, no shortness of breath and no vomiting   Illness Severity:  Moderate Onset quality:  Gradual Duration:  1 day Timing:  Constant Progression:  Unchanged Chronicity:  New Associated symptoms: chest pain   Associated symptoms: no congestion, no fever, no headaches, no myalgias, no nausea, no rhinorrhea, no shortness of breath, no vomiting and no wheezing        Past Medical History:  Diagnosis Date  . Anxiety   . Asplenia   . Depression   . Family history of anesthesia complication    mother difficult to wake  . GERD (gastroesophageal reflux disease)   . History of blood clots   . Hyperlipidemia     Patient Active Problem List   Diagnosis Date Noted  . GAD (generalized anxiety disorder) 03/28/2020  . Depression, major, single episode,  moderate (HCC) 03/28/2020  . Bilateral plantar fasciitis 09/03/2019  . OAB (overactive bladder) 09/03/2019  . Cerebral venous sinus thrombosis 01/24/2019  . Dislocation of jaw 05/15/2018  . TMJ syndrome 05/15/2018  . Asplenia   . Anxiety and depression     Past Surgical History:  Procedure Laterality Date  . BREAST BIOPSY Right    needle core biopsy, benign  . BREAST EXCISIONAL BIOPSY Left    2 times left breast, benign both times  . BREAST SURGERY  breat biopsy x2 last 1995  . DILATION AND CURETTAGE OF UTERUS  x2 1984 and 1991  . excision left ganglion cyst Left   . FOREIGN BODY REMOVAL Right 01/29/2013   Procedure: EXCISION FOREIGN BODY RIGHT SMALL FINGER;  Surgeon: Nicki Reaper, MD;  Location: [facility];  Service: Orthopedics;  Laterality: Right;  . removal spleen  2000     OB History   No obstetric history on file.     Family History  Problem Relation Age of Onset  . Stroke Mother   . Cancer Mother        breast cancer  . Diabetes Father   . Parkinson's disease Father     Social History   Tobacco Use  . Smoking status: Former Smoker    Packs/day: 0.50    Quit date: 01/30/1999    Years since  quitting: 21.1  . Smokeless tobacco: Never Used  Substance Use Topics  . Alcohol use: No  . Drug use: No    Home Medications Prior to Admission medications   Medication Sig Start Date End Date Taking? Authorizing Provider  albuterol (PROAIR HFA) 108 (90 Base) MCG/ACT inhaler Inhale 2 puffs into the lungs every 6 (six) hours as needed for wheezing or shortness of breath.    [provider]  amitriptyline (ELAVIL) 25 MG tablet Take 1 tablet (25 mg total) by mouth at bedtime. 03/28/20   Sharlene Dory, DO  cetirizine (ZYRTEC) 10 MG tablet Take 10 mg by mouth daily.    [provider]  Cholecalciferol (VITAMIN D-3) 1000 units CAPS Take 1,000 Units by mouth daily.     [provider]  clonazePAM (KLONOPIN) 0.5 MG tablet Take 1  tablet (0.5 mg total) by mouth 2 (two) times daily as needed. for anxiety 12/14/19   Sharlene Dory, DO  esomeprazole (NEXIUM) 20 MG capsule Take 20 mg by mouth daily at 12 noon.    [provider]  levothyroxine (SYNTHROID, LEVOTHROID) 25 MCG tablet Take 1 tablet (25 mcg total) by mouth daily before breakfast. Patient taking differently: Take 25 mcg by mouth daily before supper.  06/21/18   Sharlene Dory, DO  oxybutynin (DITROPAN-XL) 10 MG 24 hr tablet Take 1 tablet (10 mg total) by mouth at bedtime. 09/04/19   Sharlene Dory, DO  rivaroxaban (XARELTO) 20 MG TABS tablet Take 1 tablet (20 mg total) by mouth daily with supper. 03/28/20   Sharlene Dory, DO  triamcinolone cream (KENALOG) 0.1 % Apply 1 application topically 2 (two) times daily. 10/01/19   Sharlene Dory, DO  vitamin B-12 (CYANOCOBALAMIN) 1000 MCG tablet Take 500-1,000 mcg by mouth daily.     [provider]    Allergies    Bactrim [sulfamethoxazole-trimethoprim], Adhesive [tape], and Cymbalta [duloxetine hcl]  Review of Systems   Review of Systems  Constitutional: Negative for chills and fever.  HENT: Negative for congestion and rhinorrhea.   Eyes: Negative for redness and visual disturbance.  Respiratory: Negative for shortness of breath and wheezing.        Hemoptysis  Cardiovascular: Positive for chest pain. Negative for palpitations.  Gastrointestinal: Negative for nausea and vomiting.  Genitourinary: Negative for dysuria and urgency.  Musculoskeletal: Negative for arthralgias and myalgias.  Skin: Negative for pallor and wound.  Neurological: Positive for dizziness. Negative for headaches.    Physical Exam Updated Vital Signs BP 124/63 (BP Location: Left Arm)   Pulse 89   Temp 97.8 F (36.6 C) (Oral)   Resp 14   Ht 5\' 2"  (1.575 m)   Wt 65.8 kg   SpO2 99%   BMI 26.52 kg/m   Physical Exam Vitals and nursing note reviewed.  Constitutional:       General: She is not in acute distress.    Appearance: She is well-developed. She is not diaphoretic.  HENT:     Head: Normocephalic and atraumatic.  Eyes:     Pupils: Pupils are equal, round, and reactive to light.  Cardiovascular:     Rate and Rhythm: Normal rate and regular rhythm.     Heart sounds: No murmur. No friction rub. No gallop.   Pulmonary:     Effort: Pulmonary effort is normal.     Breath sounds: No wheezing or rales.  Abdominal:     General: There is no distension.     Palpations:  Abdomen is soft.     Tenderness: There is no abdominal tenderness.  Musculoskeletal:        General: No tenderness.     Cervical back: Normal range of motion and neck supple.  Skin:    General: Skin is warm and dry.  Neurological:     Mental Status: She is alert and oriented to person, place, and time.  Psychiatric:        Behavior: Behavior normal.     ED Results / Procedures / Treatments   Labs (all labs ordered are listed, but only abnormal results are displayed) Labs Reviewed  CBC - Abnormal; Notable for the following components:      Result Value   WBC 11.0 (*)    All other components within normal limits  BASIC METABOLIC PANEL  CBC  I-STAT BETA HCG BLOOD, ED ([facility], [facility], [facility] ONLY)  TROPONIN I (HIGH SENSITIVITY)  TROPONIN I (HIGH SENSITIVITY)    EKG EKG Interpretation  Date/Time:  Monday Mar 31 2020 16:51:48 EDT Ventricular Rate:  97 PR Interval:  128 QRS Duration: 94 QT Interval:  356 QTC Calculation: 452 R Axis:   72 Text Interpretation: Normal sinus rhythm T wave abnormality, consider inferior ischemia Abnormal ECG No significant change since last tracing Confirmed by Deno Etienne [phone]) on 03/31/2020 8:05:01 PM   Radiology DG Chest 2 View  Result Date: 03/31/2020 CLINICAL DATA:  Chest pain. EXAM: CHEST - 2 VIEW COMPARISON:  None. FINDINGS: The heart size and mediastinal contours are within normal limits. Both lungs are clear. The visualized skeletal structures are  unremarkable. The lungs are hyperexpanded. IMPRESSION: No active cardiopulmonary disease. Electronically Signed   By: Constance Holster M.D.   On: 03/31/2020 17:28    Procedures Procedures (including critical care time)  Medications Ordered in ED Medications - No data to display  ED Course  I have reviewed the triage vital signs and the nursing notes.  Pertinent labs & imaging results that were available during my care of the patient were reviewed by me and considered in my medical decision making (see chart for details).    MDM Rules/Calculators/A&P                      54 yo F with a chief complaints of gross hemoptysis.  Patient had one episode of this this morning and is not since.  She has had diarrhea since she arrived to the ED.  She is not sure if this was dark or bloody.  Her initial hemoglobin was unremarkable.  Chest x-ray viewed by me without focal infiltrate or pneumothorax.  She is not having any chest pain or shortness of breath seems less likely of the patient has a pulmonary embolism.  She is also on Eliquis and denies any missed doses.  We will repeat a CBC since the patient had been having diarrhea and sepsis to see if she is losing blood through her GI tract.  This is unchanged.  We will discharge the patient home.  PCP follow-up.  10:44 PM:  I have discussed the diagnosis/risks/treatment options with the patient and believe the pt to be eligible for discharge home to follow-up with PCP. We also discussed returning to the ED immediately if new or worsening sx occur. We discussed the sx which are most concerning (e.g., sudden worsening pain, fever, inability to tolerate by mouth) that necessitate immediate return. Medications administered to the patient during their visit and any new prescriptions provided to the  patient are listed below.  Medications given during this visit Medications - No data to display   The patient appears reasonably screen and/or stabilized for  discharge and I doubt any other medical condition or other [facility] requiring further screening, evaluation, or treatment in the ED at this time prior to discharge.   Final Clinical Impression(s) / ED Diagnoses Final diagnoses:  Hemoptysis    Rx / DC Orders ED Discharge Orders    None       Melene Plan, DO 03/31/20 2245

## 2020-04-01 ENCOUNTER — Other Ambulatory Visit: Payer: Self-pay | Admitting: Family Medicine

## 2020-04-01 ENCOUNTER — Ambulatory Visit: Payer: BC Managed Care – PPO | Admitting: Family Medicine

## 2020-04-01 ENCOUNTER — Encounter: Payer: Self-pay | Admitting: Family Medicine

## 2020-04-01 ENCOUNTER — Other Ambulatory Visit: Payer: Self-pay

## 2020-04-01 VITALS — BP 108/68 | HR 50 | Temp 96.5°F | Ht 63.0 in | Wt 148.0 lb

## 2020-04-01 DIAGNOSIS — R319 Hematuria, unspecified: Secondary | ICD-10-CM

## 2020-04-01 DIAGNOSIS — J301 Allergic rhinitis due to pollen: Secondary | ICD-10-CM

## 2020-04-01 DIAGNOSIS — H6982 Other specified disorders of Eustachian tube, left ear: Secondary | ICD-10-CM | POA: Diagnosis not present

## 2020-04-01 DIAGNOSIS — R0600 Dyspnea, unspecified: Secondary | ICD-10-CM

## 2020-04-01 DIAGNOSIS — R042 Hemoptysis: Secondary | ICD-10-CM | POA: Diagnosis not present

## 2020-04-01 DIAGNOSIS — E7800 Pure hypercholesterolemia, unspecified: Secondary | ICD-10-CM

## 2020-04-01 MED ORDER — ROSUVASTATIN CALCIUM 20 MG PO TABS: 20.0000 mg | ORAL_TABLET | Freq: Every day | ORAL | 3 refills | Status: DC

## 2020-04-01 MED ORDER — FLUTICASONE PROPIONATE 50 MCG/ACT NA SUSP: 2.0000 | Freq: Every day | NASAL | 6 refills | Status: DC

## 2020-04-01 MED ORDER — PREDNISONE 20 MG PO TABS: 40.0000 mg | ORAL_TABLET | Freq: Every day | ORAL | 0 refills | Status: AC

## 2020-04-01 NOTE — Patient Instructions (Signed)
Send me a message on Sunday if we aren't doing better.  Continue Zyrtec.   Flonase (fluticasone); nasal spray that is over the counter. 2 sprays each nostril, once daily. Aim towards the same side eye when you spray.  There are available OTC, and the generic versions, which may be cheaper, are in parentheses. Show this to a pharmacist if you have trouble finding any of these items.  Let us know if you need anything.

## 2020-04-01 NOTE — Progress Notes (Signed)
Chief Complaint  Patient presents with  . Hospitalization Follow-up    Subjective: Patient is a 54 y.o. female here for ER follow-up.  Patient went to the ER after coughing up blood last night.  She felt chest tightness and pain that was followed by coughing up bright red blood.  She feels as though she is in a daze lately.  She has associated ear pain on the left and a left-sided sore throat.  She does have allergies.  She takes Zyrtec for this.  She does not have any chest pain currently.  She is not having any wheezing but will intermittently cough and have shortness of breath.  No calf pain or recent prolonged bedrest/surgeries.  Work-up in the emergency department is unremarkable.  Patient found to have an elevated LDL.  This is the second time.  She does have a family history of high cholesterol.  She has never had a heart attack or stroke.  She is not currently on any medication for this.  Diet is fair.  Past Medical History:  Diagnosis Date  . Anxiety   . Asplenia   . Depression   . Family history of anesthesia complication    mother difficult to wake  . GERD (gastroesophageal reflux disease)   . History of blood clots   . Hyperlipidemia     Objective: BP 108/68 (BP Location: Left Arm, Patient Position: Sitting, Cuff Size: Normal)   Pulse (!) 50   Temp (!) 96.5 F (35.8 C) (Temporal)   Ht 5\' 3"  (1.6 m)   Wt 148 lb (67.1 kg)   SpO2 98%   BMI 26.22 kg/m  General: Awake, appears stated age HEENT: MMM, EOMi, canals are patent, TMs negative on the right, mildly erythematous on the left with retraction though no fluid; there is postnasal drainage noted on the left side without exudate or erythema Heart: RRR, no bruits, no lower extremity edema Lungs: CTAB, no rales, wheezes or rhonchi. No accessory muscle use Psych: Age appropriate judgment and insight, normal affect and mood  Assessment and Plan: Dyspnea, unspecified type  Hemoptysis  Dysfunction of left eustachian  tube - Plan: fluticasone (FLONASE) 50 MCG/ACT nasal spray  Seasonal allergic rhinitis due to pollen - Plan: predniSONE (DELTASONE) 20 MG tablet, fluticasone (FLONASE) 50 MCG/ACT nasal spray  Pure hypercholesterolemia - Plan: rosuvastatin (CRESTOR) 20 MG tablet, Lipid panel, Hepatic function panel  I think we should treat the immediate issue at hand Mrs. Could explain everything.  Prednisone burst, continue Zyrtec, add Flonase.  If no improvement by the weekend, she will send a MyChart message and we will order a CT scan of the chest with contrast. She recently the physical and her LDL was again elevated.  She does have a family history so we will start Crestor.  Recheck labs in 6 weeks. The patient voiced understanding and agreement to the plan.  [place], DO 04/01/20  12:14 PM

## 2020-04-04 LAB — URINE CYTOLOGY ANCILLARY ONLY
Bacterial Vaginitis-Urine: NEGATIVE
Candida Urine: NEGATIVE
Comment: NEGATIVE
Trichomonas: NEGATIVE

## 2020-04-11 ENCOUNTER — Ambulatory Visit (INDEPENDENT_AMBULATORY_CARE_PROVIDER_SITE_OTHER): Payer: BC Managed Care – PPO | Admitting: Medical

## 2020-04-11 ENCOUNTER — Telehealth: Payer: Self-pay | Admitting: Family Medicine

## 2020-04-11 ENCOUNTER — Encounter: Payer: Self-pay | Admitting: Medical

## 2020-04-11 ENCOUNTER — Ambulatory Visit: Payer: BC Managed Care – PPO | Admitting: Family Medicine

## 2020-04-11 ENCOUNTER — Other Ambulatory Visit: Payer: Self-pay

## 2020-04-11 VITALS — BP 125/66 | HR 86 | Temp 97.3°F | Resp 18 | Ht 63.0 in | Wt 148.0 lb

## 2020-04-11 DIAGNOSIS — R319 Hematuria, unspecified: Secondary | ICD-10-CM

## 2020-04-11 DIAGNOSIS — R300 Dysuria: Secondary | ICD-10-CM

## 2020-04-11 DIAGNOSIS — F411 Generalized anxiety disorder: Secondary | ICD-10-CM

## 2020-04-11 DIAGNOSIS — R1030 Lower abdominal pain, unspecified: Secondary | ICD-10-CM

## 2020-04-11 DIAGNOSIS — Z86718 Personal history of other venous thrombosis and embolism: Secondary | ICD-10-CM

## 2020-04-11 LAB — POC URINALSYSI DIPSTICK (AUTOMATED)
Bilirubin, UA: NEGATIVE
Glucose, UA: NEGATIVE
Ketones, UA: NEGATIVE
Leukocytes, UA: NEGATIVE
Nitrite, UA: NEGATIVE
Protein, UA: NEGATIVE
Spec Grav, UA: >=1.03 — AB (ref 1.010–1.025)
Urobilinogen, UA: 0.2 E.U./dL
pH, UA: 5.5 (ref 5.0–8.0)

## 2020-04-11 MED ORDER — NITROFURANTOIN MONOHYD MACRO 100 MG PO CAPS: 100.0000 mg | ORAL_CAPSULE | Freq: Two times a day (BID) | ORAL | 0 refills | Status: DC

## 2020-04-11 NOTE — Telephone Encounter (Signed)
Pt dropped off document to be filled out by provider (FMLA paperwork - 2 pages with also front and back on paper) Pt would like to be call when document ready. Document put at front office tray under providers name.

## 2020-04-11 NOTE — Telephone Encounter (Signed)
FMLA paperwork from visit with Cathy Simmons on 04/11/20 , placed up front

## 2020-04-11 NOTE — Progress Notes (Signed)
Subjective:    Patient ID: Denton Meek, female    DOB: Jan 09, 1966, 54 y.o.   MRN: [id number]  HPI   Pt in for flma paperwork to be filled out and to go over recent medical concerns. She usually sees Dr. Carmelia Roller. He is out and she needs paperwork completed.  Pt stats hx of CVST. States had this years ago and continue xarelto.   Pt states recently had hemopytis  and went to ED. Then she followed up with pcp Dr. Carmelia Roller. Pt states pcp plan is  if any recurrent coughing up blood would need ct chest.  cxr was negative in ED. Remote hx of rare smoking 20 years ago. None since.  Pt also mentions needs to see GI for abdomen pain(pt on metamucil for her pain).   States also has to see gyn and for psychiatrist. Hx of generalized anxiety  Gyn referral will be needed for papsmear.   Pt works 3:45 pm- 3 am. Will need time off for anxiety or abdomen pain flares.  Pt does not know dates of her appointment to various specialist.  [place].When specialist call her has hard time hearing.  Recently had dysuria last week and hx of hematuria. No fever, no chills, no nausea, no vomiting and no cva pain. Looks like 2 weeks ago some blood in her urine.    Review of Systems  Constitutional: Negative for chills, fatigue and fever.  Respiratory: Negative for cough, chest tightness, shortness of breath and wheezing.   Cardiovascular: Negative for chest pain and palpitations.  Gastrointestinal: Negative for abdominal pain, constipation, diarrhea, nausea and vomiting.  Genitourinary: Positive for dysuria and frequency. Negative for difficulty urinating and vaginal pain.  Musculoskeletal: Negative for back pain.  Skin: Negative for rash.  Hematological: Negative for adenopathy. Does not bruise/bleed easily.  Psychiatric/Behavioral: Negative for behavioral problems, decreased concentration and suicidal ideas. The patient is not nervous/anxious.     Past Medical History:    Diagnosis Date  . Anxiety   . Asplenia   . Depression   . Family history of anesthesia complication    mother difficult to wake  . GERD (gastroesophageal reflux disease)   . History of blood clots   . Hyperlipidemia      Social History   Socioeconomic History  . Marital status: Married    Spouse name: Not on file  . Number of children: Not on file  . Years of education: Not on file  . Highest education level: Not on file  Occupational History  . Not on file  Tobacco Use  . Smoking status: Former Smoker    Packs/day: 0.50    Quit date: 01/30/1999    Years since quitting: 21.2  . Smokeless tobacco: Never Used  Substance and Sexual Activity  . Alcohol use: No  . Drug use: No  . Sexual activity: Yes    Birth control/protection: Pill  Other Topics Concern  . Not on file  Social History Narrative  . Not on file   Social Determinants of Health   Financial Resource Strain:   . Difficulty of Paying Living Expenses:   Food Insecurity:   . Worried About Programme researcher, broadcasting/film/video in the Last Year:   . Barista in the Last Year:   Transportation Needs:   . Freight forwarder (Medical):   Marland Kitchen Lack of Transportation (Non-Medical):   Physical Activity:   . Days of Exercise per Week:   . Minutes  of Exercise per Session:   Stress:   . Feeling of Stress :   Social Connections:   . Frequency of Communication with Friends and Family:   . Frequency of Social Gatherings with Friends and Family:   . Attends Religious Services:   . Active Member of Clubs or Organizations:   . Attends Archivist Meetings:   Marland Kitchen Marital Status:   Intimate Partner Violence:   . Fear of Current or Ex-Partner:   . Emotionally Abused:   Marland Kitchen Physically Abused:   . Sexually Abused:     Past Surgical History:  Procedure Laterality Date  . BREAST BIOPSY Right    needle core biopsy, benign  . BREAST EXCISIONAL BIOPSY Left    2 times left breast, benign both times  . BREAST SURGERY  breat  biopsy x2 last 1995  . DILATION AND CURETTAGE OF UTERUS  x2 1984 and 1991  . excision left ganglion cyst Left   . FOREIGN BODY REMOVAL Right 01/29/2013   Procedure: EXCISION FOREIGN BODY RIGHT SMALL FINGER;  Surgeon: Wynonia Sours, MD;  Location: [facility];  Service: Orthopedics;  Laterality: Right;  . removal spleen  2000    Family History  Problem Relation Age of Onset  . Stroke Mother   . Cancer Mother        breast cancer  . Diabetes Father   . Parkinson's disease Father     Allergies  Allergen Reactions  . Bactrim [Sulfamethoxazole-Trimethoprim] Other (See Comments)    Sulfa causes yeast infections!  . Adhesive [Tape] Rash and Other (See Comments)    Band-Aids  . Cymbalta [Duloxetine Hcl] Anxiety    Current Outpatient Medications on File Prior to Visit  Medication Sig Dispense Refill  . albuterol (PROAIR HFA) 108 (90 Base) MCG/ACT inhaler Inhale 2 puffs into the lungs every 6 (six) hours as needed for wheezing or shortness of breath.    Marland Kitchen amitriptyline (ELAVIL) 25 MG tablet Take 1 tablet (25 mg total) by mouth at bedtime. 30 tablet 2  . cetirizine (ZYRTEC) 10 MG tablet Take 10 mg by mouth daily.    . Cholecalciferol (VITAMIN D-3) 1000 units CAPS Take 1,000 Units by mouth daily.     . clonazePAM (KLONOPIN) 0.5 MG tablet Take 1 tablet (0.5 mg total) by mouth 2 (two) times daily as needed. for anxiety 30 tablet 0  . esomeprazole (NEXIUM) 20 MG capsule Take 20 mg by mouth daily at 12 noon.    . fluticasone (FLONASE) 50 MCG/ACT nasal spray Place 2 sprays into both nostrils daily. 16 g 6  . levothyroxine (SYNTHROID, LEVOTHROID) 25 MCG tablet Take 1 tablet (25 mcg total) by mouth daily before breakfast. (Patient taking differently: Take 25 mcg by mouth daily before supper. ) 90 tablet 1  . oxybutynin (DITROPAN-XL) 10 MG 24 hr tablet Take 1 tablet (10 mg total) by mouth at bedtime. 30 tablet 2  . rivaroxaban (XARELTO) 20 MG TABS tablet Take 1 tablet (20 mg total) by  mouth daily with supper. 90 tablet 3  . rosuvastatin (CRESTOR) 20 MG tablet Take 1 tablet (20 mg total) by mouth daily. 90 tablet 3  . triamcinolone cream (KENALOG) 0.1 % Apply 1 application topically 2 (two) times daily. 30 g 0  . vitamin B-12 (CYANOCOBALAMIN) 1000 MCG tablet Take 500-1,000 mcg by mouth daily.      No current facility-administered medications on file prior to visit.    BP 125/66 (BP Location: Left Arm, Patient Position:  Sitting, Cuff Size: Normal)   Pulse 86   Temp (!) 97.3 F (36.3 C) (Temporal)   Resp 18   Ht 5\' 3"  (1.6 m)   Wt 148 lb (67.1 kg)   SpO2 98%   BMI 26.22 kg/m       Objective:   Physical Exam   General Mental Status- Alert. General Appearance- Not in acute distress.   Skin General: Color- Normal Color. Moisture- Normal Moisture.  Neck Carotid Arteries- Normal color. Moisture- Normal Moisture. No carotid bruits. No JVD.  Chest and Lung Exam Auscultation: Breath Sounds:-Normal.  Cardiovascular Auscultation:Rythm- Regular. Murmurs & Other Heart Sounds:Auscultation of the heart reveals- No Murmurs.  Abdomen Inspection:-Inspeection Normal. Palpation/Percussion:Note:No mass. Palpation and Percussion of the abdomen reveal- Non Tender, Non Distended + BS, no rebound or guarding.    Neurologic Cranial Nerve exam:- CN III-XII intact(No nystagmus), symmetric smile. Strength:- 5/5 equal and symmetric strength both upper and lower extremities.     Assessment & Plan:  You have recent dysuria and history of blood in urine.  We will go ahead and get urine culture and prescribe Macrobid antibiotic.  Need to repeat your urine in 2 weeks.  Might consider referral to urologist if blood persists.  For history of CVst continue with Xarelto.  For history of generalized anxiety and depression continue with Elavil and clonazepam.  Referral to psychiatrist pending.  Went ahead and filled out FMLA paperwork for psychiatric diagnosis.  For recent  abdomen pain would recommend continue with Metamucil and follow-up with gastroenterologist.  Filled out FMLA paperwork regarding this condition as well.  For history of hemoptysis, did remind patient to follow Dr. hemoptysis did not follow through with CT and consider pulmonology referral.  Follow-up in 2 weeks or as needed.  Time spent with patient today was 40   minutes which consisted of chart review, discussing diagnoses, work up, referrals, going over FMLA forms, treatment and documentation.

## 2020-04-11 NOTE — Patient Instructions (Signed)
You have recent dysuria and history of blood in urine.  We will go ahead and get urine culture and prescribe Macrobid antibiotic.  Need to repeat your urine in 2 weeks.  Might consider referral to urologist if blood persists.  For history of CVst continue with Xarelto.  For history of generalized anxiety and depression continue with Elavil and clonazepam.  Referral to psychiatrist pending.  Went ahead and filled out FMLA paperwork for psychiatric diagnosis.  For recent abdomen pain would recommend continue with Metamucil and follow-up with gastroenterologist.  Filled out FMLA paperwork regarding this condition as well.  For history of hemoptysis, did remind patient to follow Dr. Hollie Beach hemoptysis did not follow through with CT and consider pulmonology referral.  Follow-up in 2 weeks or as needed.

## 2020-04-11 NOTE — Telephone Encounter (Signed)
FMLA paperwork for Husband has been placed on Robin's desk

## 2020-04-12 ENCOUNTER — Telehealth: Payer: Self-pay | Admitting: Medical

## 2020-04-12 NOTE — Telephone Encounter (Signed)
Opened to review 

## 2020-04-13 ENCOUNTER — Telehealth: Payer: Self-pay | Admitting: Medical

## 2020-04-13 LAB — URINE CULTURE
MICRO NUMBER:: 10479154
SPECIMEN QUALITY:: ADEQUATE

## 2020-04-13 MED ORDER — AMOXICILLIN-POT CLAVULANATE 875-125 MG PO TABS: 1.0000 | ORAL_TABLET | Freq: Two times a day (BID) | ORAL | 0 refills | Status: DC

## 2020-04-13 NOTE — Telephone Encounter (Signed)
Rx augmentin sent to pt pharmacy. 

## 2020-04-14 NOTE — Telephone Encounter (Signed)
Paper work is in [employer].

## 2020-04-23 ENCOUNTER — Telehealth: Payer: Self-pay | Admitting: *Deleted

## 2020-04-23 NOTE — Telephone Encounter (Signed)
Pt has lab appointment tomorrow morning. The only lab order open is a urine micro from 04/01/20.  Is this still needed or are there other labs the pt needs?

## 2020-04-23 NOTE — Telephone Encounter (Signed)
Ok, I see them now. I will change them to future. It looks like they were ordered as routine in the 5/4 office visit. The urine test was the only one that came up as future. Thanks!

## 2020-04-23 NOTE — Addendum Note (Signed)
Addended by: Mervin Kung A on: 04/23/2020 03:43 PM   Modules accepted: Orders

## 2020-04-23 NOTE — Telephone Encounter (Signed)
She has future blood work labs ordered also from date 5/4 w lipid panel and hep function panel. I would like those drawn tomorrow as well please. Ty.

## 2020-04-25 ENCOUNTER — Other Ambulatory Visit: Payer: Self-pay

## 2020-04-25 ENCOUNTER — Other Ambulatory Visit: Payer: Self-pay | Admitting: Family Medicine

## 2020-04-25 ENCOUNTER — Other Ambulatory Visit: Payer: BC Managed Care – PPO

## 2020-04-25 ENCOUNTER — Other Ambulatory Visit (INDEPENDENT_AMBULATORY_CARE_PROVIDER_SITE_OTHER): Payer: BC Managed Care – PPO

## 2020-04-25 DIAGNOSIS — E7800 Pure hypercholesterolemia, unspecified: Secondary | ICD-10-CM

## 2020-04-25 DIAGNOSIS — R319 Hematuria, unspecified: Secondary | ICD-10-CM | POA: Diagnosis not present

## 2020-04-25 DIAGNOSIS — R300 Dysuria: Secondary | ICD-10-CM

## 2020-04-25 LAB — LIPID PANEL
Cholesterol: 154 mg/dL (ref 0–200)
HDL: 50.3 mg/dL (ref >39.00)
LDL Cholesterol: 89 mg/dL (ref 0–99)
NonHDL: 103.38
Total CHOL/HDL Ratio: 3
Triglycerides: 71 mg/dL (ref 0.0–149.0)
VLDL: 14.2 mg/dL (ref 0.0–40.0)

## 2020-04-25 LAB — URINALYSIS, MICROSCOPIC ONLY

## 2020-04-25 LAB — HEPATIC FUNCTION PANEL
ALT: 17 U/L (ref 0–35)
AST: 18 U/L (ref 0–37)
Albumin: 4 g/dL (ref 3.5–5.2)
Alkaline Phosphatase: 96 U/L (ref 39–117)
Bilirubin, Direct: 0.1 mg/dL (ref 0.0–0.3)
Total Bilirubin: 0.3 mg/dL (ref 0.2–1.2)
Total Protein: 6.8 g/dL (ref 6.0–8.3)

## 2020-04-25 MED ORDER — NITROFURANTOIN MONOHYD MACRO 100 MG PO CAPS: 100.0000 mg | ORAL_CAPSULE | Freq: Two times a day (BID) | ORAL | 0 refills | Status: AC

## 2020-04-25 NOTE — Progress Notes (Signed)
urine

## 2020-04-25 NOTE — Addendum Note (Signed)
Addended by: Mervin Kung A on: 04/25/2020 02:59 PM   Modules accepted: Orders

## 2020-04-25 NOTE — Addendum Note (Signed)
Addended by: Scharlene Gloss B on: 04/25/2020 03:02 PM   Modules accepted: Orders

## 2020-04-27 LAB — URINE CULTURE
MICRO NUMBER:: 10532340
SPECIMEN QUALITY:: ADEQUATE

## 2020-05-12 ENCOUNTER — Ambulatory Visit (INDEPENDENT_AMBULATORY_CARE_PROVIDER_SITE_OTHER): Payer: BC Managed Care – PPO | Admitting: Family Medicine

## 2020-05-12 ENCOUNTER — Encounter: Payer: Self-pay | Admitting: Family Medicine

## 2020-05-12 ENCOUNTER — Other Ambulatory Visit: Payer: Self-pay

## 2020-05-12 VITALS — BP 118/80 | HR 78 | Temp 95.9°F | Ht 62.0 in | Wt 149.2 lb

## 2020-05-12 DIAGNOSIS — F419 Anxiety disorder, unspecified: Secondary | ICD-10-CM | POA: Diagnosis not present

## 2020-05-12 DIAGNOSIS — R300 Dysuria: Secondary | ICD-10-CM | POA: Diagnosis not present

## 2020-05-12 DIAGNOSIS — F32A Depression, unspecified: Secondary | ICD-10-CM

## 2020-05-12 DIAGNOSIS — N3281 Overactive bladder: Secondary | ICD-10-CM

## 2020-05-12 DIAGNOSIS — F329 Major depressive disorder, single episode, unspecified: Secondary | ICD-10-CM

## 2020-05-12 LAB — URINALYSIS, MICROSCOPIC ONLY

## 2020-05-12 LAB — POC URINALSYSI DIPSTICK (AUTOMATED)
Bilirubin, UA: NEGATIVE
Glucose, UA: NEGATIVE
Ketones, UA: NEGATIVE
Leukocytes, UA: NEGATIVE
Nitrite, UA: NEGATIVE
Protein, UA: NEGATIVE
Spec Grav, UA: >=1.03 — AB (ref 1.010–1.025)
Urobilinogen, UA: 0.2 E.U./dL
pH, UA: 6 (ref 5.0–8.0)

## 2020-05-12 NOTE — Progress Notes (Signed)
Chief Complaint  Patient presents with  . Follow-up    6 month    Subjective: Patient is a 54 y.o. female here for f/u.  Pt is no longer having hemoptysis.  Pt continues to have dysuria. This makes 2 mo. Associated LLQ abd pain and alternating const/diarrhea.  No discharge, bleeding, fevers. She is having quite a bit of stress from work.   Past Medical History:  Diagnosis Date  . Anxiety   . Asplenia   . Depression   . Family history of anesthesia complication    mother difficult to wake  . GERD (gastroesophageal reflux disease)   . History of blood clots   . Hyperlipidemia     Objective: BP 118/80 (BP Location: Left Arm, Patient Position: Sitting, Cuff Size: Normal)   Pulse 78   Temp (!) 95.9 F (35.5 C) (Temporal)   Ht 5\' 2"  (1.575 m)   Wt 149 lb 4 oz (67.7 kg)   SpO2 97%   BMI 27.30 kg/m  General: Awake, appears stated age Heart: RRR Lungs: CTAB, no rales, wheezes or rhonchi. No accessory muscle use Abd: BS+, S, ttp in lower quad, ND Psych: Age appropriate judgment and insight, normal affect and mood  Assessment and Plan: Dysuria - Plan: POCT Urinalysis Dipstick (Automated), Urine Culture, Urine Microscopic Only  OAB (overactive bladder) - Plan: POCT Urinalysis Dipstick (Automated), Urine Culture, Urine Microscopic Only  Anxiety and depression  Ck UA. Suspect IC or possibly pelvic floor dysfunction. She has a pap appt w GYN in 2 weeks. If speculum exam particularly painful, will set up with pelvic floor PT. Letter for more freq bathroom breaks provided today. I think her anxiety/depression stems a lot from her stress from job. Likely has IBS. Needs to get records from her previous GI.  F/u in 6 mo or prn.  The patient voiced understanding and agreement to the plan.  [place], DO 05/12/20  8:50 AM

## 2020-05-12 NOTE — Patient Instructions (Addendum)
Stay hydrated.  If you have a high amount of pain with your pelvic exam on 6/28, let me or your GYN know.   Get your records from your prior GI's office.  Keep the diet clean and stay active.  Let us know if you need anything.

## 2020-05-13 ENCOUNTER — Other Ambulatory Visit: Payer: Self-pay | Admitting: Family Medicine

## 2020-05-13 DIAGNOSIS — R3129 Other microscopic hematuria: Secondary | ICD-10-CM

## 2020-05-13 LAB — URINE CULTURE
MICRO NUMBER:: 10587397
SPECIMEN QUALITY:: ADEQUATE

## 2020-05-23 MED ORDER — RIVAROXABAN 20 MG PO TABS: 20.0000 mg | ORAL_TABLET | Freq: Every day | ORAL | 3 refills | Status: DC

## 2020-05-26 ENCOUNTER — Other Ambulatory Visit (HOSPITAL_COMMUNITY)
Admission: RE | Admit: 2020-05-26 | Discharge: 2020-05-26 | Disposition: A | Discharge status: Home / Self Care | Payer: BC Managed Care – PPO | Source: Ambulatory Visit | Attending: Obstetrics & Gynecology | Admitting: Obstetrics & Gynecology

## 2020-05-26 ENCOUNTER — Ambulatory Visit (INDEPENDENT_AMBULATORY_CARE_PROVIDER_SITE_OTHER): Payer: BC Managed Care – PPO | Admitting: Obstetrics & Gynecology

## 2020-05-26 ENCOUNTER — Encounter: Payer: Self-pay | Admitting: Obstetrics & Gynecology

## 2020-05-26 ENCOUNTER — Other Ambulatory Visit: Payer: Self-pay

## 2020-05-26 VITALS — BP 99/58 | HR 74 | Ht 62.0 in | Wt 148.0 lb

## 2020-05-26 DIAGNOSIS — Z01419 Encounter for gynecological examination (general) (routine) without abnormal findings: Secondary | ICD-10-CM | POA: Diagnosis not present

## 2020-05-26 DIAGNOSIS — Z803 Family history of malignant neoplasm of breast: Secondary | ICD-10-CM | POA: Insufficient documentation

## 2020-05-26 NOTE — Patient Instructions (Signed)
Menopause Menopause is the normal time of life when menstrual periods stop completely. It is usually confirmed by 12 months without a menstrual period. The transition to menopause (perimenopause) most often happens between the ages of 45 and 55. During perimenopause, hormone levels change in your body, which can cause symptoms and affect your health. Menopause may increase your risk for:  Loss of bone (osteoporosis), which causes bone breaks (fractures).  Depression.  Hardening and narrowing of the arteries (atherosclerosis), which can cause heart attacks and strokes. What are the causes? This condition is usually caused by a natural change in hormone levels that happens as you get older. The condition may also be caused by surgery to remove both ovaries (bilateral oophorectomy). What increases the risk? This condition is more likely to start at an earlier age if you have certain medical conditions or treatments, including:  A tumor of the pituitary gland in the brain.  A disease that affects the ovaries and hormone production.  Radiation treatment for cancer.  Certain cancer treatments, such as chemotherapy or hormone (anti-estrogen) therapy.  Heavy smoking and excessive alcohol use.  Family history of early menopause. This condition is also more likely to develop earlier in women who are very thin. What are the signs or symptoms? Symptoms of this condition include:  Hot flashes.  Irregular menstrual periods.  Night sweats.  Changes in feelings about sex. This could be a decrease in sex drive or an increased comfort around your sexuality.  Vaginal dryness and thinning of the vaginal walls. This may cause painful intercourse.  Dryness of the skin and development of wrinkles.  Headaches.  Problems sleeping (insomnia).  Mood swings or irritability.  Memory problems.  Weight gain.  Hair growth on the face and chest.  Bladder infections or problems with urinating. How  is this diagnosed? This condition is diagnosed based on your medical history, a physical exam, your age, your menstrual history, and your symptoms. Hormone tests may also be done. How is this treated? In some cases, no treatment is needed. You and your health care provider should make a decision together about whether treatment is necessary. Treatment will be based on your individual condition and preferences. Treatment for this condition focuses on managing symptoms. Treatment may include:  Menopausal hormone therapy (MHT).  Medicines to treat specific symptoms or complications.  Acupuncture.  Vitamin or herbal supplements. Before starting treatment, make sure to let your health care provider know if you have a personal or family history of:  Heart disease.  Breast cancer.  Blood clots.  Diabetes.  Osteoporosis. Follow these instructions at home: Lifestyle  Do not use any products that contain nicotine or tobacco, such as cigarettes and e-cigarettes. If you need help quitting, ask your health care provider.  Get at least 30 minutes of physical activity on 5 or more days each week.  Avoid alcoholic and caffeinated beverages, as well as spicy foods. This may help prevent hot flashes.  Get 7-8 hours of sleep each night.  If you have hot flashes, try: ? Dressing in layers. ? Avoiding things that may trigger hot flashes, such as spicy food, warm places, or stress. ? Taking slow, deep breaths when a hot flash starts. ? Keeping a fan in your home and office.  Find ways to manage stress, such as deep breathing, meditation, or journaling.  Consider going to group therapy with other women who are having menopause symptoms. Ask your health care provider about recommended group therapy meetings. Eating and   drinking  Eat a healthy, balanced diet that contains whole grains, lean protein, low-fat dairy, and plenty of fruits and vegetables.  Your health care provider may recommend  adding more soy to your diet. Foods that contain soy include tofu, tempeh, and soy milk.  Eat plenty of foods that contain calcium and vitamin D for bone health. Items that are rich in calcium include low-fat milk, yogurt, beans, almonds, sardines, broccoli, and kale. Medicines  Take over-the-counter and prescription medicines only as told by your health care provider.  Talk with your health care provider before starting any herbal supplements. If prescribed, take vitamins and supplements as told by your health care provider. These may include: ? Calcium. Women age 51 and older should get 1,200 mg (milligrams) of calcium every day. ? Vitamin D. Women need 600-800 International Units of vitamin D each day. ? Vitamins B12 and B6. Aim for 50 micrograms of B12 and 1.5 mg of B6 each day. General instructions  Keep track of your menstrual periods, including: ? When they occur. ? How heavy they are and how long they last. ? How much time passes between periods.  Keep track of your symptoms, noting when they start, how often you have them, and how long they last.  Use vaginal lubricants or moisturizers to help with vaginal dryness and improve comfort during sex.  Keep all follow-up visits as told by your health care provider. This is important. This includes any group therapy or counseling. Contact a health care provider if:  You are still having menstrual periods after age 55.  You have pain during sex.  You have not had a period for 12 months and you develop vaginal bleeding. Get help right away if:  You have: ? Severe depression. ? Excessive vaginal bleeding. ? Pain when you urinate. ? A fast or irregular heart beat (palpitations). ? Severe headaches. ? Abdomen (abdominal) pain or severe indigestion.  You fell and you think you have a broken bone.  You develop leg or chest pain.  You develop vision problems.  You feel a lump in your breast. Summary  Menopause is the normal  time of life when menstrual periods stop completely. It is usually confirmed by 12 months without a menstrual period.  The transition to menopause (perimenopause) most often happens between the ages of 45 and 55.  Symptoms can be managed through medicines, lifestyle changes, and complementary therapies such as acupuncture.  Eat a balanced diet that is rich in nutrients to promote bone health and heart health and to manage symptoms during menopause. This information is not intended to replace advice given to you by your health care provider. Make sure you discuss any questions you have with your health care provider. Document Revised: 10/28/2017 Document Reviewed: 12/18/2016 Elsevier Patient Education  2020 Elsevier Inc.  

## 2020-05-26 NOTE — Progress Notes (Signed)
Subjective:     Cathy Simmons is a 54 y.o. female here for a routine exam.  LMP > 1 year prev Current complaints: pt has had freq UTIs. Her UA has showed calcium and she has had back pain. Her primary care provider referred her to Urology. She has a appt set up. She denies vagina itching or pain.     Pt has a maternal h/o bilateral mastectomies due to breast cancer. Died at age 49 years from a CVA.    Gynecologic History No LMP recorded. Patient is perimenopausal. Contraception: post menopausal status Last Pap: 2018. Results were: normal. She has a h/o abnormal PAPs but does not know when. They were repeated and were normal.     Last mammogram: 11/05/2019 with [place] 11/13/2020 birad 2.   Obstetric History OB History  Gravida Para Term Preterm AB Living  6 3 3   3 2   SAB TAB Ectopic Multiple Live Births  3       2    # Outcome Date GA Lbr Len/2nd Weight Sex Delivery Anes PTL Lv  6 Term 34 [redacted]w[redacted]d   F Vag-Spont None N LIV  5 SAB 1993          4 Term 45 [redacted]w[redacted]d   M Vag-Spont None N   3 SAB 1990          2 Term 23 [redacted]w[redacted]d   F Vag-Spont None N LIV  1 SAB 1984            The following portions of the patient's history were reviewed and updated as appropriate: allergies, current medications, past family history, past medical history, past social history, past surgical history and problem list.  Review of Systems Pertinent items are noted in HPI.    Objective:  BP (!) 99/58   Pulse 74   Ht 5\' 2"  (1.575 m)   Wt 148 lb (67.1 kg)   BMI 27.07 kg/m      General Appearance:    Alert, cooperative, no distress, appears stated age  Head:    Normocephalic, without obvious abnormality, atraumatic  Eyes:    conjunctiva/corneas clear, EOM's intact, both eyes  Ears:    Normal external ear canals, both ears  Nose:   Nares normal, septum midline, mucosa normal, no drainage    or sinus tenderness  Throat:   Lips, mucosa, and tongue normal; teeth and gums normal  Neck:   Supple, symmetrical,  trachea midline, no adenopathy;    thyroid:  no enlargement/tenderness/nodules  Back:     Symmetric, no curvature, ROM normal, no CVA tenderness  Lungs:     respirations unlabored  Chest Wall:    No tenderness or deformity   Heart:    Regular rate and rhythm  Breast Exam:    No tenderness, masses, or nipple abnormality  Abdomen:     Soft, non-tender, bowel sounds active all four quadrants,    no masses, no organomegaly  Genitalia:    Normal female without lesion, discharge or tenderness   The cervical os is stenotic. The tissue is well estrogenized.   Extremities:   Extremities normal, atraumatic, no cyanosis or edema  Pulses:   2+ and symmetric all extremities  Skin:   Skin color, texture, turgor normal, no rashes or lesions    Assessment:    Healthy female exam.   Freq UTIs scheduled to see urology to eval for nephrolithiasis.  Has not had colonoscopy. Has referral making appt today (awating records from  prev study).   Family h/o breast cancer- 1st degree relative.     Plan:   [name] was seen today for gynecologic exam.  Diagnoses and all orders for this visit:  Well female exam with routine gynecological exam -     Cytology - PAP( [place])  Family history of breast cancer -     MM DIGITAL SCREENING BILATERAL; Future  f/u in 1 year or sooner prn   Ravinder Lukehart L. Harraway-Smith, M.D., [place]

## 2020-05-27 LAB — CYTOLOGY - PAP
Comment: NEGATIVE
Diagnosis: NEGATIVE
High risk HPV: NEGATIVE

## 2020-06-06 ENCOUNTER — Encounter: Payer: Self-pay | Admitting: Family Medicine

## 2020-06-12 ENCOUNTER — Telehealth: Payer: Self-pay | Admitting: Gastroenterology

## 2020-06-12 NOTE — Telephone Encounter (Signed)
Dr. Chales Abrahams reviewed records and said it was okay to schedule OV.  LM w/husband to call back to schedule.  Records at [facility] office

## 2020-06-30 DIAGNOSIS — R3121 Asymptomatic microscopic hematuria: Secondary | ICD-10-CM | POA: Diagnosis not present

## 2020-07-11 DIAGNOSIS — R3121 Asymptomatic microscopic hematuria: Secondary | ICD-10-CM | POA: Diagnosis not present

## 2020-07-11 DIAGNOSIS — N200 Calculus of kidney: Secondary | ICD-10-CM | POA: Diagnosis not present

## 2020-11-13 ENCOUNTER — Emergency Department (HOSPITAL_COMMUNITY): Payer: BC Managed Care – PPO

## 2020-11-13 ENCOUNTER — Other Ambulatory Visit: Payer: Self-pay

## 2020-11-13 ENCOUNTER — Encounter (HOSPITAL_COMMUNITY): Payer: Self-pay

## 2020-11-13 DIAGNOSIS — S8391XA Sprain of unspecified site of right knee, initial encounter: Secondary | ICD-10-CM | POA: Diagnosis not present

## 2020-11-13 DIAGNOSIS — Y990 Civilian activity done for income or pay: Secondary | ICD-10-CM | POA: Diagnosis not present

## 2020-11-13 DIAGNOSIS — W01198A Fall on same level from slipping, tripping and stumbling with subsequent striking against other object, initial encounter: Secondary | ICD-10-CM | POA: Diagnosis not present

## 2020-11-13 DIAGNOSIS — Z79899 Other long term (current) drug therapy: Secondary | ICD-10-CM | POA: Diagnosis not present

## 2020-11-13 DIAGNOSIS — S8991XA Unspecified injury of right lower leg, initial encounter: Secondary | ICD-10-CM | POA: Diagnosis not present

## 2020-11-13 DIAGNOSIS — Z7901 Long term (current) use of anticoagulants: Secondary | ICD-10-CM | POA: Insufficient documentation

## 2020-11-13 DIAGNOSIS — M25561 Pain in right knee: Secondary | ICD-10-CM | POA: Diagnosis not present

## 2020-11-13 DIAGNOSIS — Z87891 Personal history of nicotine dependence: Secondary | ICD-10-CM | POA: Insufficient documentation

## 2020-11-13 NOTE — ED Triage Notes (Signed)
Pt lost her balance at work and fell; complains of right knee pain.

## 2020-11-14 ENCOUNTER — Emergency Department (HOSPITAL_COMMUNITY)
Admission: EM | Admit: 2020-11-14 | Discharge: 2020-11-14 | Disposition: A | Discharge status: Home / Self Care | Payer: BC Managed Care – PPO | Attending: Emergency Medicine | Admitting: Emergency Medicine

## 2020-11-14 DIAGNOSIS — M25461 Effusion, right knee: Secondary | ICD-10-CM

## 2020-11-14 DIAGNOSIS — S8391XA Sprain of unspecified site of right knee, initial encounter: Secondary | ICD-10-CM

## 2020-11-14 MED ORDER — HYDROCODONE-ACETAMINOPHEN 5-325 MG PO TABS
1.0000 | ORAL_TABLET | Freq: Once | ORAL | Status: AC
  Administered 2020-11-14: 1 via ORAL
  Filled 2020-11-14: qty 1

## 2020-11-14 MED ORDER — HYDROCODONE-ACETAMINOPHEN 5-325 MG PO TABS: 1.0000 | ORAL_TABLET | ORAL | 0 refills | Status: DC | PRN

## 2020-11-14 NOTE — Discharge Instructions (Signed)
Apply ice for 30 minutes at a time, 4 times a day.  Wear the immobilizer as needed.  Use crutches as needed.  Follow-up with the orthopedic doctor, call today for an appointment next week.

## 2020-11-14 NOTE — ED Provider Notes (Signed)
[facility] EMERGENCY DEPARTMENT Provider Note   CSN: [id number] Arrival date & time: 11/13/20  2231   History Chief Complaint  Patient presents with  . Fall    Cathy Simmons is a 54 y.o. female.  The history is provided by the patient.  Fall  She has history of hyperlipidemia, cerebral venous sinus thrombosis anticoagulated on rivaroxaban and comes in after having a fall at work.  She landed on her knees and also hit her left arm on a chair.  She is complaining of pain in her right knee.  She denies any head injury.  She rates her pain at 9/10.  Past Medical History:  Diagnosis Date  . Anxiety   . Asplenia   . Depression   . Family history of anesthesia complication    mother difficult to wake  . GERD (gastroesophageal reflux disease)   . History of blood clots   . Hyperlipidemia     Patient Active Problem List   Diagnosis Date Noted  . Family history of breast cancer 05/26/2020  . GAD (generalized anxiety disorder) 03/28/2020  . Depression, major, single episode, moderate (HCC) 03/28/2020  . Bilateral plantar fasciitis 09/03/2019  . OAB (overactive bladder) 09/03/2019  . Cerebral venous sinus thrombosis 01/24/2019  . Dislocation of jaw 05/15/2018  . TMJ syndrome 05/15/2018  . Asplenia   . Anxiety and depression     Past Surgical History:  Procedure Laterality Date  . BREAST BIOPSY Right    needle core biopsy, benign  . BREAST EXCISIONAL BIOPSY Left    2 times left breast, benign both times  . BREAST SURGERY  breat biopsy x2 last 1995  . DILATION AND CURETTAGE OF UTERUS  x2 1984 and 1991  . excision left ganglion cyst Left   . FOREIGN BODY REMOVAL Right 01/29/2013   Procedure: EXCISION FOREIGN BODY RIGHT SMALL FINGER;  Surgeon: Nicki Reaper, MD;  Location: [facility];  Service: Orthopedics;  Laterality: Right;  . removal spleen  2000     OB History    Gravida  6   Para  3   Term  3   Preterm      AB  3   Living  2     SAB  3    IAB      Ectopic      Multiple      Live Births  2           Family History  Problem Relation Age of Onset  . Stroke Mother   . Cancer Mother        breast cancer  . Diabetes Father   . Parkinson's disease Father     Social History   Tobacco Use  . Smoking status: Former Smoker    Packs/day: 0.50    Quit date: 01/30/1999    Years since quitting: 21.8  . Smokeless tobacco: Never Used  Substance Use Topics  . Alcohol use: No  . Drug use: No    Home Medications Prior to Admission medications   Medication Sig Start Date End Date Taking? Authorizing Provider  albuterol (PROAIR HFA) 108 (90 Base) MCG/ACT inhaler Inhale 2 puffs into the lungs every 6 (six) hours as needed for wheezing or shortness of breath.    [provider]  amitriptyline (ELAVIL) 25 MG tablet Take 1 tablet (25 mg total) by mouth at bedtime. 03/28/20   Sharlene Dory, DO  cetirizine (ZYRTEC) 10 MG tablet Take 10 mg  by mouth daily.    [provider]  Cholecalciferol (VITAMIN D-3) 1000 units CAPS Take 1,000 Units by mouth daily.     [provider]  clonazePAM (KLONOPIN) 0.5 MG tablet Take 1 tablet (0.5 mg total) by mouth 2 (two) times daily as needed. for anxiety 12/14/19   Sharlene Dory, DO  esomeprazole (NEXIUM) 20 MG capsule Take 20 mg by mouth daily at 12 noon.    [provider]  fluticasone (FLONASE) 50 MCG/ACT nasal spray Place 2 sprays into both nostrils daily. 04/01/20   Sharlene Dory, DO  HYDROcodone-acetaminophen (NORCO) 5-325 MG tablet Take 1 tablet by mouth every 4 (four) hours as needed for moderate pain. 11/14/20   Dione Booze, MD  HYDROcodone-acetaminophen (NORCO) 5-325 MG tablet Take 1 tablet by mouth every 4 (four) hours as needed for moderate pain. 11/14/20   Dione Booze, MD  levothyroxine (SYNTHROID, LEVOTHROID) 25 MCG tablet Take 1 tablet (25 mcg total) by mouth daily before breakfast. Patient not taking: Reported on  05/26/2020 06/21/18   Sharlene Dory, DO  rivaroxaban (XARELTO) 20 MG TABS tablet Take 1 tablet (20 mg total) by mouth daily with supper. 05/23/20   Sharlene Dory, DO  rosuvastatin (CRESTOR) 20 MG tablet Take 1 tablet (20 mg total) by mouth daily. 04/01/20   Sharlene Dory, DO  vitamin B-12 (CYANOCOBALAMIN) 1000 MCG tablet Take 500-1,000 mcg by mouth daily.     [provider]    Allergies    Bactrim [sulfamethoxazole-trimethoprim], Adhesive [tape], and Cymbalta [duloxetine hcl]  Review of Systems   Review of Systems  All other systems reviewed and are negative.   Physical Exam Updated Vital Signs BP 112/63 (BP Location: Left Arm)   Pulse 79   Temp 98.3 F (36.8 C) (Oral)   Resp 16   Ht 5\' 2"  (1.575 m)   Wt 66.7 kg   SpO2 97%   BMI 26.89 kg/m   Physical Exam Vitals and nursing note reviewed.   54 year old female, resting comfortably and in no acute distress. Vital signs are normal. Oxygen saturation is 97%, which is normal. Head is normocephalic and atraumatic. PERRLA, EOMI. Oropharynx is clear. Neck is nontender and supple without adenopathy or JVD. Back is nontender and there is no CVA tenderness. Lungs are clear without rales, wheezes, or rhonchi. Chest is nontender. Heart has regular rate and rhythm without murmur. Abdomen is soft, flat, nontender without masses or hepatosplenomegaly and peristalsis is normoactive. Extremities: There is a moderate to large effusion present on the right knee.  Right knee is tender rather diffusely.  There is no instability to valgus or varus stress.  Lachman test is negative, McMurray's test is suboptimal due to pain with passive range of motion.  Full range of motion of all other joints without pain. Skin is warm and dry without rash. Neurologic: Mental status is normal, cranial nerves are intact, there are no motor or sensory deficits.  ED Results / Procedures / Treatments    Radiology DG Knee Complete  4 Views Right  Result Date: 11/13/2020 CLINICAL DATA:  Fall, right knee pain EXAM: RIGHT KNEE - COMPLETE 4+ VIEW COMPARISON:  None. FINDINGS: No evidence of fracture, dislocation, or joint effusion. No evidence of arthropathy or other focal bone abnormality. Soft tissues are unremarkable. IMPRESSION: Negative. Electronically Signed   By: 11/15/2020 MD   On: 11/13/2020 23:35    Procedures Procedures  Medications Ordered in ED Medications  HYDROcodone-acetaminophen (NORCO/VICODIN) 5-325 MG  per tablet 1 tablet (has no administration in time range)    ED Course  I have reviewed the triage vital signs and the nursing notes.  Pertinent imaging results that were available during my care of the patient were reviewed by me and considered in my medical decision making (see chart for details).  MDM Rules/Calculators/A&P Injury to right knee.  X-rays are negative for fracture.  Mechanism is not suggestive of ligament or meniscus injury.  Suspect hemarthrosis secondary to anticoagulated state.  She is placed in a knee immobilizer and given crutches and discharged with prescription for hydrocodone-acetaminophen for pain.  Referred to orthopedics for follow-up.  Final Clinical Impression(s) / ED Diagnoses Final diagnoses:  Sprain of right knee, unspecified ligament, initial encounter  Effusion of right knee joint    Rx / DC Orders ED Discharge Orders         Ordered    HYDROcodone-acetaminophen (NORCO) 5-325 MG tablet  Every 4 hours PRN        11/14/20 0436    HYDROcodone-acetaminophen (NORCO) 5-325 MG tablet  Every 4 hours PRN        11/14/20 0436           Dione Booze, MD 11/14/20 [phone]

## 2020-11-17 ENCOUNTER — Ambulatory Visit: Payer: BC Managed Care – PPO | Admitting: Family Medicine

## 2020-11-17 ENCOUNTER — Other Ambulatory Visit: Payer: Self-pay

## 2020-11-17 ENCOUNTER — Encounter: Payer: Self-pay | Admitting: Family Medicine

## 2020-11-17 VITALS — BP 118/72 | HR 94 | Temp 98.7°F | Ht 62.0 in | Wt 145.2 lb

## 2020-11-17 DIAGNOSIS — E7800 Pure hypercholesterolemia, unspecified: Secondary | ICD-10-CM | POA: Diagnosis not present

## 2020-11-17 DIAGNOSIS — Z86718 Personal history of other venous thrombosis and embolism: Secondary | ICD-10-CM | POA: Insufficient documentation

## 2020-11-17 LAB — LIPID PANEL
Cholesterol: 150 mg/dL (ref 0–200)
HDL: 55.1 mg/dL (ref >39.00)
LDL Cholesterol: 78 mg/dL (ref 0–99)
NonHDL: 94.92
Total CHOL/HDL Ratio: 3
Triglycerides: 86 mg/dL (ref 0.0–149.0)
VLDL: 17.2 mg/dL (ref 0.0–40.0)

## 2020-11-17 LAB — COMPREHENSIVE METABOLIC PANEL
ALT: 20 U/L (ref 0–35)
AST: 19 U/L (ref 0–37)
Albumin: 4.2 g/dL (ref 3.5–5.2)
Alkaline Phosphatase: 97 U/L (ref 39–117)
BUN: 13 mg/dL (ref 6–23)
CO2: 29 mEq/L (ref 19–32)
Calcium: 10 mg/dL (ref 8.4–10.5)
Chloride: 106 mEq/L (ref 96–112)
Creatinine, Ser: 0.65 mg/dL (ref 0.40–1.20)
GFR: 98.96 mL/min (ref >60.00)
Glucose, Bld: 84 mg/dL (ref 70–99)
Potassium: 4.3 mEq/L (ref 3.5–5.1)
Sodium: 141 mEq/L (ref 135–145)
Total Bilirubin: 0.5 mg/dL (ref 0.2–1.2)
Total Protein: 7.2 g/dL (ref 6.0–8.3)

## 2020-11-17 MED FILL — Hydrocodone-Acetaminophen Tab 5-325 MG: ORAL | Qty: 6 | Status: AC

## 2020-11-17 NOTE — Patient Instructions (Signed)
Keep the diet clean and stay active.  Give us 2-3 business days to get the results of your labs back.   Let us know if you need anything.  

## 2020-11-17 NOTE — Progress Notes (Signed)
CC: f/u  Subjective: Hyperlipidemia Patient presents for Hyperlipidemia follow up. Currently taking Crestor 20 mg/d and compliance with treatment thus far has been good. She denies myalgias. She is usually adhering to a healthy diet. Exercise: was doing cardio and some wt resistance; cycling and walking lately The patient is not known to have coexisting coronary artery disease. She denies chest pain or shortness of breath.  History of blood clots in brain x 2 2016 and 2017. Lifelong anticoag. Taking Xarelto 20 mg/d. Largely compliant. No AE's. Denies bleeding.   Past Medical History:  Diagnosis Date  . Anxiety   . Asplenia   . Depression   . Family history of anesthesia complication    mother difficult to wake  . GERD (gastroesophageal reflux disease)   . History of blood clots   . Hyperlipidemia     Objective: BP 118/72 (BP Location: Left Arm, Patient Position: Sitting, Cuff Size: Normal)   Pulse 94   Temp 98.7 F (37.1 C) (Oral)   Ht 5\' 2"  (1.575 m)   Wt 145 lb 4 oz (65.9 kg)   SpO2 96%   BMI 26.57 kg/m  General: Awake, appears stated age Heart: RRR, no LE edema, no bruits Lungs: CTAB, no rales, wheezes or rhonchi. No accessory muscle use Psych: Age appropriate judgment and insight, normal affect and mood  Assessment and Plan: Pure hypercholesterolemia - Plan: Lipid panel, Comprehensive metabolic panel  History of cerebral venous sinus thrombosis  1.  Continue Crestor 20 mg daily.  Counseled on diet and exercise. 2.  Continue lifelong anticoagulation with Xarelto 20 mg daily. F/u 6 months for physical or as needed. The patient voiced understanding and agreement to the plan.  [place], DO 11/17/20  9:26 AM

## 2020-11-18 DIAGNOSIS — M25561 Pain in right knee: Secondary | ICD-10-CM | POA: Diagnosis not present

## 2020-12-01 ENCOUNTER — Encounter: Payer: Self-pay | Admitting: Physical Therapy

## 2020-12-01 ENCOUNTER — Other Ambulatory Visit: Payer: Self-pay

## 2020-12-01 ENCOUNTER — Ambulatory Visit: Payer: BC Managed Care – PPO | Attending: Student | Admitting: Physical Therapy

## 2020-12-01 DIAGNOSIS — M25561 Pain in right knee: Secondary | ICD-10-CM | POA: Diagnosis not present

## 2020-12-01 DIAGNOSIS — R600 Localized edema: Secondary | ICD-10-CM | POA: Diagnosis not present

## 2020-12-01 DIAGNOSIS — R262 Difficulty in walking, not elsewhere classified: Secondary | ICD-10-CM | POA: Diagnosis not present

## 2020-12-01 DIAGNOSIS — M6281 Muscle weakness (generalized): Secondary | ICD-10-CM

## 2020-12-01 NOTE — Therapy (Signed)
[facility] [address] [place], [place], [address] Phone: [phone]   Fax:  [phone]  Physical Therapy Evaluation  Patient Details  Name: Cathy Simmons MRN: [id number] Date of Birth: 1966/08/26 Referring Provider (PT): Ulyses Southward, PA-C   Encounter Date: 12/01/2020   PT End of Session - 12/01/20 1143    Visit Number 1    Number of Visits 12    Date for PT Re-Evaluation 01/19/21    Authorization Type [employer], Progress note every 10th visit    PT Start Time 0945    PT Stop Time 1030    PT Time Calculation (min) 45 min    Activity Tolerance Patient limited by pain    Behavior During Therapy [facility] for tasks assessed/performed           Past Medical History:  Diagnosis Date  . Anxiety   . Asplenia   . Depression   . Family history of anesthesia complication    mother difficult to wake  . GERD (gastroesophageal reflux disease)   . History of blood clots   . Hyperlipidemia     Past Surgical History:  Procedure Laterality Date  . BREAST BIOPSY Right    needle core biopsy, benign  . BREAST EXCISIONAL BIOPSY Left    2 times left breast, benign both times  . BREAST SURGERY  breat biopsy x2 last 1995  . DILATION AND CURETTAGE OF UTERUS  x2 1984 and 1991  . excision left ganglion cyst Left   . FOREIGN BODY REMOVAL Right 01/29/2013   Procedure: EXCISION FOREIGN BODY RIGHT SMALL FINGER;  Surgeon: Nicki Reaper, MD;  Location: [facility];  Service: Orthopedics;  Laterality: Right;  . removal spleen  2000    There were no vitals filed for this visit.    Subjective Assessment - 12/01/20 1139    Subjective COVID-19 screening performed upon arrival. Patient arrives to pyhsical therapy with right knee pain, difficulty performing ADLs and home activities, and difficulty walking secondary to a fall on her right knee on 11/13/2020. Patient reported falling on both of her knees in her work break room. Patient gains  assistance from her husband for home activities, getting in/out of the bath, and up her 2 steps to enter/exit her home. Patient reports pain at worst as 8/10 with icing, standing and walking, and pain at best as 1-2/10 with elevation and rest. Patient would benefit from skilled physical therapy to address deficits and patient's goals.    Pertinent History anxiety, depression, GERD, history of cerebral venous sinus thrombosis, tape allergy    Limitations Standing;Walking;House hold activities    How long can you sit comfortably? anxiety, depression, GERD, history of cerebral venous sinus thrombosis    How long can you stand comfortably? 3 hrs    How long can you walk comfortably? short periods but requires assistance from cart    Diagnostic tests x-ray : (-) for fractures    Patient Stated Goals return to work and get back to walking program    Currently in Pain? Yes    Pain Score 6     Pain Location Knee    Pain Orientation Right    Pain Descriptors / Indicators Numbness;Sharp;Aching;Burning    Pain Type Acute pain    Pain Onset 1 to 4 weeks ago    Pain Frequency Constant    Aggravating Factors  "standing/walking for long periods of time"    Pain Relieving Factors "elevation and rest (sometimes) sometimes  it aches/burns even elevated and resting"    Effect of Pain on Daily Activities "can't work, problems sleeping or doing daily activities, hurts to physically touch or clothes to touch"              [facility] PT Assessment - 12/01/20 0001      Assessment   Medical Diagnosis R knee pain    Referring Provider (PT) Patrecia Pace, PA-C    Onset Date/Surgical Date 11/13/20    Next MD Visit 12/23/2020    Prior Therapy no      Precautions   Precautions None      Restrictions   Weight Bearing Restrictions No      Balance Screen   Has the patient fallen in the past 6 months Yes    How many times? 1    Has the patient had a decrease in activity level because of a fear of falling?  Yes     Is the patient reluctant to leave their home because of a fear of falling?  Yes      [place] Private residence    Living Arrangements Spouse/significant other    Type of [place] to enter    Entrance Stairs-Number of Steps 2    Entrance Stairs-Rails None      Prior Function   Level of Independence Needs assistance with transfers      Observation/Other Assessments-Edema    Edema Circumferential      Circumferential Edema   Circumferential - Right 36 cm at mid patella    Circumferential - Left  35.4 cm at mid patella      ROM / Strength   AROM / PROM / Strength AROM;PROM;Strength      AROM   Overall AROM  Due to pain;Within functional limits for tasks performed    AROM Assessment Site Knee    Right/Left Knee Right    Right Knee Extension 5    Right Knee Flexion 128      PROM   Overall PROM  Due to pain    PROM Assessment Site Knee    Right/Left Knee Right    Right Knee Extension 0    Right Knee Flexion 134      Strength   Overall Strength Deficits;Due to pain    Overall Strength Comments grossly assessed secondary to pain 3-/5 into knee flexion and extension      Palpation   Patella mobility decreased suoerior and inferior joint mobs    Palpation comment very tender to palpation to R knee bilateral joint lines and on tibial tuberosity      Special Tests    Special Tests Knee Special Tests;Laxity/Instability Tests;Meniscus Tests    Other special tests (-) right valgus and varus stress test    Laxity/Instability  Anterior drawer test;Posterior drawer test    Knee Special tests  Patellofemoral Apprehension Test    Meniscus Tests Apley's Test      Anterior drawer test   Findings Negative    Side Right      Posterior drawer test   Findings Negative    Side  Right      Patellofemoral Apprehension Test    Findings Positive    Side  Right      Apley's Test   Findings Positive    Side Right    Comments  reproduction of R knee pain      Transfers   Transfers  Independent with all Transfers      Ambulation/Gait   Assistive device None    Gait Pattern Step-to pattern;Step-through pattern;Decreased step length - right;Decreased step length - left;Decreased stance time - right;Decreased stride length;Decreased weight shift to right;Decreased hip/knee flexion - right;Right flexed knee in stance;Antalgic      Standardized Balance Assessment   Standardized Balance Assessment Five Times Sit to Stand    Five times sit to stand comments  16.0 seconds with UE support                      Objective measurements completed on examination: See above findings.       [facility] Adult PT Treatment/Exercise - 12/01/20 0001      Modalities   Modalities Electrical Stimulation      Electrical Stimulation   Electrical Stimulation Location R knee joint lines    Electrical Stimulation Action pre-mod    Electrical Stimulation Parameters 80-150 hz x10 mins    Electrical Stimulation Goals Pain                  PT Education - 12/01/20 1143    Education Details quad sets, hell slides, clam shells without band, pillow squeeze    Person(s) Educated Patient    Methods Explanation;Demonstration;Handout    Comprehension Verbalized understanding;Returned demonstration               PT Long Term Goals - 12/01/20 1158      PT LONG TERM GOAL #1   Title Patient will be independent with HEP and its progression.    Time 6    Period Weeks    Status New      PT LONG TERM GOAL #2   Title Patient will demonstrate 3 degrees or less of right knee extension AROM to improve gait mechanics.    Time 6    Period Weeks    Status New      PT LONG TERM GOAL #3   Title Patient will ambulate a community distance with no AD and minimal gait deviations and right knee pain less than or equal to 4/10 to access areas of home and return to community ambulation without support.    Time 6    Period Weeks     Status New      PT LONG TERM GOAL #4   Title Patient will demonstrate 4/5 or greater right knee MMT in all planes to improve stability during functional tasks.    Time 6    Period Weeks    Status New      PT LONG TERM GOAL #5   Title Patient will negotiate steps with a reciprocal pattern and no railing to safely enter and exit home.    Time 6    Period Weeks    Status New                  Plan - 12/01/20 1145    Clinical Impression Statement Patient is a 55 year old female who presents to physical therapy with right knee pain, increased localized edema and pain with end ranges of motion that began secondary to a fall on 11/13/2020. Patient very tender to palpation to R anterior knee, along joint lines and tibial tuberoisty; reports of diminished sensation to lateral aspect of the knee as well. Patient is very hesitant with ROM and sit with knee slightly extended secondary to pain. Patient ambulates with an antalgic gait patten with no AD  and decreased right stance time, decreased right knee flexion during swing, and decreased right knee extension during stance. Patient and PT discussed POC and HEP to which patient reported understanding. Patient would benefit for skilled physical therapy to address deficits and patient's goals.    Personal Factors and Comorbidities Comorbidity 3+;Time since onset of injury/illness/exacerbation    Comorbidities anxiety, depression, GERD, history of cerebral venous sinus thrombosis    Examination-Activity Limitations Bathing;Locomotion Level;Transfers;Hygiene/Grooming;Dressing;Stairs;Sleep    Examination-Participation Restrictions Occupation;Meal Prep;Cleaning;Laundry    Stability/Clinical Decision Making Stable/Uncomplicated    Clinical Decision Making Low    Rehab Potential Fair    PT Frequency 2x / week    PT Duration 6 weeks    PT Treatment/Interventions ADLs/Self Care Home Management;Cryotherapy;Electrical Stimulation;Iontophoresis 4mg /ml  Dexamethasone;Moist Heat;Ultrasound;Neuromuscular re-education;Balance training;Therapeutic exercise;Therapeutic activities;Functional mobility training;Gait training;Passive range of motion;Manual techniques;Vasopneumatic Device    PT Next Visit Plan FOTO, nustep, pain free strengthening and ROM,  modalities prn for pain relief    PT Home Exercise Plan see patient education section    Consulted and Agree with Plan of Care Patient           Patient will benefit from skilled therapeutic intervention in order to improve the following deficits and impairments:  Abnormal gait,Difficulty walking,Decreased range of motion,Decreased activity tolerance,Decreased balance,Decreased strength,Impaired sensation,Pain  Visit Diagnosis: Acute pain of right knee - Plan: PT plan of care cert/re-cert  Difficulty in walking, not elsewhere classified - Plan: PT plan of care cert/re-cert  Localized edema - Plan: PT plan of care cert/re-cert  Muscle weakness (generalized) - Plan: PT plan of care cert/re-cert     Problem List Patient Active Problem List   Diagnosis Date Noted  . History of cerebral venous sinus thrombosis 11/17/2020  . Pure hypercholesterolemia 11/17/2020  . Family history of breast cancer 05/26/2020  . GAD (generalized anxiety disorder) 03/28/2020  . Depression, major, single episode, moderate (HCC) 03/28/2020  . Bilateral plantar fasciitis 09/03/2019  . OAB (overactive bladder) 09/03/2019  . Cerebral venous sinus thrombosis 01/24/2019  . Dislocation of jaw 05/15/2018  . TMJ syndrome 05/15/2018  . Asplenia   . Anxiety and depression     05/17/2018, PT, DPT 12/01/2020, 12:12 PM  [facility] [address] [place], [place], [place] Phone: [phone]   Fax:  [phone]  Name: JAYLIANNA TATLOCK MRN: Denton Meek Date of Birth: 12-21-1965

## 2020-12-04 ENCOUNTER — Ambulatory Visit: Payer: BC Managed Care – PPO | Admitting: Physical Therapy

## 2020-12-08 ENCOUNTER — Other Ambulatory Visit: Payer: Self-pay

## 2020-12-08 ENCOUNTER — Encounter: Payer: Self-pay | Admitting: Physical Therapy

## 2020-12-08 ENCOUNTER — Ambulatory Visit: Payer: BC Managed Care – PPO | Admitting: Physical Therapy

## 2020-12-08 DIAGNOSIS — R262 Difficulty in walking, not elsewhere classified: Secondary | ICD-10-CM

## 2020-12-08 DIAGNOSIS — M6281 Muscle weakness (generalized): Secondary | ICD-10-CM | POA: Diagnosis not present

## 2020-12-08 DIAGNOSIS — M25561 Pain in right knee: Secondary | ICD-10-CM | POA: Diagnosis not present

## 2020-12-08 DIAGNOSIS — R600 Localized edema: Secondary | ICD-10-CM | POA: Diagnosis not present

## 2020-12-08 NOTE — Therapy (Signed)
[facility] [address] [place], [place], [address] Phone: [phone]   Fax:  [phone]  Physical Therapy Treatment  Patient Details  Name: Cathy Simmons MRN: [id number] Date of Birth: February 03, 1966 Referring Provider (PT): Ulyses Southward, PA-C   Encounter Date: 12/08/2020   PT End of Session - 12/08/20 0944    Visit Number 2    Number of Visits 12    Date for PT Re-Evaluation 01/19/21    Authorization Type BCBS, Progress note every 10th visit    PT Start Time 0948    PT Stop Time 1029    PT Time Calculation (min) 41 min    Activity Tolerance Patient limited by pain    Behavior During Therapy [facility] for tasks assessed/performed           Past Medical History:  Diagnosis Date  . Anxiety   . Asplenia   . Depression   . Family history of anesthesia complication    mother difficult to wake  . GERD (gastroesophageal reflux disease)   . History of blood clots   . Hyperlipidemia     Past Surgical History:  Procedure Laterality Date  . BREAST BIOPSY Right    needle core biopsy, benign  . BREAST EXCISIONAL BIOPSY Left    2 times left breast, benign both times  . BREAST SURGERY  breat biopsy x2 last 1995  . DILATION AND CURETTAGE OF UTERUS  x2 1984 and 1991  . excision left ganglion cyst Left   . FOREIGN BODY REMOVAL Right 01/29/2013   Procedure: EXCISION FOREIGN BODY RIGHT SMALL FINGER;  Surgeon: Nicki Reaper, MD;  Location: [facility];  Service: Orthopedics;  Laterality: Right;  . removal spleen  2000    There were no vitals filed for this visit.   Subjective Assessment - 12/08/20 0943    Subjective COVID 19 screening performed on patient upon arrival. Patient reports laying in SL the other night with knees flexed. Patient reports that her R knee locked and the only way she could straighten it was by forcing knee extension. Patient states that when she did get her knee into extension that her knee popped loudly.     Pertinent History anxiety, depression, GERD, history of cerebral venous sinus thrombosis, tape allergy    Limitations Standing;Walking;House hold activities    How long can you sit comfortably? anxiety, depression, GERD, history of cerebral venous sinus thrombosis    How long can you stand comfortably? 3 hrs    How long can you walk comfortably? short periods but requires assistance from cart    Diagnostic tests x-ray : (-) for fractures    Patient Stated Goals return to work and get back to walking program    Currently in Pain? Yes    Pain Score --   No pain score provided   Pain Location Knee    Pain Orientation Right    Pain Descriptors / Indicators Discomfort;Numbness    Pain Type Acute pain    Pain Onset 1 to 4 weeks ago    Pain Frequency Constant              [facility] PT Assessment - 12/08/20 0001      Assessment   Medical Diagnosis R knee pain    Referring Provider (PT) Ulyses Southward, PA-C    Onset Date/Surgical Date 11/13/20    Next MD Visit 12/23/2020    Prior Therapy no      Precautions  Precautions None      Restrictions   Weight Bearing Restrictions No                         [facility] Adult PT Treatment/Exercise - 12/08/20 0001      Exercises   Exercises Knee/Hip      Knee/Hip Exercises: Aerobic   Nustep L3, seat 7 x10 min      Knee/Hip Exercises: Supine   Quad Sets Strengthening;Right;20 reps;Limitations    Quad Sets Limitations 5 sec    Short Arc Quad Sets Strengthening;Right;2 sets;10 reps    Heel Slides AAROM;Right;2 sets;10 reps    Hip Adduction Isometric Strengthening;Both;20 reps    Straight Leg Raises Strengthening;Right;5 reps   reported pain   Other Supine Knee/Hip Exercises R HS set 5 sec hold      Modalities   Modalities Vasopneumatic      Vasopneumatic   Number Minutes Vasopneumatic  15 minutes    Vasopnuematic Location  Knee    Vasopneumatic Pressure Low    Vasopneumatic Temperature  34/edema                        PT Long Term Goals - 12/01/20 1158      PT LONG TERM GOAL #1   Title Patient will be independent with HEP and its progression.    Time 6    Period Weeks    Status New      PT LONG TERM GOAL #2   Title Patient will demonstrate 3 degrees or less of right knee extension AROM to improve gait mechanics.    Time 6    Period Weeks    Status New      PT LONG TERM GOAL #3   Title Patient will ambulate a community distance with no AD and minimal gait deviations and right knee pain less than or equal to 4/10 to access areas of home and return to community ambulation without support.    Time 6    Period Weeks    Status New      PT LONG TERM GOAL #4   Title Patient will demonstrate 4/5 or greater right knee MMT in all planes to improve stability during functional tasks.    Time 6    Period Weeks    Status New      PT LONG TERM GOAL #5   Title Patient will negotiate steps with a reciprocal pattern and no railing to safely enter and exit home.    Time 6    Period Weeks    Status New                 Plan - 12/08/20 1018    Clinical Impression Statement Patient presented in clinic with reports of continued R knee pain and locking sensation as well as deficient sensation along lateral knee. Patient able to complete all low level strengthening but did report greater discomfort with SLR, tibial tuberosity discomfort. Patient experienced locking of R knee while in SL with knee flexion and only unlocked by patient forcing knee extension and it popped. Patient still reluctant with R knee ROM. Normal vasopnuematic response noted following removal of the modality.    Personal Factors and Comorbidities Comorbidity 3+;Time since onset of injury/illness/exacerbation    Comorbidities anxiety, depression, GERD, history of cerebral venous sinus thrombosis    Examination-Activity Limitations Bathing;Locomotion Level;Transfers;Hygiene/Grooming;Dressing;Stairs;Sleep     Examination-Participation Restrictions Occupation;Meal Prep;Cleaning;Laundry  Stability/Clinical Decision Making Stable/Uncomplicated    Rehab Potential Fair    PT Frequency 2x / week    PT Duration 6 weeks    PT Treatment/Interventions ADLs/Self Care Home Management;Cryotherapy;Electrical Stimulation;Iontophoresis 4mg /ml Dexamethasone;Moist Heat;Ultrasound;Neuromuscular re-education;Balance training;Therapeutic exercise;Therapeutic activities;Functional mobility training;Gait training;Passive range of motion;Manual techniques;Vasopneumatic Device    PT Next Visit Plan Nustep, pain free strengthening and ROM,  modalities prn for pain relief    PT Home Exercise Plan see patient education section    Consulted and Agree with Plan of Care Patient           Patient will benefit from skilled therapeutic intervention in order to improve the following deficits and impairments:  Abnormal gait,Difficulty walking,Decreased range of motion,Decreased activity tolerance,Decreased balance,Decreased strength,Impaired sensation,Pain  Visit Diagnosis: Acute pain of right knee  Difficulty in walking, not elsewhere classified  Localized edema  Muscle weakness (generalized)     Problem List Patient Active Problem List   Diagnosis Date Noted  . History of cerebral venous sinus thrombosis 11/17/2020  . Pure hypercholesterolemia 11/17/2020  . Family history of breast cancer 05/26/2020  . GAD (generalized anxiety disorder) 03/28/2020  . Depression, major, single episode, moderate (HCC) 03/28/2020  . Bilateral plantar fasciitis 09/03/2019  . OAB (overactive bladder) 09/03/2019  . Cerebral venous sinus thrombosis 01/24/2019  . Dislocation of jaw 05/15/2018  . TMJ syndrome 05/15/2018  . Asplenia   . Anxiety and depression     05/17/2018, PTA 12/08/2020, 10:41 AM  [facility] [address] [place], [place], [place] Phone: [phone]   Fax:   [phone]  Name: MIRELY PANGLE MRN: Denton Meek Date of Birth: 06/26/1966

## 2020-12-09 ENCOUNTER — Encounter (HOSPITAL_BASED_OUTPATIENT_CLINIC_OR_DEPARTMENT_OTHER): Payer: Self-pay

## 2020-12-09 ENCOUNTER — Inpatient Hospital Stay (HOSPITAL_BASED_OUTPATIENT_CLINIC_OR_DEPARTMENT_OTHER): Admission: RE | Admit: 2020-12-09 | Payer: BC Managed Care – PPO | Source: Ambulatory Visit

## 2020-12-09 ENCOUNTER — Ambulatory Visit (HOSPITAL_BASED_OUTPATIENT_CLINIC_OR_DEPARTMENT_OTHER)
Admission: RE | Admit: 2020-12-09 | Discharge: 2020-12-09 | Disposition: A | Discharge status: Home / Self Care | Payer: BC Managed Care – PPO | Source: Ambulatory Visit | Attending: Obstetrics & Gynecology | Admitting: Obstetrics & Gynecology

## 2020-12-09 DIAGNOSIS — Z1231 Encounter for screening mammogram for malignant neoplasm of breast: Secondary | ICD-10-CM | POA: Diagnosis not present

## 2020-12-09 DIAGNOSIS — Z803 Family history of malignant neoplasm of breast: Secondary | ICD-10-CM | POA: Insufficient documentation

## 2020-12-11 ENCOUNTER — Other Ambulatory Visit: Payer: Self-pay

## 2020-12-11 ENCOUNTER — Ambulatory Visit: Payer: BC Managed Care – PPO | Admitting: Physical Therapy

## 2020-12-11 ENCOUNTER — Encounter: Payer: Self-pay | Admitting: Physical Therapy

## 2020-12-11 DIAGNOSIS — M25561 Pain in right knee: Secondary | ICD-10-CM | POA: Diagnosis not present

## 2020-12-11 DIAGNOSIS — R600 Localized edema: Secondary | ICD-10-CM | POA: Diagnosis not present

## 2020-12-11 DIAGNOSIS — M6281 Muscle weakness (generalized): Secondary | ICD-10-CM | POA: Diagnosis not present

## 2020-12-11 DIAGNOSIS — R262 Difficulty in walking, not elsewhere classified: Secondary | ICD-10-CM | POA: Diagnosis not present

## 2020-12-11 NOTE — Therapy (Signed)
[facility] [address] [place], [place], [address] Phone: [phone]   Fax:  [phone]  Physical Therapy Treatment  Patient Details  Name: Cathy Simmons MRN: [id number] Date of Birth: [birth year] Referring Provider (PT): Ulyses Southward, PA-C   Encounter Date: 12/11/2020   PT End of Session - 12/11/20 0957    Visit Number 3    Number of Visits 12    Date for PT Re-Evaluation 01/19/21    Authorization Type BC[employer], Progress note every 10th visit    PT Start Time 0948    PT Stop Time 1030    PT Time Calculation (min) 42 min    Activity Tolerance Patient limited by pain    Behavior During Therapy [facility] for tasks assessed/performed           Past Medical History:  Diagnosis Date  . Anxiety   . Asplenia   . Depression   . Family history of anesthesia complication    mother difficult to wake  . GERD (gastroesophageal reflux disease)   . History of blood clots   . Hyperlipidemia     Past Surgical History:  Procedure Laterality Date  . BREAST BIOPSY Right    needle core biopsy, benign  . BREAST EXCISIONAL BIOPSY Left    2 times left breast, benign both times  . BREAST SURGERY  breat biopsy x2 last 1995  . DILATION AND CURETTAGE OF UTERUS  x2 1984 and 1991  . excision left ganglion cyst Left   . FOREIGN BODY REMOVAL Right 01/29/2013   Procedure: EXCISION FOREIGN BODY RIGHT SMALL FINGER;  Surgeon: Nicki Reaper, MD;  Location: [facility];  Service: Orthopedics;  Laterality: Right;  . removal spleen  2000    There were no vitals filed for this visit.   Subjective Assessment - 12/11/20 0951    Subjective COVID-19 screening performed upon arrival. Patient arrived with ongoing 4/10 pain. Reports her knee locked up in flexion again.    Pertinent History anxiety, depression, GERD, history of cerebral venous sinus thrombosis, tape allergy    Limitations Standing;Walking;House hold activities    How long can you sit  comfortably? anxiety, depression, GERD, history of cerebral venous sinus thrombosis    How long can you stand comfortably? 3 hrs    How long can you walk comfortably? short periods but requires assistance from cart    Diagnostic tests x-ray : (-) for fractures    Patient Stated Goals return to work and get back to walking program    Currently in Pain? Yes    Pain Score 4     Pain Location Knee    Pain Orientation Right    Pain Descriptors / Indicators Discomfort    Pain Type Acute pain    Pain Onset 1 to 4 weeks ago    Pain Frequency Constant              [facility] PT Assessment - 12/11/20 0001      Assessment   Medical Diagnosis R knee pain    Referring Provider (PT) Ulyses Southward, PA-C    Onset Date/Surgical Date 11/13/20    Next MD Visit 12/23/2020    Prior Therapy no      Precautions   Precautions None                         [facility] Adult PT Treatment/Exercise - 12/11/20 0001      Exercises  Exercises Knee/Hip      Knee/Hip Exercises: Aerobic   Recumbent Bike level 2 x10 mins Seat 3      Knee/Hip Exercises: Seated   Other Seated Knee/Hip Exercises rockerboard x3 minutes for DF/PF stretching      Knee/Hip Exercises: Supine   Short Arc Quad Sets Strengthening;Right;2 sets;10 reps    Hip Adduction Isometric Strengthening;Both;20 reps    Bridges Limitations attempted but pain    Other Supine Knee/Hip Exercises glute sets 5" hold x20    Other Supine Knee/Hip Exercises clam shell x20 yellow      Modalities   Modalities Vasopneumatic      Vasopneumatic   Number Minutes Vasopneumatic  15 minutes    Vasopnuematic Location  Knee    Vasopneumatic Pressure Low    Vasopneumatic Temperature  34/edema                       PT Long Term Goals - 12/01/20 1158      PT LONG TERM GOAL #1   Title Patient will be independent with HEP and its progression.    Time 6    Period Weeks    Status New      PT LONG TERM GOAL #2   Title Patient will  demonstrate 3 degrees or less of right knee extension AROM to improve gait mechanics.    Time 6    Period Weeks    Status New      PT LONG TERM GOAL #3   Title Patient will ambulate a community distance with no AD and minimal gait deviations and right knee pain less than or equal to 4/10 to access areas of home and return to community ambulation without support.    Time 6    Period Weeks    Status New      PT LONG TERM GOAL #4   Title Patient will demonstrate 4/5 or greater right knee MMT in all planes to improve stability during functional tasks.    Time 6    Period Weeks    Status New      PT LONG TERM GOAL #5   Title Patient will negotiate steps with a reciprocal pattern and no railing to safely enter and exit home.    Time 6    Period Weeks    Status New                 Plan - 12/11/20 1024    Clinical Impression Statement Patient responded fairly well to therapy session though with slight reports of increased pain in right knee. Patient guided through supine TEs with fair response. Patient demonstrated good form with all TEs. Bridges attempted but with reports of pain therefore exercise regressed to glute sets with no complaints. No adverse affects upon removal of modalities.    Personal Factors and Comorbidities Comorbidity 3+;Time since onset of injury/illness/exacerbation    Comorbidities anxiety, depression, GERD, history of cerebral venous sinus thrombosis    Examination-Activity Limitations Bathing;Locomotion Level;Transfers;Hygiene/Grooming;Dressing;Stairs;Sleep    Examination-Participation Restrictions Occupation;Meal Prep;Cleaning;Laundry    Stability/Clinical Decision Making Stable/Uncomplicated    Clinical Decision Making Low    Rehab Potential Fair    PT Frequency 2x / week    PT Treatment/Interventions ADLs/Self Care Home Management;Cryotherapy;Electrical Stimulation;Iontophoresis 4mg /ml Dexamethasone;Moist Heat;Ultrasound;Neuromuscular re-education;Balance  training;Therapeutic exercise;Therapeutic activities;Functional mobility training;Gait training;Passive range of motion;Manual techniques;Vasopneumatic Device    PT Next Visit Plan Nustep, pain free strengthening and ROM,  modalities prn for pain relief  PT Home Exercise Plan see patient education section    Consulted and Agree with Plan of Care Patient           Patient will benefit from skilled therapeutic intervention in order to improve the following deficits and impairments:  Abnormal gait,Difficulty walking,Decreased range of motion,Decreased activity tolerance,Decreased balance,Decreased strength,Impaired sensation,Pain  Visit Diagnosis: Acute pain of right knee  Difficulty in walking, not elsewhere classified  Localized edema  Muscle weakness (generalized)     Problem List Patient Active Problem List   Diagnosis Date Noted  . History of cerebral venous sinus thrombosis 11/17/2020  . Pure hypercholesterolemia 11/17/2020  . Family history of breast cancer 05/26/2020  . GAD (generalized anxiety disorder) 03/28/2020  . Depression, major, single episode, moderate (HCC) 03/28/2020  . Bilateral plantar fasciitis 09/03/2019  . OAB (overactive bladder) 09/03/2019  . Cerebral venous sinus thrombosis 01/24/2019  . Dislocation of jaw 05/15/2018  . TMJ syndrome 05/15/2018  . Asplenia   . Anxiety and depression     Guss Bunde, PT, DPT 12/11/2020, 10:28 AM  [facility] [address] [place], [place], [address] Phone: [phone]   Fax:  [phone]  Name: Cathy Simmons MRN: [id number] Date of Birth: 1966-06-13

## 2020-12-16 ENCOUNTER — Other Ambulatory Visit: Payer: Self-pay

## 2020-12-16 ENCOUNTER — Ambulatory Visit: Payer: BC Managed Care – PPO | Admitting: Physical Therapy

## 2020-12-16 ENCOUNTER — Encounter: Payer: Self-pay | Admitting: Physical Therapy

## 2020-12-16 DIAGNOSIS — R600 Localized edema: Secondary | ICD-10-CM | POA: Diagnosis not present

## 2020-12-16 DIAGNOSIS — R262 Difficulty in walking, not elsewhere classified: Secondary | ICD-10-CM | POA: Diagnosis not present

## 2020-12-16 DIAGNOSIS — M25561 Pain in right knee: Secondary | ICD-10-CM

## 2020-12-16 DIAGNOSIS — M6281 Muscle weakness (generalized): Secondary | ICD-10-CM

## 2020-12-16 NOTE — Therapy (Signed)
[facility] [place], [place], [address] Phone: [phone]   Fax:  [phone]  Physical Therapy Treatment  Patient Details  Name: Cathy Simmons MRN: [id number] Date of Birth: 20-May-1966 Referring Provider (PT): Patrecia Pace, PA-C   Encounter Date: 12/16/2020   PT End of Session - 12/16/20 0949    Visit Number 4    Number of Visits 12    Date for PT Re-Evaluation 01/19/21    Authorization Type [employer], Progress note every 10th visit    PT Start Time 0948    PT Stop Time 1029    PT Time Calculation (min) 41 min    Activity Tolerance Patient limited by pain    Behavior During Therapy [facility] for tasks assessed/performed           Past Medical History:  Diagnosis Date  . Anxiety   . Asplenia   . Depression   . Family history of anesthesia complication    mother difficult to wake  . GERD (gastroesophageal reflux disease)   . History of blood clots   . Hyperlipidemia     Past Surgical History:  Procedure Laterality Date  . BREAST BIOPSY Right    needle core biopsy, benign  . BREAST EXCISIONAL BIOPSY Left    2 times left breast, benign both times  . BREAST SURGERY  breat biopsy x2 last 1995  . DILATION AND CURETTAGE OF UTERUS  x2 1984 and 1991  . excision left ganglion cyst Left   . FOREIGN BODY REMOVAL Right 01/29/2013   Procedure: EXCISION FOREIGN BODY RIGHT SMALL FINGER;  Surgeon: Wynonia Sours, MD;  Location: [place];  Service: Orthopedics;  Laterality: Right;  . removal spleen  2000    There were no vitals filed for this visit.   Subjective Assessment - 12/16/20 0949    Subjective COVID-19 screening performed upon arrival. Patient arrived with knee soreness.    Pertinent History anxiety, depression, GERD, history of cerebral venous sinus thrombosis, tape allergy    Limitations Standing;Walking;House hold activities    How long can you sit comfortably? anxiety, depression, GERD, history of  cerebral venous sinus thrombosis    How long can you stand comfortably? 3 hrs    How long can you walk comfortably? short periods but requires assistance from cart    Diagnostic tests x-ray : (-) for fractures    Patient Stated Goals return to work and get back to walking program    Currently in Pain? Yes    Pain Score 4     Pain Location Knee    Pain Orientation Right    Pain Descriptors / Indicators Sore    Pain Type Acute pain    Pain Onset 1 to 4 weeks ago    Pain Frequency Constant              [facility] PT Assessment - 12/16/20 0001      Assessment   Medical Diagnosis R knee pain    Referring Provider (PT) Patrecia Pace, PA-C    Onset Date/Surgical Date 11/13/20    Next MD Visit 12/23/2020    Prior Therapy no      Precautions   Precautions None      Restrictions   Weight Bearing Restrictions No      ROM / Strength   AROM / PROM / Strength AROM      AROM   Overall AROM  Within functional limits for tasks performed  pain   AROM Assessment Site Knee    Right/Left Knee Right    Right Knee Extension 2    Right Knee Flexion 115                         [facility] Adult PT Treatment/Exercise - 12/16/20 0001      Knee/Hip Exercises: Aerobic   Nustep L2, seat 6 x10 min      Knee/Hip Exercises: Supine   Short Arc Quad Sets Strengthening;Right;2 sets;10 reps    Hip Adduction Isometric Strengthening;Both;20 reps    Other Supine Knee/Hip Exercises glute sets 5" hold x20, R HS set x20 reps    Other Supine Knee/Hip Exercises clam shell x20 red theraband      Modalities   Modalities Vasopneumatic      Vasopneumatic   Number Minutes Vasopneumatic  15 minutes    Vasopnuematic Location  Knee    Vasopneumatic Pressure Medium    Vasopneumatic Temperature  34/edema                       PT Long Term Goals - 12/16/20 1021      PT LONG TERM GOAL #1   Title Patient will be independent with HEP and its progression.    Time 6    Period Weeks     Status Partially Met      PT LONG TERM GOAL #2   Title Patient will demonstrate 3 degrees or less of right knee extension AROM to improve gait mechanics.    Time 6    Period Weeks    Status Achieved      PT LONG TERM GOAL #3   Title Patient will ambulate a community distance with no AD and minimal gait deviations and right knee pain less than or equal to 4/10 to access areas of home and return to community ambulation without support.    Time 6    Period Weeks    Status On-going      PT LONG TERM GOAL #4   Title Patient will demonstrate 4/5 or greater right knee MMT in all planes to improve stability during functional tasks.    Time 6    Period Weeks    Status On-going      PT LONG TERM GOAL #5   Title Patient will negotiate steps with a reciprocal pattern and no railing to safely enter and exit home.    Time 6    Period Weeks    Status On-going                 Plan - 12/16/20 1021    Clinical Impression Statement Patient presented in clinic with reports of R knee soreness. Patient very cautious with therex and any motion due to pain and soreness. Patient reports compliance with HEP but develops soreness afterwards. Patient's R knee observed with increased edema superior to R patell and is very tender surrounding R patella. Patient reports now ambulating with L hip ER and weightbearing along lateral foot. Patient VC'd with demonstration for overexaggeration or IR and proper heel/toe strike to prevent any further negetive compensations. Patient reported pain with any R knee ROM. AROM of R knee measured as 2-115 deg. Normal vasopneumatic response noted following removal of the modality.    Personal Factors and Comorbidities Comorbidity 3+;Time since onset of injury/illness/exacerbation    Comorbidities anxiety, depression, GERD, history of cerebral venous sinus thrombosis    Examination-Activity  Limitations Bathing;Locomotion Level;Transfers;Hygiene/Grooming;Dressing;Stairs;Sleep     Examination-Participation Restrictions Occupation;Meal Prep;Cleaning;Laundry    Stability/Clinical Decision Making Stable/Uncomplicated    Rehab Potential Fair    PT Frequency 2x / week    PT Duration 6 weeks    PT Treatment/Interventions ADLs/Self Care Home Management;Cryotherapy;Electrical Stimulation;Iontophoresis 49m/ml Dexamethasone;Moist Heat;Ultrasound;Neuromuscular re-education;Balance training;Therapeutic exercise;Therapeutic activities;Functional mobility training;Gait training;Passive range of motion;Manual techniques;Vasopneumatic Device    PT Next Visit Plan Nustep, pain free strengthening and ROM,  modalities prn for pain relief    PT Home Exercise Plan see patient education section    Consulted and Agree with Plan of Care Patient           Patient will benefit from skilled therapeutic intervention in order to improve the following deficits and impairments:  Abnormal gait,Difficulty walking,Decreased range of motion,Decreased activity tolerance,Decreased balance,Decreased strength,Impaired sensation,Pain  Visit Diagnosis: Acute pain of right knee  Difficulty in walking, not elsewhere classified  Localized edema  Muscle weakness (generalized)     Problem List Patient Active Problem List   Diagnosis Date Noted  . History of cerebral venous sinus thrombosis 11/17/2020  . Pure hypercholesterolemia 11/17/2020  . Family history of breast cancer 05/26/2020  . GAD (generalized anxiety disorder) 03/28/2020  . Depression, major, [place] episode, moderate [place] 03/28/2020  . Bilateral plantar fasciitis 09/03/2019  . OAB (overactive bladder) 09/03/2019  . Cerebral venous sinus thrombosis 01/24/2019  . Dislocation of jaw 05/15/2018  . TMJ syndrome 05/15/2018  . Asplenia   . Anxiety and depression     KStandley Brooking PTA 12/16/2020, 10:48 AM  [facility]MM[address] [place] [address]Phone: [phone]  Fax:   [phone] Name: PBRANDA CHAUDHARYMRN: [id number] of Birth: [birth year]

## 2020-12-18 ENCOUNTER — Other Ambulatory Visit: Payer: Self-pay

## 2020-12-18 ENCOUNTER — Ambulatory Visit: Payer: BC Managed Care – PPO | Admitting: Physical Therapy

## 2020-12-18 DIAGNOSIS — R262 Difficulty in walking, not elsewhere classified: Secondary | ICD-10-CM | POA: Diagnosis not present

## 2020-12-18 DIAGNOSIS — M6281 Muscle weakness (generalized): Secondary | ICD-10-CM

## 2020-12-18 DIAGNOSIS — R600 Localized edema: Secondary | ICD-10-CM | POA: Diagnosis not present

## 2020-12-18 DIAGNOSIS — M25561 Pain in right knee: Secondary | ICD-10-CM

## 2020-12-18 NOTE — Therapy (Signed)
[facility] [place], [place], [address] Phone: [phone]   Fax:  [phone]  Physical Therapy Treatment  Patient Details  Name: Cathy Simmons MRN: [id number] Date of Birth: 12-01-1965 Referring Provider (PT): Patrecia Pace, PA-C   Encounter Date: 12/18/2020   PT End of Session - 12/18/20 1004    Visit Number 5    Number of Visits 12    Date for PT Re-Evaluation 01/19/21    Authorization Type BCBS, Progress note every 10th visit    PT Start Time [phone]    PT Stop Time 1037    PT Time Calculation (min) 44 min    Activity Tolerance Patient tolerated treatment well    Behavior During Therapy [facility] for tasks assessed/performed           Past Medical History:  Diagnosis Date  . Anxiety   . Asplenia   . Depression   . Family history of anesthesia complication    mother difficult to wake  . GERD (gastroesophageal reflux disease)   . History of blood clots   . Hyperlipidemia     Past Surgical History:  Procedure Laterality Date  . BREAST BIOPSY Right    needle core biopsy, benign  . BREAST EXCISIONAL BIOPSY Left    2 times left breast, benign both times  . BREAST SURGERY  breat biopsy x2 last 1995  . DILATION AND CURETTAGE OF UTERUS  x2 1984 and 1991  . excision left ganglion cyst Left   . FOREIGN BODY REMOVAL Right 01/29/2013   Procedure: EXCISION FOREIGN BODY RIGHT SMALL FINGER;  Surgeon: Wynonia Sours, MD;  Location: [facility];  Service: Orthopedics;  Laterality: Right;  . removal spleen  2000    There were no vitals filed for this visit.   Subjective Assessment - 12/18/20 0954    Subjective COVID-19 screening performed upon arrival. Patient arrived with some ongoing pain and was more last night    Pertinent History anxiety, depression, GERD, history of cerebral venous sinus thrombosis, tape allergy    Limitations Standing;Walking;House hold activities    How long can you sit comfortably?  anxiety, depression, GERD, history of cerebral venous sinus thrombosis    How long can you stand comfortably? 3 hrs    How long can you walk comfortably? short periods but requires assistance from cart    Diagnostic tests x-ray : (-) for fractures    Patient Stated Goals return to work and get back to walking program    Currently in Pain? Yes    Pain Score 3     Pain Location Knee    Pain Orientation Right    Pain Descriptors / Indicators Sore    Pain Type Acute pain    Pain Onset 1 to 4 weeks ago    Pain Frequency Constant    Aggravating Factors  prpolong walking/standing    Pain Relieving Factors rest                             [facility] Adult PT Treatment/Exercise - 12/18/20 0001      Knee/Hip Exercises: Stretches   Piriformis Stretch 20 seconds;Right;3 reps      Knee/Hip Exercises: Aerobic   Nustep L3 x80mn, monitored      Knee/Hip Exercises: Seated   Hamstring Curl Strengthening;Right;20 reps    Hamstring Limitations red band      Knee/Hip Exercises: Supine   Short Arc  Quad Sets Strengthening;Right;2 sets;10 reps    Short [employer] Limitations 1#    Terminal Knee Extension Strengthening;Right;20 reps;Theraband    Theraband Level (Terminal Knee Extension) Level 2 (Red)    Hip Adduction Isometric Strengthening;Both;20 reps    Other Supine Knee/Hip Exercises clam shell x20 red theraband RT LE only      Knee/Hip Exercises: Sidelying   Clams RT x20      Vasopneumatic   Number Minutes Vasopneumatic  15 minutes    Vasopnuematic Location  Knee    Vasopneumatic Pressure Medium    Vasopneumatic Temperature  34/edema                       PT Long Term Goals - 12/16/20 1021      PT LONG TERM GOAL #1   Title Patient will be independent with HEP and its progression.    Time 6    Period Weeks    Status Partially Met      PT LONG TERM GOAL #2   Title Patient will demonstrate 3 degrees or less of right knee extension AROM to improve gait  mechanics.    Time 6    Period Weeks    Status Achieved      PT LONG TERM GOAL #3   Title Patient will ambulate a community distance with no AD and minimal gait deviations and right knee pain less than or equal to 4/10 to access areas of home and return to community ambulation without support.    Time 6    Period Weeks    Status On-going      PT LONG TERM GOAL #4   Title Patient will demonstrate 4/5 or greater right knee MMT in all planes to improve stability during functional tasks.    Time 6    Period Weeks    Status On-going      PT LONG TERM GOAL #5   Title Patient will negotiate steps with a reciprocal pattern and no railing to safely enter and exit home.    Time 6    Period Weeks    Status On-going                 Plan - 12/18/20 1023    Clinical Impression Statement Patient tolerated treatment fair due to right knee and hip pain. Patient able to progress exercises for pain free strengthening. Patient continues to have limitations with prolong walking and standing. Patient current goals ongoing due to pain deficts.    Personal Factors and Comorbidities Comorbidity 3+;Time since onset of injury/illness/exacerbation    Comorbidities anxiety, depression, GERD, history of cerebral venous sinus thrombosis    Examination-Activity Limitations Bathing;Locomotion Level;Transfers;Hygiene/Grooming;Dressing;Stairs;Sleep    Examination-Participation Restrictions Occupation;Meal Prep;Cleaning;Laundry    Stability/Clinical Decision Making Stable/Uncomplicated    Rehab Potential Fair    PT Frequency 2x / week    PT Duration 6 weeks    PT Treatment/Interventions ADLs/Self Care Home Management;Cryotherapy;Electrical Stimulation;Iontophoresis 18m/ml Dexamethasone;Moist Heat;Ultrasound;Neuromuscular re-education;Balance training;Therapeutic exercise;Therapeutic activities;Functional mobility training;Gait training;Passive range of motion;Manual techniques;Vasopneumatic Device    PT Next  Visit Plan Nustep, pain free strengthening and ROM,  modalities prn for pain relief    Consulted and Agree with Plan of Care Patient           Patient will benefit from skilled therapeutic intervention in order to improve the following deficits and impairments:  Abnormal gait,Difficulty walking,Decreased range of motion,Decreased activity tolerance,Decreased balance,Decreased strength,Impaired sensation,Pain  Visit Diagnosis: Acute pain  of right knee  Difficulty in walking, not elsewhere classified  Localized edema  Muscle weakness (generalized)     Problem List Patient Active Problem List   Diagnosis Date Noted  . History of cerebral venous sinus thrombosis 11/17/2020  . Pure hypercholesterolemia 11/17/2020  . Family history of breast cancer 05/26/2020  . GAD (generalized anxiety disorder) 03/28/2020  . Depression, major, single episode, moderate ([place]) 03/28/2020  . Bilateral plantar fasciitis 09/03/2019  . OAB (overactive bladder) 09/03/2019  . Cerebral venous sinus thrombosis 01/24/2019  . Dislocation of jaw 05/15/2018  . TMJ syndrome 05/15/2018  . Asplenia   . Anxiety and depression     Josimar Corning P, PTA 12/18/2020, 10:48 AM  [facility] [place], [place], [address] Phone: [phone]   Fax:  [phone]  Name: Cathy Simmons MRN: [id number] Date of Birth: [birth year]

## 2020-12-23 DIAGNOSIS — M25561 Pain in right knee: Secondary | ICD-10-CM | POA: Diagnosis not present

## 2020-12-24 ENCOUNTER — Other Ambulatory Visit: Payer: Self-pay

## 2020-12-24 ENCOUNTER — Encounter: Payer: Self-pay | Admitting: Physical Therapy

## 2020-12-24 ENCOUNTER — Ambulatory Visit: Payer: BC Managed Care – PPO | Admitting: Physical Therapy

## 2020-12-24 DIAGNOSIS — M6281 Muscle weakness (generalized): Secondary | ICD-10-CM

## 2020-12-24 DIAGNOSIS — R600 Localized edema: Secondary | ICD-10-CM

## 2020-12-24 DIAGNOSIS — R262 Difficulty in walking, not elsewhere classified: Secondary | ICD-10-CM

## 2020-12-24 DIAGNOSIS — M25561 Pain in right knee: Secondary | ICD-10-CM | POA: Diagnosis not present

## 2020-12-24 NOTE — Therapy (Signed)
[facility] [address] [place], [place], [address] Phone: [phone]   Fax:  [phone]  Physical Therapy Treatment  Patient Details  Name: Cathy Simmons MRN: [id number] Date of Birth: 03/03/1966 Referring Provider (PT): Sarah Yacobi, PA-C   Encounter Date: 12/24/2020   PT End of Session - 12/24/20 0958    Visit Number 6    Number of Visits 12    Date for PT Re-Evaluation 01/19/21    Authorization Type BCBS, Progress note every 10th visit    PT Start Time 0951    PT Stop Time 1030    PT Time Calculation (min) 39 min    Activity Tolerance Patient limited by pain    Behavior During Therapy [facility] for tasks assessed/performed           Past Medical History:  Diagnosis Date  . Anxiety   . Asplenia   . Depression   . Family history of anesthesia complication    mother difficult to wake  . GERD (gastroesophageal reflux disease)   . History of blood clots   . Hyperlipidemia     Past Surgical History:  Procedure Laterality Date  . BREAST BIOPSY Right    needle core biopsy, benign  . BREAST EXCISIONAL BIOPSY Left    2 times left breast, benign both times  . BREAST SURGERY  breat biopsy x2 last 1995  . DILATION AND CURETTAGE OF UTERUS  x2 1984 and 1991  . excision left ganglion cyst Left   . FOREIGN BODY REMOVAL Right 01/29/2013   Procedure: EXCISION FOREIGN BODY RIGHT SMALL FINGER;  Surgeon: Gary R Kuzma, MD;  Location: [facility];  Service: Orthopedics;  Laterality: Right;  . removal spleen  2000    There were no vitals filed for this visit.   Subjective Assessment - 12/24/20 0952    Subjective COVID-19 screening performed upon arrival. Reports that she went to MD and reports more pain after manipulation yesterday. Patient is to be scheduled for an MRI.    Pertinent History anxiety, depression, GERD, history of cerebral venous sinus thrombosis, tape allergy    Limitations Standing;Walking;House hold  activities    How long can you sit comfortably? anxiety, depression, GERD, history of cerebral venous sinus thrombosis    How long can you stand comfortably? 3 hrs    How long can you walk comfortably? short periods but requires assistance from cart    Diagnostic tests x-ray : (-) for fractures    Patient Stated Goals return to work and get back to walking program    Currently in Pain? Yes    Pain Score 5     Pain Location Knee    Pain Orientation Right    Pain Descriptors / Indicators Discomfort    Pain Type Acute pain    Pain Onset 1 to 4 weeks ago    Pain Frequency Constant              [facility] PT Assessment - 12/24/20 0001      Assessment   Medical Diagnosis R knee pain    Referring Provider (PT) Sarah Yacobi, PA-C    Onset Date/Surgical Date 11/13/20    Next MD Visit After MRI    Prior Therapy no      Precautions   Precautions None      Restrictions   Weight Bearing Restrictions No                           [facility] Adult PT Treatment/Exercise - 12/24/20 0001      Knee/Hip Exercises: Aerobic   Nustep L3 x58mn, monitored      Knee/Hip Exercises: Supine   Short Arc Quad Sets Strengthening;Right;2 sets;10 reps    Terminal Knee Extension Strengthening;Right;10 reps;Theraband    Theraband Level (Terminal Knee Extension) Level 1 (Yellow)    Terminal Knee Extension Limitations greater pain    Hip Adduction Isometric Strengthening;Both;20 reps    Straight Leg Raises Strengthening;Right;5 reps    Other Supine Knee/Hip Exercises clam shell x20 red theraband RT LE only      Modalities   Modalities Vasopneumatic      Vasopneumatic   Number Minutes Vasopneumatic  10 minutes    Vasopnuematic Location  Knee    Vasopneumatic Pressure Medium    Vasopneumatic Temperature  34/edema                       PT Long Term Goals - 12/16/20 1021      PT LONG TERM GOAL #1   Title Patient will be independent with HEP and its progression.    Time 6     Period Weeks    Status Partially Met      PT LONG TERM GOAL #2   Title Patient will demonstrate 3 degrees or less of right knee extension AROM to improve gait mechanics.    Time 6    Period Weeks    Status Achieved      PT LONG TERM GOAL #3   Title Patient will ambulate a community distance with no AD and minimal gait deviations and right knee pain less than or equal to 4/10 to access areas of home and return to community ambulation without support.    Time 6    Period Weeks    Status On-going      PT LONG TERM GOAL #4   Title Patient will demonstrate 4/5 or greater right knee MMT in all planes to improve stability during functional tasks.    Time 6    Period Weeks    Status On-going      PT LONG TERM GOAL #5   Title Patient will negotiate steps with a reciprocal pattern and no railing to safely enter and exit home.    Time 6    Period Weeks    Status On-going                 Plan - 12/24/20 1035    Clinical Impression Statement Patient presented in clinic with reports of more R knee soreness after MD visit in which MD manipulated R knee. Patient experienced greater pain with TKE with light resistance. Patient demonstrating weakness of R quad as well with SAQ and SLR as well due to limited ROM. Patient awaiting to be scheduled for MRI. Normal vasopneumatic response noted following removal of the modality.    Personal Factors and Comorbidities Comorbidity 3+;Time since onset of injury/illness/exacerbation    Comorbidities anxiety, depression, GERD, history of cerebral venous sinus thrombosis    Examination-Activity Limitations Bathing;Locomotion Level;Transfers;Hygiene/Grooming;Dressing;Stairs;Sleep    Examination-Participation Restrictions Occupation;Meal Prep;Cleaning;Laundry    Stability/Clinical Decision Making Stable/Uncomplicated    Rehab Potential Fair    PT Frequency 2x / week    PT Duration 6 weeks    PT Treatment/Interventions ADLs/Self Care Home  Management;Cryotherapy;Electrical Stimulation;Iontophoresis 441mml Dexamethasone;Moist Heat;Ultrasound;Neuromuscular re-education;Balance training;Therapeutic exercise;Therapeutic activities;Functional mobility training;Gait training;Passive range of motion;Manual techniques;Vasopneumatic Device    PT Next Visit Plan Nustep, pain free  strengthening and ROM,  modalities prn for pain relief    PT Home Exercise Plan see patient education section    Consulted and Agree with Plan of Care Patient           Patient will benefit from skilled therapeutic intervention in order to improve the following deficits and impairments:  Abnormal gait,Difficulty walking,Decreased range of motion,Decreased activity tolerance,Decreased balance,Decreased strength,Impaired sensation,Pain  Visit Diagnosis: Acute pain of right knee  Difficulty in walking, not elsewhere classified  Localized edema  Muscle weakness (generalized)     Problem List Patient Active Problem List   Diagnosis Date Noted  . History of cerebral venous sinus thrombosis 11/17/2020  . Pure hypercholesterolemia 11/17/2020  . Family history of breast cancer 05/26/2020  . GAD (generalized anxiety disorder) 03/28/2020  . Depression, major, single episode, moderate ([place]) 03/28/2020  . Bilateral plantar fasciitis 09/03/2019  . OAB (overactive bladder) 09/03/2019  . Cerebral venous sinus thrombosis 01/24/2019  . Dislocation of jaw 05/15/2018  . TMJ syndrome 05/15/2018  . Asplenia   . Anxiety and depression     Standley Brooking, PTA 12/24/2020, 10:38 AM  [facility] [address] [place], [place], [address] Phone: [phone]   Fax:  [phone]  Name: Cathy Simmons MRN: [id number] Date of Birth: 1966/11/16

## 2020-12-26 ENCOUNTER — Ambulatory Visit: Payer: BC Managed Care – PPO | Admitting: Physical Therapy

## 2020-12-26 ENCOUNTER — Encounter: Payer: Self-pay | Admitting: Physical Therapy

## 2020-12-26 ENCOUNTER — Other Ambulatory Visit: Payer: Self-pay

## 2020-12-26 DIAGNOSIS — M6281 Muscle weakness (generalized): Secondary | ICD-10-CM

## 2020-12-26 DIAGNOSIS — R262 Difficulty in walking, not elsewhere classified: Secondary | ICD-10-CM

## 2020-12-26 DIAGNOSIS — M25561 Pain in right knee: Secondary | ICD-10-CM

## 2020-12-26 DIAGNOSIS — R600 Localized edema: Secondary | ICD-10-CM | POA: Diagnosis not present

## 2020-12-26 NOTE — Therapy (Signed)
[facility] [place], [place], [address] Phone: [phone]   Fax:  [phone]  Physical Therapy Treatment  Patient Details  Name: Cathy Simmons MRN: [id number] Date of Birth: January 11, 1966 Referring Provider (PT): Patrecia Pace, PA-C   Encounter Date: 12/26/2020   PT End of Session - 12/26/20 1008    Visit Number 7    Number of Visits 12    Date for PT Re-Evaluation 01/19/21    Authorization Type BCBS, Progress note every 10th visit    PT Start Time 0954    PT Stop Time 1040    PT Time Calculation (min) 46 min    Activity Tolerance Patient limited by pain    Behavior During Therapy [facility] for tasks assessed/performed           Past Medical History:  Diagnosis Date  . Anxiety   . Asplenia   . Depression   . Family history of anesthesia complication    mother difficult to wake  . GERD (gastroesophageal reflux disease)   . History of blood clots   . Hyperlipidemia     Past Surgical History:  Procedure Laterality Date  . BREAST BIOPSY Right    needle core biopsy, benign  . BREAST EXCISIONAL BIOPSY Left    2 times left breast, benign both times  . BREAST SURGERY  breat biopsy x2 last 1995  . DILATION AND CURETTAGE OF UTERUS  x2 1984 and 1991  . excision left ganglion cyst Left   . FOREIGN BODY REMOVAL Right 01/29/2013   Procedure: EXCISION FOREIGN BODY RIGHT SMALL FINGER;  Surgeon: Wynonia Sours, MD;  Location: [facility];  Service: Orthopedics;  Laterality: Right;  . removal spleen  2000    There were no vitals filed for this visit.   Subjective Assessment - 12/26/20 1001    Subjective COVID-19 screening performed upon arrival. Reports her knee pain was "so so."    Pertinent History anxiety, depression, GERD, history of cerebral venous sinus thrombosis, tape allergy    Limitations Standing;Walking;House hold activities    How long can you sit comfortably? anxiety, depression, GERD, history of  cerebral venous sinus thrombosis    How long can you stand comfortably? 3 hrs    How long can you walk comfortably? short periods but requires assistance from cart    Diagnostic tests x-ray : (-) for fractures    Patient Stated Goals return to work and get back to walking program    Currently in Pain? Yes    Pain Location Knee    Pain Orientation Right    Pain Descriptors / Indicators Discomfort    Pain Type Acute pain    Pain Onset 1 to 4 weeks ago    Pain Frequency Constant              [facility] PT Assessment - 12/26/20 0001      Assessment   Medical Diagnosis R knee pain    Referring Provider (PT) Patrecia Pace, PA-C    Onset Date/Surgical Date 11/13/20    Next MD Visit After MRI    Prior Therapy no      Precautions   Precautions None      Restrictions   Weight Bearing Restrictions No                         [facility] Adult PT Treatment/Exercise - 12/26/20 0001      Knee/Hip Exercises: Aerobic  Nustep L3 x90mn, monitored      Knee/Hip Exercises: Supine   Short Arc Quad Sets Strengthening;Right;2 sets;10 reps    Hip Adduction Isometric Strengthening;Both;3 sets;10 reps    Straight Leg Raises Strengthening;Right;20 reps    Other Supine Knee/Hip Exercises clam shell x30 red theraband      Modalities   Modalities Vasopneumatic      Vasopneumatic   Number Minutes Vasopneumatic  10 minutes    Vasopnuematic Location  Knee    Vasopneumatic Pressure Medium    Vasopneumatic Temperature  34/edema                       PT Long Term Goals - 12/16/20 1021      PT LONG TERM GOAL #1   Title Patient will be independent with HEP and its progression.    Time 6    Period Weeks    Status Partially Met      PT LONG TERM GOAL #2   Title Patient will demonstrate 3 degrees or less of right knee extension AROM to improve gait mechanics.    Time 6    Period Weeks    Status Achieved      PT LONG TERM GOAL #3   Title Patient will ambulate a community  distance with no AD and minimal gait deviations and right knee pain less than or equal to 4/10 to access areas of home and return to community ambulation without support.    Time 6    Period Weeks    Status On-going      PT LONG TERM GOAL #4   Title Patient will demonstrate 4/5 or greater right knee MMT in all planes to improve stability during functional tasks.    Time 6    Period Weeks    Status On-going      PT LONG TERM GOAL #5   Title Patient will negotiate steps with a reciprocal pattern and no railing to safely enter and exit home.    Time 6    Period Weeks    Status On-going                 Plan - 12/26/20 1118    Clinical Impression Statement Patient presented in clinic with no pain score provided. Patient able to tolerate light therex with only reports of fatigue. Ankle DF encouraged to promote more quad activation. Patient limits RLE weightbearing in standing upon observation. Limited SLR due to muscle fatigue. Normal vasopneumatic response noted following removal of the modality.    Personal Factors and Comorbidities Comorbidity 3+;Time since onset of injury/illness/exacerbation    Comorbidities anxiety, depression, GERD, history of cerebral venous sinus thrombosis    Examination-Activity Limitations Bathing;Locomotion Level;Transfers;Hygiene/Grooming;Dressing;Stairs;Sleep    Examination-Participation Restrictions Occupation;Meal Prep;Cleaning;Laundry    Stability/Clinical Decision Making Stable/Uncomplicated    Rehab Potential Fair    PT Frequency 2x / week    PT Duration 6 weeks    PT Treatment/Interventions ADLs/Self Care Home Management;Cryotherapy;Electrical Stimulation;Iontophoresis 47mml Dexamethasone;Moist Heat;Ultrasound;Neuromuscular re-education;Balance training;Therapeutic exercise;Therapeutic activities;Functional mobility training;Gait training;Passive range of motion;Manual techniques;Vasopneumatic Device    PT Next Visit Plan Nustep, pain free  strengthening and ROM,  modalities prn for pain relief    PT Home Exercise Plan see patient education section    Consulted and Agree with Plan of Care Patient           Patient will benefit from skilled therapeutic intervention in order to improve the following deficits and impairments:  Abnormal  gait,Difficulty walking,Decreased range of motion,Decreased activity tolerance,Decreased balance,Decreased strength,Impaired sensation,Pain  Visit Diagnosis: Acute pain of right knee  Difficulty in walking, not elsewhere classified  Localized edema  Muscle weakness (generalized)     Problem List Patient Active Problem List   Diagnosis Date Noted  . History of cerebral venous sinus thrombosis 11/17/2020  . Pure hypercholesterolemia 11/17/2020  . Family history of breast cancer 05/26/2020  . GAD (generalized anxiety disorder) 03/28/2020  . Depression, major, single episode, moderate ([place]) 03/28/2020  . Bilateral plantar fasciitis 09/03/2019  . OAB (overactive bladder) 09/03/2019  . Cerebral venous sinus thrombosis 01/24/2019  . Dislocation of jaw 05/15/2018  . TMJ syndrome 05/15/2018  . Asplenia   . Anxiety and depression     Standley Brooking, PTA 12/26/2020, 11:24 AM  [facility] [address] [place], [place], [address] Phone: [phone]   Fax:  [phone]  Name: Cathy Simmons MRN: [id number] Date of Birth: [birth year]

## 2020-12-30 ENCOUNTER — Ambulatory Visit: Payer: BC Managed Care – PPO | Attending: Student | Admitting: Physical Therapy

## 2020-12-30 ENCOUNTER — Other Ambulatory Visit: Payer: Self-pay

## 2020-12-30 ENCOUNTER — Encounter: Payer: Self-pay | Admitting: Physical Therapy

## 2020-12-30 DIAGNOSIS — R262 Difficulty in walking, not elsewhere classified: Secondary | ICD-10-CM

## 2020-12-30 DIAGNOSIS — M25561 Pain in right knee: Secondary | ICD-10-CM | POA: Diagnosis not present

## 2020-12-30 DIAGNOSIS — R600 Localized edema: Secondary | ICD-10-CM | POA: Diagnosis not present

## 2020-12-30 DIAGNOSIS — M6281 Muscle weakness (generalized): Secondary | ICD-10-CM

## 2020-12-30 NOTE — Therapy (Signed)
[facility] [place], [place], [address] Phone: [phone]   Fax:  [phone]  Physical Therapy Treatment  Patient Details  Name: Cathy Simmons MRN: [id number] Date of Birth: Apr 29, 1966 Referring Provider (PT): Patrecia Pace, PA-C   Encounter Date: 12/30/2020   PT End of Session - 12/30/20 0923    Visit Number 8    Number of Visits 12    Date for PT Re-Evaluation 01/19/21    Authorization Type BCBS, Progress note every 10th visit    PT Start Time 0815    PT Stop Time 0908    PT Time Calculation (min) 53 min    Activity Tolerance Patient limited by pain    Behavior During Therapy [facility] for tasks assessed/performed           Past Medical History:  Diagnosis Date  . Anxiety   . Asplenia   . Depression   . Family history of anesthesia complication    mother difficult to wake  . GERD (gastroesophageal reflux disease)   . History of blood clots   . Hyperlipidemia     Past Surgical History:  Procedure Laterality Date  . BREAST BIOPSY Right    needle core biopsy, benign  . BREAST EXCISIONAL BIOPSY Left    2 times left breast, benign both times  . BREAST SURGERY  breat biopsy x2 last 1995  . DILATION AND CURETTAGE OF UTERUS  x2 1984 and 1991  . excision left ganglion cyst Left   . FOREIGN BODY REMOVAL Right 01/29/2013   Procedure: EXCISION FOREIGN BODY RIGHT SMALL FINGER;  Surgeon: Wynonia Sours, MD;  Location: [place];  Service: Orthopedics;  Laterality: Right;  . removal spleen  2000    There were no vitals filed for this visit.   Subjective Assessment - 12/30/20 0851    Subjective COVID-19 screen performed prior to patient entering clinic. Knee still hurting.  CC is pain at lateral knee today.    Pertinent History anxiety, depression, GERD, history of cerebral venous sinus thrombosis, tape allergy    Limitations Standing;Walking;House hold activities    How long can you sit comfortably?  anxiety, depression, GERD, history of cerebral venous sinus thrombosis    How long can you stand comfortably? 3 hrs    How long can you walk comfortably? short periods but requires assistance from cart    Diagnostic tests x-ray : (-) for fractures    Patient Stated Goals return to work and get back to walking program    Currently in Pain? Yes    Pain Score 5     Pain Location Knee    Pain Orientation Right    Pain Descriptors / Indicators Discomfort    Pain Type Acute pain    Pain Onset 1 to 4 weeks ago                             [facility] Adult PT Treatment/Exercise - 12/30/20 0001      Exercises   Exercises Knee/Hip      Knee/Hip Exercises: Aerobic   Nustep Level 3 x 15 minutes.      Modalities   Modalities Moist Heat;Ultrasound      Electrical Stimulation   Electrical Stimulation Location Right knee.    Electrical Stimulation Action IFC    Electrical Stimulation Parameters 80-150 Hz x 20 minutes on 40% scan.    Electrical Stimulation Goals Pain  Ultrasound   Ultrasound Location Right distal ITB region.    Ultrasound Parameters Combo e'stim/US at 1.50 W/CM2 x 8 minutes.    Ultrasound Goals Pain                       PT Long Term Goals - 12/16/20 1021      PT LONG TERM GOAL #1   Title Patient will be independent with HEP and its progression.    Time 6    Period Weeks    Status Partially Met      PT LONG TERM GOAL #2   Title Patient will demonstrate 3 degrees or less of right knee extension AROM to improve gait mechanics.    Time 6    Period Weeks    Status Achieved      PT LONG TERM GOAL #3   Title Patient will ambulate a community distance with no AD and minimal gait deviations and right knee pain less than or equal to 4/10 to access areas of home and return to community ambulation without support.    Time 6    Period Weeks    Status On-going      PT LONG TERM GOAL #4   Title Patient will demonstrate 4/5 or greater right  knee MMT in all planes to improve stability during functional tasks.    Time 6    Period Weeks    Status On-going      PT LONG TERM GOAL #5   Title Patient will negotiate steps with a reciprocal pattern and no railing to safely enter and exit home.    Time 6    Period Weeks    Status On-going                 Plan - 12/30/20 0901    Clinical Impression Statement Patient found to be quite tender to palpation over her right distal ITB region.  She tolerated treatment well today.    Personal Factors and Comorbidities Comorbidity 3+;Time since onset of injury/illness/exacerbation    Comorbidities anxiety, depression, GERD, history of cerebral venous sinus thrombosis    Examination-Activity Limitations Bathing;Locomotion Level;Transfers;Hygiene/Grooming;Dressing;Stairs;Sleep    Examination-Participation Restrictions Occupation;Meal Prep;Cleaning;Laundry    Stability/Clinical Decision Making Stable/Uncomplicated    Rehab Potential Fair    PT Frequency 2x / week    PT Duration 6 weeks    PT Treatment/Interventions ADLs/Self Care Home Management;Cryotherapy;Electrical Stimulation;Iontophoresis 4mg /ml Dexamethasone;Moist Heat;Ultrasound;Neuromuscular re-education;Balance training;Therapeutic exercise;Therapeutic activities;Functional mobility training;Gait training;Passive range of motion;Manual techniques;Vasopneumatic Device    PT Next Visit Plan Nustep, pain free strengthening and ROM,  modalities prn for pain relief    Consulted and Agree with Plan of Care Patient           Patient will benefit from skilled therapeutic intervention in order to improve the following deficits and impairments:  Abnormal gait,Difficulty walking,Decreased range of motion,Decreased activity tolerance,Decreased balance,Decreased strength,Impaired sensation,Pain  Visit Diagnosis: Acute pain of right knee  Difficulty in walking, not elsewhere classified  Localized edema  Muscle weakness  (generalized)     Problem List Patient Active Problem List   Diagnosis Date Noted  . History of cerebral venous sinus thrombosis 11/17/2020  . Pure hypercholesterolemia 11/17/2020  . Family history of breast cancer 05/26/2020  . GAD (generalized anxiety disorder) 03/28/2020  . Depression, major, single episode, moderate ([place]) 03/28/2020  . Bilateral plantar fasciitis 09/03/2019  . OAB (overactive bladder) 09/03/2019  . Cerebral venous sinus thrombosis 01/24/2019  . Dislocation of  jaw 05/15/2018  . TMJ syndrome 05/15/2018  . Asplenia   . Anxiety and depression     APP[place], [place] MPT 12/30/2020, 9:28 AM  [facility] [address] [place], [place], [address] Phone: [phone]   Fax:  [phone]  Name: Cathy Simmons MRN: [id number] Date of Birth: 08/19/1966

## 2020-12-31 ENCOUNTER — Other Ambulatory Visit: Payer: Self-pay | Admitting: Student

## 2020-12-31 DIAGNOSIS — M25561 Pain in right knee: Secondary | ICD-10-CM

## 2021-01-01 ENCOUNTER — Other Ambulatory Visit: Payer: Self-pay

## 2021-01-01 ENCOUNTER — Ambulatory Visit: Payer: BC Managed Care – PPO | Admitting: Physical Therapy

## 2021-01-01 DIAGNOSIS — R600 Localized edema: Secondary | ICD-10-CM | POA: Diagnosis not present

## 2021-01-01 DIAGNOSIS — R262 Difficulty in walking, not elsewhere classified: Secondary | ICD-10-CM | POA: Diagnosis not present

## 2021-01-01 DIAGNOSIS — M25561 Pain in right knee: Secondary | ICD-10-CM

## 2021-01-01 DIAGNOSIS — M6281 Muscle weakness (generalized): Secondary | ICD-10-CM

## 2021-01-01 NOTE — Therapy (Signed)
[facility] [place], [place], [address] Phone: [phone]   Fax:  [phone]  Physical Therapy Treatment  Patient Details  Name: Cathy Simmons MRN: [id number] Date of Birth: [birth year] Referring Provider (PT): Patrecia Pace, PA-C   Encounter Date: 01/01/2021   PT End of Session - 01/01/21 1042    Visit Number 9    Number of Visits 12    Date for PT Re-Evaluation 01/19/21    Authorization Type BCBS, Progress note every 10th visit    PT Start Time 1037    PT Stop Time 1118    PT Time Calculation (min) 41 min    Activity Tolerance Patient limited by pain    Behavior During Therapy [facility] for tasks assessed/performed           Past Medical History:  Diagnosis Date  . Anxiety   . Asplenia   . Depression   . Family history of anesthesia complication    mother difficult to wake  . GERD (gastroesophageal reflux disease)   . History of blood clots   . Hyperlipidemia     Past Surgical History:  Procedure Laterality Date  . BREAST BIOPSY Right    needle core biopsy, benign  . BREAST EXCISIONAL BIOPSY Left    2 times left breast, benign both times  . BREAST SURGERY  breat biopsy x2 last 1995  . DILATION AND CURETTAGE OF UTERUS  x2 1984 and 1991  . excision left ganglion cyst Left   . FOREIGN BODY REMOVAL Right 01/29/2013   Procedure: EXCISION FOREIGN BODY RIGHT SMALL FINGER;  Surgeon: Wynonia Sours, MD;  Location: [place];  Service: Orthopedics;  Laterality: Right;  . removal spleen  2000    There were no vitals filed for this visit.   Subjective Assessment - 01/01/21 1039    Subjective COVID-19 screen performed prior to patient entering clinic. Patient arrived with ongoing pain and has MRI scheduled    Pertinent History anxiety, depression, GERD, history of cerebral venous sinus thrombosis, tape allergy    Limitations Standing;Walking;House hold activities    How long can you sit comfortably?  anxiety, depression, GERD, history of cerebral venous sinus thrombosis    How long can you stand comfortably? 3 hrs    How long can you walk comfortably? short periods but requires assistance from cart    Diagnostic tests x-ray : (-) for fractures    Patient Stated Goals return to work and get back to walking program    Currently in Pain? Yes    Pain Score 5     Pain Location Knee    Pain Orientation Right    Pain Descriptors / Indicators Discomfort    Pain Type Acute pain    Pain Onset More than a month ago    Pain Frequency Constant    Aggravating Factors  prolong walking/standing    Pain Relieving Factors at rest                             [facility] Adult PT Treatment/Exercise - 01/01/21 0001      Knee/Hip Exercises: Aerobic   Nustep Level 3 x 15 minutes.      Acupuncturist Location Right knee.    Electrical Stimulation Action IFC    Electrical Stimulation Parameters 80-150hz  x33mn    Electrical Stimulation Goals Pain      Ultrasound  Ultrasound Parameters combo US/ES 21.5w/cm2/50%/13mz x866m    Ultrasound Goals Pain                       PT Long Term Goals - 01/01/21 1040      PT LONG TERM GOAL #1   Title Patient will be independent with HEP and its progression.    Time 6    Period Weeks    Status Partially Met      PT LONG TERM GOAL #2   Title Patient will demonstrate 3 degrees or less of right knee extension AROM to improve gait mechanics.    Time 6    Period Weeks    Status Achieved      PT LONG TERM GOAL #3   Title Patient will ambulate a community distance with no AD and minimal gait deviations and right knee pain less than or equal to 4/10 to access areas of home and return to community ambulation without support.    Baseline 5/10 pain 01/01/21    Time 6    Period Weeks    Status On-going      PT LONG TERM GOAL #4   Title Patient will demonstrate 4/5 or greater right knee MMT in all planes  to improve stability during functional tasks.    Time 6    Period Weeks    Status On-going      PT LONG TERM GOAL #5   Title Patient will negotiate steps with a reciprocal pattern and no railing to safely enter and exit home.    Baseline ongoing difficulty and esp with decending 01/01/21    Time 6    Period Weeks    Status On-going                 Plan - 01/01/21 1046    Clinical Impression Statement Patient arrived with ongoing pain in knee and limited with any prolong walking or activity. Today continued ligh treatment per PT POC. Patient is scheduled for MRI on 01/19/21 per reported. Patient current goals ongoing due to pain limitations. Patient limited with ADL's , stairs and ability to progress with activity.    Personal Factors and Comorbidities Comorbidity 3+;Time since onset of injury/illness/exacerbation    Comorbidities anxiety, depression, GERD, history of cerebral venous sinus thrombosis    Examination-Activity Limitations Bathing;Locomotion Level;Transfers;Hygiene/Grooming;Dressing;Stairs;Sleep    Examination-Participation Restrictions Occupation;Meal Prep;Cleaning;Laundry    Stability/Clinical Decision Making Stable/Uncomplicated    Rehab Potential Fair    PT Frequency 2x / week    PT Duration 6 weeks    PT Treatment/Interventions ADLs/Self Care Home Management;Cryotherapy;Electrical Stimulation;Iontophoresis 75m72ml Dexamethasone;Moist Heat;Ultrasound;Neuromuscular re-education;Balance training;Therapeutic exercise;Therapeutic activities;Functional mobility training;Gait training;Passive range of motion;Manual techniques;Vasopneumatic Device    PT Next Visit Plan Nustep, pain free strengthening and ROM,  modalities prn for pain relief    Consulted and Agree with Plan of Care Patient           Patient will benefit from skilled therapeutic intervention in order to improve the following deficits and impairments:  Abnormal gait,Difficulty walking,Decreased range of  motion,Decreased activity tolerance,Decreased balance,Decreased strength,Impaired sensation,Pain  Visit Diagnosis: Acute pain of right knee  Difficulty in walking, not elsewhere classified  Localized edema  Muscle weakness (generalized)     Problem List Patient Active Problem List   Diagnosis Date Noted  . History of cerebral venous sinus thrombosis 11/17/2020  . Pure hypercholesterolemia 11/17/2020  . Family history of breast cancer 05/26/2020  . GAD (generalized anxiety disorder)  03/28/2020  . Depression, major, single episode, moderate ([place]) 03/28/2020  . Bilateral plantar fasciitis 09/03/2019  . OAB (overactive bladder) 09/03/2019  . Cerebral venous sinus thrombosis 01/24/2019  . Dislocation of jaw 05/15/2018  . TMJ syndrome 05/15/2018  . Asplenia   . Anxiety and depression     Nathaly Dawkins P, PTA 01/01/2021, 11:24 AM  [facility] [place], [place], [address] Phone: [phone]   Fax:  [phone]  Name: JULENE RAHN MRN: [id number] Date of Birth: 1966/06/15

## 2021-01-08 ENCOUNTER — Other Ambulatory Visit: Payer: Self-pay

## 2021-01-08 ENCOUNTER — Ambulatory Visit: Payer: BC Managed Care – PPO | Admitting: Physical Therapy

## 2021-01-08 DIAGNOSIS — M6281 Muscle weakness (generalized): Secondary | ICD-10-CM

## 2021-01-08 DIAGNOSIS — R262 Difficulty in walking, not elsewhere classified: Secondary | ICD-10-CM | POA: Diagnosis not present

## 2021-01-08 DIAGNOSIS — M25561 Pain in right knee: Secondary | ICD-10-CM | POA: Diagnosis not present

## 2021-01-08 DIAGNOSIS — R600 Localized edema: Secondary | ICD-10-CM

## 2021-01-08 NOTE — Therapy (Signed)
[facility] [place], [place], [address] Phone: [phone]   Fax:  [phone]  Physical Therapy Treatment  Progress Note Reporting Period 12/01/2020 to 01/08/2021  See note below for Objective Data and Assessment of Progress/Goals. Patient with slow improvements with knee. Will have an upcoming MRI.      Patient Details  Name: Cathy Simmons MRN: [id number] Date of Birth: [birth year] Referring Provider (PT): Patrecia Pace, PA-C   Encounter Date: 01/08/2021   PT End of Session - 01/08/21 1040    Visit Number 10    Number of Visits 12    Date for PT Re-Evaluation 01/19/21    Authorization Type BCBS, Progress note every 10th visit    PT Start Time 1036    PT Stop Time 1115    PT Time Calculation (min) 39 min    Activity Tolerance Patient limited by pain    Behavior During Therapy [facility] for tasks assessed/performed           Past Medical History:  Diagnosis Date  . Anxiety   . Asplenia   . Depression   . Family history of anesthesia complication    mother difficult to wake  . GERD (gastroesophageal reflux disease)   . History of blood clots   . Hyperlipidemia     Past Surgical History:  Procedure Laterality Date  . BREAST BIOPSY Right    needle core biopsy, benign  . BREAST EXCISIONAL BIOPSY Left    2 times left breast, benign both times  . BREAST SURGERY  breat biopsy x2 last 1995  . DILATION AND CURETTAGE OF UTERUS  x2 1984 and 1991  . excision left ganglion cyst Left   . FOREIGN BODY REMOVAL Right 01/29/2013   Procedure: EXCISION FOREIGN BODY RIGHT SMALL FINGER;  Surgeon: Wynonia Sours, MD;  Location: [place];  Service: Orthopedics;  Laterality: Right;  . removal spleen  2000    There were no vitals filed for this visit.   Subjective Assessment - 01/08/21 1039    Subjective COVID-19 screen performed prior to patient entering clinic. Patient reported ongoing pain.    Pertinent History  anxiety, depression, GERD, history of cerebral venous sinus thrombosis, tape allergy    Limitations Standing;Walking;House hold activities    How long can you sit comfortably? anxiety, depression, GERD, history of cerebral venous sinus thrombosis    How long can you stand comfortably? 3 hrs    How long can you walk comfortably? short periods but requires assistance from cart    Diagnostic tests x-ray : (-) for fractures    Patient Stated Goals return to work and get back to walking program    Currently in Pain? Yes    Pain Score 4     Pain Location Knee    Pain Orientation Right    Pain Descriptors / Indicators Discomfort    Pain Type Acute pain    Pain Onset More than a month ago    Pain Frequency Constant    Aggravating Factors  prolong walking/standing    Pain Relieving Factors rest                             [facility] Adult PT Treatment/Exercise - 01/08/21 0001      Knee/Hip Exercises: Aerobic   Nustep Level 3 x 15 minutes.      Acupuncturist Location Right knee.  Electrical Stimulation Action IFC    Electrical Stimulation Parameters 80-150hz  x39mn    Electrical Stimulation Goals Pain      Ultrasound   Ultrasound Location Right distal ITB    Ultrasound Parameters combo US/ES @ 1.5w/cm2/50%/1.5761m x 61m61m   Ultrasound Goals Pain                       PT Long Term Goals - 01/08/21 1042      PT LONG TERM GOAL #1   Title Patient will be independent with HEP and its progression.    Baseline limited with exercises due to pain 01/08/21    Time 6    Period Weeks    Status Partially Met      PT LONG TERM GOAL #2   Title Patient will demonstrate 3 degrees or less of right knee extension AROM to improve gait mechanics.    Time 6    Period Weeks    Status Achieved      PT LONG TERM GOAL #3   Title Patient will ambulate a community distance with no AD and minimal gait deviations and right knee pain less than  or equal to 4/10 to access areas of home and return to community ambulation without support.    Baseline unable to walk a comminity distance due to pain 01/08/21    Time 6    Period Weeks    Status On-going      PT LONG TERM GOAL #4   Title Patient will demonstrate 4/5 or greater right knee MMT in all planes to improve stability during functional tasks.    Time 6    Period Weeks    Status On-going      PT LONG TERM GOAL #5   Title Patient will negotiate steps with a reciprocal pattern and no railing to safely enter and exit home.    Baseline patient has ongoing difficulty and esp with decending 01/08/21    Time 6    Period Weeks    Status On-going                 Plan - 01/08/21 1043    Clinical Impression Statement Patient tolerated treatment fair due to ongoing pain esp with activity. Patient 4/10 at rest and increases with any standing or walking. Patient limited with ADL's, stairs and walking due to pain. Patient awaiting her MRI appt. Patient limited with HEP and exercises. No further goals met due to pain deficts.    Personal Factors and Comorbidities Comorbidity 3+;Time since onset of injury/illness/exacerbation    Comorbidities anxiety, depression, GERD, history of cerebral venous sinus thrombosis    Examination-Activity Limitations Bathing;Locomotion Level;Transfers;Hygiene/Grooming;Dressing;Stairs;Sleep    Examination-Participation Restrictions Occupation;Meal Prep;Cleaning;Laundry    Stability/Clinical Decision Making Stable/Uncomplicated    Rehab Potential Fair    PT Frequency 2x / week    PT Duration 6 weeks    PT Treatment/Interventions ADLs/Self Care Home Management;Cryotherapy;Electrical Stimulation;Iontophoresis 4mg44m Dexamethasone;Moist Heat;Ultrasound;Neuromuscular re-education;Balance training;Therapeutic exercise;Therapeutic activities;Functional mobility training;Gait training;Passive range of motion;Manual techniques;Vasopneumatic Device    PT Next Visit  Plan Nustep, pain free strengthening and ROM,  modalities prn for pain relief    Consulted and Agree with Plan of Care Patient           Patient will benefit from skilled therapeutic intervention in order to improve the following deficits and impairments:  Abnormal gait,Difficulty walking,Decreased range of motion,Decreased activity tolerance,Decreased balance,Decreased strength,Impaired sensation,Pain  Visit Diagnosis: Acute pain of right knee  Difficulty in walking, not elsewhere classified  Localized edema  Muscle weakness (generalized)     Problem List Patient Active Problem List   Diagnosis Date Noted  . History of cerebral venous sinus thrombosis 11/17/2020  . Pure hypercholesterolemia 11/17/2020  . Family history of breast cancer 05/26/2020  . GAD (generalized anxiety disorder) 03/28/2020  . Depression, major, single episode, moderate ([place]) 03/28/2020  . Bilateral plantar fasciitis 09/03/2019  . OAB (overactive bladder) 09/03/2019  . Cerebral venous sinus thrombosis 01/24/2019  . Dislocation of jaw 05/15/2018  . TMJ syndrome 05/15/2018  . Asplenia   . Anxiety and depression     Xuan Mateus P, PTA 01/08/2021, 11:15 AM  [facility] [place], [place], [address] Phone: [phone]   Fax:  [phone]  Name: Cathy Simmons MRN: [id number] Date of Birth: 12-04-1965

## 2021-01-13 ENCOUNTER — Ambulatory Visit: Payer: BC Managed Care – PPO | Admitting: Physical Therapy

## 2021-01-13 ENCOUNTER — Encounter: Payer: Self-pay | Admitting: Physical Therapy

## 2021-01-13 ENCOUNTER — Other Ambulatory Visit: Payer: Self-pay

## 2021-01-13 DIAGNOSIS — M25561 Pain in right knee: Secondary | ICD-10-CM

## 2021-01-13 DIAGNOSIS — R262 Difficulty in walking, not elsewhere classified: Secondary | ICD-10-CM | POA: Diagnosis not present

## 2021-01-13 DIAGNOSIS — R600 Localized edema: Secondary | ICD-10-CM | POA: Diagnosis not present

## 2021-01-13 DIAGNOSIS — M6281 Muscle weakness (generalized): Secondary | ICD-10-CM

## 2021-01-13 NOTE — Therapy (Addendum)
[name], [place], [address] Phone: [phone]   Fax:  [phone]  Physical Therapy Treatment PHYSICAL THERAPY DISCHARGE SUMMARY  Visits from Start of Care: 11  Current functional level related to goals / functional outcomes: See below   Remaining deficits: See goals   Education / Equipment: HEP Plan: Patient agrees to discharge.  Patient goals were not met. Patient is being discharged due to not returning since the last visit.  ?????     Patient Details  Name: Cathy Simmons MRN: [id number] Date of Birth: 10-16-1966 Referring Provider (PT): Patrecia Pace, PA-C   Encounter Date: 01/13/2021   PT End of Session - 01/13/21 1045    Visit Number 11    Number of Visits 12    Date for PT Re-Evaluation 01/19/21    Authorization Type [employer], Progress note every 10th visit    PT Start Time 1041    PT Stop Time 1128    PT Time Calculation (min) 47 min    Activity Tolerance Patient limited by pain    Behavior During Therapy [facility] for tasks assessed/performed           Past Medical History:  Diagnosis Date  . Anxiety   . Asplenia   . Depression   . Family history of anesthesia complication    mother difficult to wake  . GERD (gastroesophageal reflux disease)   . History of blood clots   . Hyperlipidemia     Past Surgical History:  Procedure Laterality Date  . BREAST BIOPSY Right    needle core biopsy, benign  . BREAST EXCISIONAL BIOPSY Left    2 times left breast, benign both times  . BREAST SURGERY  breat biopsy x2 last 1995  . DILATION AND CURETTAGE OF UTERUS  x2 1984 and 1991  . excision left ganglion cyst Left   . FOREIGN BODY REMOVAL Right 01/29/2013   Procedure: EXCISION FOREIGN BODY RIGHT SMALL FINGER;  Surgeon: Wynonia Sours, MD;  Location: [facility];  Service: Orthopedics;  Laterality: Right;  . removal spleen  2000    There were no vitals filed for this visit.    Subjective Assessment - 01/13/21 1044    Subjective COVID-19 screen performed prior to patient entering clinic. Patient reported ongoing pain, popping and catching.    Pertinent History anxiety, depression, GERD, history of cerebral venous sinus thrombosis, tape allergy    Limitations Standing;Walking;House hold activities    How long can you sit comfortably? anxiety, depression, GERD, history of cerebral venous sinus thrombosis    How long can you stand comfortably? 3 hrs    How long can you walk comfortably? short periods but requires assistance from cart    Diagnostic tests x-ray : (-) for fractures    Patient Stated Goals return to work and get back to walking program    Currently in Pain? Yes    Pain Score 2     Pain Location Knee    Pain Orientation Right    Pain Descriptors / Indicators Discomfort    Pain Type Acute pain    Pain Onset More than a month ago    Pain Frequency Constant              [facility] PT Assessment - 01/13/21 0001      Assessment   Medical Diagnosis R knee pain    Referring Provider (PT) Patrecia Pace, PA-C    Onset Date/Surgical Date 11/13/20  Next MD Visit After MRI   MRI scheduled for 01/19/2021   Prior Therapy no      Precautions   Precautions None      Restrictions   Weight Bearing Restrictions No                         [facility] Adult PT Treatment/Exercise - 01/13/21 0001      Knee/Hip Exercises: Aerobic   Nustep Level 3 x 15 minutes.      Modalities   Modalities Electrical Stimulation;Ultrasound      Catering manager Action Pre-mod    Electrical Stimulation Parameters 80-150 hz x10 min    Electrical Stimulation Goals Pain      Ultrasound   Ultrasound Location R distal ITB/lateral knee    Ultrasound Parameters Combo 1.5 w/cm, 100%, 1 mhz x10 min    Ultrasound Goals Pain                       PT Long Term Goals - 01/08/21 1042       PT LONG TERM GOAL #1   Title Patient will be independent with HEP and its progression.    Baseline limited with exercises due to pain 01/08/21    Time 6    Period Weeks    Status Partially Met      PT LONG TERM GOAL #2   Title Patient will demonstrate 3 degrees or less of right knee extension AROM to improve gait mechanics.    Time 6    Period Weeks    Status Achieved      PT LONG TERM GOAL #3   Title Patient will ambulate a community distance with no AD and minimal gait deviations and right knee pain less than or equal to 4/10 to access areas of home and return to community ambulation without support.    Baseline unable to walk a comminity distance due to pain 01/08/21    Time 6    Period Weeks    Status On-going      PT LONG TERM GOAL #4   Title Patient will demonstrate 4/5 or greater right knee MMT in all planes to improve stability during functional tasks.    Time 6    Period Weeks    Status On-going      PT LONG TERM GOAL #5   Title Patient will negotiate steps with a reciprocal pattern and no railing to safely enter and exit home.    Baseline patient has ongoing difficulty and esp with decending 01/08/21    Time 6    Period Weeks    Status On-going                 Plan - 01/13/21 1154    Clinical Impression Statement Patient presented in clinic with reports of continued R knee pain, popping/catching. Patient is scheduled for MRI on 01/19/2021. Patient able to tolerate light conservative treatment today with no complaints of pain. Normal modalities response noted following removal of the modalities. Patient to hold off on PT until MRI and MD visit.    Personal Factors and Comorbidities Comorbidity 3+;Time since onset of injury/illness/exacerbation    Comorbidities anxiety, depression, GERD, history of cerebral venous sinus thrombosis    Examination-Activity Limitations Bathing;Locomotion Level;Transfers;Hygiene/Grooming;Dressing;Stairs;Sleep     Examination-Participation Restrictions Occupation;Meal Prep;Cleaning;Laundry    Stability/Clinical Decision Making Stable/Uncomplicated  Rehab Potential Fair    PT Frequency 2x / week    PT Duration 6 weeks    PT Treatment/Interventions ADLs/Self Care Home Management;Cryotherapy;Electrical Stimulation;Iontophoresis 1m/ml Dexamethasone;Moist Heat;Ultrasound;Neuromuscular re-education;Balance training;Therapeutic exercise;Therapeutic activities;Functional mobility training;Gait training;Passive range of motion;Manual techniques;Vasopneumatic Device    PT Next Visit Plan Nustep, pain free strengthening and ROM,  modalities prn for pain relief    PT Home Exercise Plan see patient education section    Consulted and Agree with Plan of Care Patient           Patient will benefit from skilled therapeutic intervention in order to improve the following deficits and impairments:  Abnormal gait,Difficulty walking,Decreased range of motion,Decreased activity tolerance,Decreased balance,Decreased strength,Impaired sensation,Pain  Visit Diagnosis: Acute pain of right knee  Difficulty in walking, not elsewhere classified  Localized edema  Muscle weakness (generalized)     Problem List Patient Active Problem List   Diagnosis Date Noted  . History of cerebral venous sinus thrombosis 11/17/2020  . Pure hypercholesterolemia 11/17/2020  . Family history of breast cancer 05/26/2020  . GAD (generalized anxiety disorder) 03/28/2020  . Depression, major, single episode, moderate ([facility] 03/28/2020  . Bilateral plantar fasciitis 09/03/2019  . OAB (overactive bladder) 09/03/2019  . Cerebral venous sinus thrombosis 01/24/2019  . Dislocation of jaw 05/15/2018  . TMJ syndrome 05/15/2018  . Asplenia   . Anxiety and depression     KStandley Brooking PTA 01/13/2021, 12:01 PM  [facility] [address]Phone: [phone]  Fax:   [phone] Name: PJESSAMY TOROSYANMRN: [id number] of Birth: 4July 20, 1967

## 2021-01-19 ENCOUNTER — Other Ambulatory Visit: Payer: Self-pay

## 2021-01-19 ENCOUNTER — Ambulatory Visit
Admission: RE | Admit: 2021-01-19 | Discharge: 2021-01-19 | Disposition: A | Discharge status: Home / Self Care | Payer: BC Managed Care – PPO | Source: Ambulatory Visit | Attending: Student | Admitting: Student

## 2021-01-19 DIAGNOSIS — M25561 Pain in right knee: Secondary | ICD-10-CM | POA: Diagnosis not present

## 2021-01-21 ENCOUNTER — Ambulatory Visit: Payer: BC Managed Care – PPO | Admitting: Family Medicine

## 2021-01-21 ENCOUNTER — Encounter: Payer: Self-pay | Admitting: Family Medicine

## 2021-01-21 ENCOUNTER — Other Ambulatory Visit: Payer: Self-pay

## 2021-01-21 VITALS — BP 120/82 | HR 96 | Temp 98.2°F | Ht 62.0 in | Wt 154.1 lb

## 2021-01-21 DIAGNOSIS — F419 Anxiety disorder, unspecified: Secondary | ICD-10-CM | POA: Diagnosis not present

## 2021-01-21 DIAGNOSIS — S83241A Other tear of medial meniscus, current injury, right knee, initial encounter: Secondary | ICD-10-CM | POA: Diagnosis not present

## 2021-01-21 MED ORDER — CLONAZEPAM 0.5 MG PO TABS: 0.5000 mg | ORAL_TABLET | Freq: Two times a day (BID) | ORAL | 3 refills | Status: DC | PRN

## 2021-01-21 NOTE — Progress Notes (Signed)
Chief Complaint  Patient presents with  . Fall    Subjective: Patient is a 55 y.o. female here for f/u knee pain. Here w spouse.   2 months ago, the patient had a fall onto her right knee.  She went to [facility] emergency department and no fractures were found.  She is sent to trauma orthopedic surgeon who reviewed imaging and send her to physical therapy.  Physical therapy took it very easy on her as they were concerned for structural abnormality.  She returned to her orthopedic team and they ordered an MRI showing a posterior medial meniscus tear extending through the mid body. She has been told by her orthopedic trauma team that they could not treat her for this as they only dealt with broken bones.  She lost time at work over the past 2 months and her FMLA was not consistently filled out correctly.  Her husband had an excellent experience with Dr. Magnus Ivan and she is interested in seeing him if possible.  She has lateral knee pain.  She has no issues with catching or locking when she walks.  There is no swelling, redness, or bruising.  Pain is not as severe as it was initially.  She has been quite anxious, needs a refill of her Klonopin.  Past Medical History:  Diagnosis Date  . Anxiety   . Asplenia   . Depression   . Family history of anesthesia complication    mother difficult to wake  . GERD (gastroesophageal reflux disease)   . History of blood clots   . Hyperlipidemia     Objective: BP 120/82 (BP Location: Left Arm, Patient Position: Sitting, Cuff Size: Normal)   Pulse 96   Temp 98.2 F (36.8 C) (Oral)   Ht 5\' 2"  (1.575 m)   Wt 154 lb 2 oz (69.9 kg)   SpO2 97%   BMI 28.19 kg/m  General: Awake, appears stated age MSK: No external deformity, reasonable range of motion both active and passive.  No joint effusion. + Medial joint line tenderness to palpation.  Negative Stines, McMurray's Lachman's, varus/valgus stress, patellar apprehension/grind. Lungs: No accessory muscle  use Psych: Age appropriate judgment and insight, normal affect and mood  Assessment and Plan: Acute medial meniscus tear of right knee, initial encounter - Plan: Ambulatory referral to Orthopedic Surgery  Anxiety - Plan: clonazePAM (KLONOPIN) 0.5 MG tablet  1. Refer ortho. Meniscal rehab provided.  2. Refill Klonopin.  F/u as originally scheduled.  The patient voiced understanding and agreement to the plan.  [place], DO 01/21/21  2:56 PM

## 2021-01-21 NOTE — Patient Instructions (Addendum)
If you do not hear anything about your referral in the next 1-2 weeks, call our office and ask for an update.  Heat (pad or rice pillow in microwave) over affected area, 10-15 minutes twice daily.   Ice/cold pack over area for 10-15 min twice daily.  OK to take Tylenol 1000 mg (2 extra strength tabs) or 975 mg (3 regular strength tabs) every 6 hours as needed.  Stretching and range of motion exercises These exercises warm up your muscles and joints and improve the movement and flexibility of your knee. These exercises also help to relieve pain and stiffness.  Exercise A: Knee flexion, active 1. Lie on your back with both knees straight. If this causes back discomfort, bend your uninjured knee so your foot is flat on the floor. 2. Slowly slide your left / right heel back toward your buttocks until you feel a gentle stretch in the front of your knee or thigh. Stop if you have pain. 3. Hold for3 seconds. 4. Slowly slide your left / right heel back to the starting position. 10 total repetitions. Repeat 2 times. Complete this exercise 3 times a week.   Exercise B: Knee extension, sitting 1. Sit with your left / right heel propped on a chair, a coffee table, or a footstool. Do not have anything under your knee to support it. 2. Allow your leg muscles to relax, letting gravity straighten out your knee. You should feel a stretch behind your left / right knee. 3. If told by your health care provide just above your kneecap. 4. Hold this position for 3 seconds. 5. Repeat for a total of 10 repetitions. Repeat 2 times. Complete this stretch 3 times a week.  Strengthening exercises These exercises build strength and endurance in your knee. Endurance is the ability to use your muscles for a long time, even after they get tired.  Exercise C: Quadriceps, isometric 1. Lie on your back with your left / right leg extended and your other knee bent. Put a rolled towel or small pillow under your right/left  knee if told by your health care provider. 2. Slowly tense the muscles in the front of your left / right thigh by pushing the back of your knee down. You should see your knee cap slide up toward your hip or see increased dimpling just above the knee. 3. For 3 seconds, keep the muscle as tight as you can without increasing your pain. 4. Relax the muscles slowly and completely. Repeat for 10 total repetitions. Repeat 2 times. Complete this exercise 3 times a week. Exercise D: Straight leg raises (quadriceps) 1. Lie on your back with your left / right leg extended and your other knee bent. 2. Tense the muscles in the front of your left / right thigh. You should see your kneecap slide up or see increased dimpling just above the knee. 3. Keep these muscles tight as you raise your leg 4-6 inches (10-15 cm) off the floor. 4. Hold this position for 3 seconds. 5. Keep these muscles tense as you lower your leg. 6. Relax the muscles slowly and completely. Repeat for a total of 10 repetitions. Repeat 2 times. Complete this exercise 3 times a week.  Exercise E: Hamstring curls 1. On the floor or a bed, lie on your abdomen with your legs straight. Put a folded towel or small pillow under your left / right thigh, just above your kneecap. 2. Slowly bend your left / right knee as far as you  can without pain. Keep your hips flat against the floor or bed. 3. Hold this position for 3 seconds. 4. Slowly lower your leg to the starting position. Repeat for a total of 10 repetitions. Repeat 2 times. Complete this exercise 3 times per week.  Stretching exercises These exercises warm up your muscles and joints and improve the movement and flexibility of your knee. These exercises also help to relieve pain and stiffness.  Exercise A: Quadriceps, prone 1. Lie on your abdomen on a firm surface, such as a bed or padded floor. 2. Bend your left / right knee and hold your ankle. If you cannot reach your ankle or pant leg,  loop a belt around your foot and grab the belt instead. 3. Gently pull your heel toward your buttocks. Your knee should not slide out to the side. You should feel a stretch in the front of your thigh and knee. 4. Hold this position for 30 seconds. Repeat 2 times. Complete this stretch 3 times a week.  Exercise B: Hamstring, doorway 1. Lie on your back in front of a doorway with your left / right leg resting against the wall and your other leg flat on the floor in the doorway. There should be a slight bend in your left / right knee. 2. Straighten your left / right knee. You should feel a stretch behind your knee or thigh. If you do not feel that stretch, scoot your buttocks closer to the door. 3. Hold this position for 30 seconds. Repeat 2 times. Complete this stretch 3 times a week.  Strengthening exercises These exercises build strength and endurance in your knee and leg muscles. Endurance is the ability to use your muscles for a long time, even after they get tired.   Exercise D: Wall slides (quadriceps) 1. Lean your back against a smooth wall or door, and walk your feet out 18-24 inches (45-61 cm) from it. 2. Place your feet hip-width apart. 3. Slowly slide down the wall or door until your knees bend 90 degrees. Keep your knees over your heels, not over your toes. Keep your knees in line with your hips. 4. Hold for 2 seconds. 5. Stand up to rest for 60 seconds. Repeat 2 times. Complete this exercise 3 times a week.  Exercise E: Bridge (hip extensors) 1. Lie on your back on a firm surface with your knees bent and your feet flat on the floor. 2. Tighten your buttocks muscles and lift your bottom off the floor until your trunk is level with your thighs. ? Do not arch your back. ? You should feel the muscles working in your buttocks and the back of your thighs. 3. Hold this position for 2 seconds. 4. Slowly lower your hips to the starting position. 5. Let your buttocks muscles relax  completely between repetitions. Repeat 2 times. Complete this exercise 3 times a week.

## 2021-01-26 ENCOUNTER — Encounter: Payer: Self-pay | Admitting: Physician Assistant

## 2021-01-26 ENCOUNTER — Ambulatory Visit: Payer: BC Managed Care – PPO | Admitting: Physician Assistant

## 2021-01-26 VITALS — Ht 62.0 in | Wt 154.0 lb

## 2021-01-26 DIAGNOSIS — S83241A Other tear of medial meniscus, current injury, right knee, initial encounter: Secondary | ICD-10-CM

## 2021-01-26 DIAGNOSIS — M25561 Pain in right knee: Secondary | ICD-10-CM | POA: Diagnosis not present

## 2021-01-26 DIAGNOSIS — S8001XA Contusion of right knee, initial encounter: Secondary | ICD-10-CM | POA: Diagnosis not present

## 2021-01-26 NOTE — Progress Notes (Signed)
Office Visit Note   Patient: ELDA DUNKERSON           Date of Birth: Sep 17, 1966           MRN: [id number] Visit Date: 01/26/2021                Requested by: Sharlene Dory, DO [address] [place],  [place] [address] PCP: Sharlene Dory, DO   Assessment & Plan: Visit Diagnoses:  1. Acute pain of right knee   2. Acute medial meniscus tear of right knee, initial encounter   3. Contusion of right knee, initial encounter     Plan:  Discussed with patient at length the findings on MRI.  Reviewed the images with her.  Due to the fact that she is having no mechanical symptoms in her knee pain is improving recommend no intervention at this time.  Did discuss with her knee friendly exercises.  Discussed reasons that would bring her back to the office to discuss her knee pain.  Questions were encouraged and answered at length.  She is able to return to work as of tomorrow full duties.  Follow-Up Instructions: Return if symptoms worsen or fail to improve.   Orders:  No orders of the defined types were placed in this encounter.  No orders of the defined types were placed in this encounter.     Procedures: No procedures performed   Clinical Data: No additional findings.   Subjective: Chief Complaint  Patient presents with  . Right Knee - Pain, Injury    DOI 11/13/20    HPI Mrs. Essman is a 55 year old female were seen for right knee pain.  Pain started after she fell at work.  She states that she was walking table and her arm caught on a chair causing her to land on her right knee.  This occurred on 11/13/2020.  She had significant pain in the knee was unable to ambulate on the knee due to the fact that it was giving way and locking.  This however is resolved.  She has had no injection no aspiration.  She did try knee brace for a week.  She has had no prior pain in the knee.  No previous surgery in the knee.  Pains anterior lateral aspect of  the knee.  Currently having no mechanical symptoms in the knee.  Reviewed patient's MRI images with her today and this showed a nondisplaced tear of the posterior medial meniscus extending into the body.  Also fissuring of her patellofemoral joint and subchondral marrow signal changes consistent with a contusion.  Medial compartment with some mild chondral thinning.  Review of Systems See HPI otherwise negative  Objective: Vital Signs: Ht 5\' 2"  (1.575 m)   Wt 154 lb (69.9 kg)   BMI 28.17 kg/m   Physical Exam Pulmonary:     Effort: Pulmonary effort is normal.  Neurological:     Mental Status: She is alert and oriented to person, place, and time.  Psychiatric:        Mood and Affect: Mood normal.     Ortho Exam Right knee good range of motion.  Nontender medial lateral joint line no instability valgus varus stressing.  No abnormal warmth erythema or effusion.  Patellofemoral crepitus felt.  McMurray's negative. Specialty Comments:  No specialty comments available.  Imaging: No results found.   PMFS History: Patient Active Problem List   Diagnosis Date Noted  . History of cerebral venous  sinus thrombosis 11/17/2020  . Pure hypercholesterolemia 11/17/2020  . Family history of breast cancer 05/26/2020  . GAD (generalized anxiety disorder) 03/28/2020  . Depression, major, single episode, moderate ([facility]) 03/28/2020  . Bilateral plantar fasciitis 09/03/2019  . OAB (overactive bladder) 09/03/2019  . Cerebral venous sinus thrombosis 01/24/2019  . Dislocation of jaw 05/15/2018  . TMJ syndrome 05/15/2018  . Asplenia   . Anxiety and depression    Past Medical History:  Diagnosis Date  . Anxiety   . Asplenia   . Depression   . Family history of anesthesia complication    mother difficult to wake  . GERD (gastroesophageal reflux disease)   . History of blood clots   . Hyperlipidemia     Family History  Problem Relation Age of Onset  . Stroke Mother   . Cancer Mother         breast cancer  . Breast cancer Mother   . Diabetes Father   . Parkinson's disease Father     Past Surgical History:  Procedure Laterality Date  . BREAST BIOPSY Right    needle core biopsy, benign  . BREAST EXCISIONAL BIOPSY Left    2 times left breast, benign both times  . BREAST SURGERY  breat biopsy x2 last 1995  . DILATION AND CURETTAGE OF UTERUS  x2 1984 and 1991  . excision left ganglion cyst Left   . FOREIGN BODY REMOVAL Right 01/29/2013   Procedure: EXCISION FOREIGN BODY RIGHT SMALL FINGER;  Surgeon: Nicki Reaper, MD;  Location: [facility];  Service: Orthopedics;  Laterality: Right;  . removal spleen  2000   Social History   Occupational History  . Not on file  Tobacco Use  . Smoking status: Former Smoker    Packs/day: 0.50    Quit date: 01/30/1999    Years since quitting: 22.0  . Smokeless tobacco: Never Used  Substance and Sexual Activity  . Alcohol use: No  . Drug use: No  . Sexual activity: Yes

## 2021-04-03 ENCOUNTER — Other Ambulatory Visit: Payer: Self-pay

## 2021-04-03 ENCOUNTER — Ambulatory Visit: Payer: BC Managed Care – PPO | Admitting: Family

## 2021-04-03 ENCOUNTER — Encounter: Payer: Self-pay | Admitting: Family

## 2021-04-03 DIAGNOSIS — J40 Bronchitis, not specified as acute or chronic: Secondary | ICD-10-CM | POA: Insufficient documentation

## 2021-04-03 DIAGNOSIS — L237 Allergic contact dermatitis due to plants, except food: Secondary | ICD-10-CM | POA: Insufficient documentation

## 2021-04-03 MED ORDER — ALBUTEROL SULFATE HFA 108 (90 BASE) MCG/ACT IN AERS: 2.0000 | INHALATION_SPRAY | Freq: Four times a day (QID) | RESPIRATORY_TRACT | 0 refills | Status: DC | PRN

## 2021-04-03 MED ORDER — PROMETHAZINE-DM 6.25-15 MG/5ML PO SYRP: 5.0000 mL | ORAL_SOLUTION | Freq: Four times a day (QID) | ORAL | 0 refills | Status: DC | PRN

## 2021-04-03 MED ORDER — PREDNISONE 10 MG PO TABS: ORAL_TABLET | ORAL | 0 refills | Status: DC

## 2021-04-03 NOTE — Assessment & Plan Note (Signed)
Lung exam is normal, but she reports+ wheezing.  Given her Asplenia, recommended that she complete her cefdinir rx.  In addition I have recommended the following:  Start prednisone taper as ordered. Restart flonase 2 sprays each nostril. Continue zyrtec 10mg .  You may use promethazine DM cough syrup as needed for cough.  Call if you develop new/worsening symptoms or if symptoms do not improve please let know.

## 2021-04-03 NOTE — Patient Instructions (Addendum)
Continue Cefdinir. Start prednisone taper as ordered. Restart flonase 2 sprays each nostril. Continue zyrtec 10mg .  You may use promethazine DM cough syrup as needed for cough.  Call if you develop new/worsening symptoms or if symptoms do not improve please let know.

## 2021-04-03 NOTE — Progress Notes (Signed)
Subjective:   By signing my name below, I, Cathy Simmons, attest that this documentation has been prepared under the direction and in the presence of Sandford Craze NP. 04/03/2021      Patient ID: Cathy Simmons, female    DOB: 11-Dec-1965, 55 y.o.   MRN: [id number]  No chief complaint on file.   HPI Patient is in today for a office visit. She is complaining of nasal congestion, left ear ache, sore throat, diarrhea (Tuesday only then resolved), cough, and wheezing. She is taking 300 mg Cefdinir 2x daily PO and 10 mg zyrtec daily PO to manage her symptoms but has mild relief. Cefdinir was prescribed by urgent care 3 days ago. She reports that she tested negative at that time for flu, strep and covid-19. She is not taking Flonase at this time although it is on her medication list. She notes that she had recently pulled some ticks off of her. She is requesting a refill for 108 mcg albuterol to manage her bronchiospasm. States she "sneezed 14 times in a row today." She is also using robitussin with minimal relief. Cough is described as dry.    Past Medical History:  Diagnosis Date  . Anxiety   . Asplenia   . Depression   . Family history of anesthesia complication    mother difficult to wake  . GERD (gastroesophageal reflux disease)   . History of blood clots   . Hyperlipidemia     Past Surgical History:  Procedure Laterality Date  . BREAST BIOPSY Right    needle core biopsy, benign  . BREAST EXCISIONAL BIOPSY Left    2 times left breast, benign both times  . BREAST SURGERY  breat biopsy x2 last 1995  . DILATION AND CURETTAGE OF UTERUS  x2 1984 and 1991  . excision left ganglion cyst Left   . FOREIGN BODY REMOVAL Right 01/29/2013   Procedure: EXCISION FOREIGN BODY RIGHT SMALL FINGER;  Surgeon: Nicki Reaper, MD;  Location: [facility];  Service: Orthopedics;  Laterality: Right;  . removal spleen  2000    Family History  Problem Relation Age of Onset  . Stroke  Mother   . Cancer Mother        breast cancer  . Breast cancer Mother   . Diabetes Father   . Parkinson's disease Father     Social History   Socioeconomic History  . Marital status: Married    Spouse name: Not on file  . Number of children: Not on file  . Years of education: Not on file  . Highest education level: Not on file  Occupational History  . Not on file  Tobacco Use  . Smoking status: Former Smoker    Packs/day: 0.50    Quit date: 01/30/1999    Years since quitting: 22.1  . Smokeless tobacco: Never Used  Substance and Sexual Activity  . Alcohol use: No  . Drug use: No  . Sexual activity: Yes  Other Topics Concern  . Not on file  Social History Narrative  . Not on file   Social Determinants of Health   Financial Resource Strain: Not on file  Food Insecurity: Not on file  Transportation Needs: Not on file  Physical Activity: Not on file  Stress: Not on file  Social Connections: Not on file  Intimate Partner Violence: Not on file    Outpatient Medications Prior to Visit  Medication Sig Dispense Refill  . albuterol (VENTOLIN HFA) 108 (90  Base) MCG/ACT inhaler Inhale 2 puffs into the lungs every 6 (six) hours as needed for wheezing or shortness of breath.    Marland Kitchen amitriptyline (ELAVIL) 25 MG tablet Take 1 tablet (25 mg total) by mouth at bedtime. 30 tablet 2  . cetirizine (ZYRTEC) 10 MG tablet Take 10 mg by mouth daily.    . Cholecalciferol (VITAMIN D-3) 1000 units CAPS Take 1,000 Units by mouth daily.     . clonazePAM (KLONOPIN) 0.5 MG tablet Take 1 tablet (0.5 mg total) by mouth 2 (two) times daily as needed. for anxiety 30 tablet 3  . esomeprazole (NEXIUM) 20 MG capsule Take 20 mg by mouth daily at 12 noon.    . fluticasone (FLONASE) 50 MCG/ACT nasal spray Place 2 sprays into both nostrils daily. 16 g 6  . rivaroxaban (XARELTO) 20 MG TABS tablet Take 1 tablet (20 mg total) by mouth daily with supper. 90 tablet 3  . rosuvastatin (CRESTOR) 20 MG tablet Take 1  tablet (20 mg total) by mouth daily. 90 tablet 3  . vitamin B-12 (CYANOCOBALAMIN) 1000 MCG tablet Take 500-1,000 mcg by mouth daily.      No facility-administered medications prior to visit.    Allergies  Allergen Reactions  . Bactrim [Sulfamethoxazole-Trimethoprim] Other (See Comments)    Sulfa causes yeast infections!  . Adhesive [Tape] Rash and Other (See Comments)    Band-Aids  . Cymbalta [Duloxetine Hcl] Anxiety    ROS     Objective:    Physical Exam Constitutional:      Appearance: Normal appearance. She is well-developed.  HENT:     Head: Normocephalic and atraumatic.     Right Ear: Tympanic membrane and external ear normal.     Left Ear: External ear normal. Tympanic membrane is retracted. Tympanic membrane is not erythematous.  Eyes:     Extraocular Movements: Extraocular movements intact.     Pupils: Pupils are equal, round, and reactive to light.  Neck:     Thyroid: No thyromegaly.  Cardiovascular:     Rate and Rhythm: Normal rate and regular rhythm.     Pulses: Normal pulses.     Heart sounds: Normal heart sounds. No murmur heard.   Pulmonary:     Effort: Pulmonary effort is normal. No respiratory distress.     Breath sounds: Normal breath sounds. No stridor. No wheezing, rhonchi or rales.  Musculoskeletal:     Cervical back: Neck supple.  Skin:    General: Skin is warm and dry.  Neurological:     Mental Status: She is alert and oriented to person, place, and time.  Psychiatric:        Behavior: Behavior normal.        Thought Content: Thought content normal.        Judgment: Judgment normal.     There were no vitals taken for this visit. Wt Readings from Last 3 Encounters:  01/26/21 154 lb (69.9 kg)  01/21/21 154 lb 2 oz (69.9 kg)  11/17/20 145 lb 4 oz (65.9 kg)    Diabetic Foot Exam - Simple   No data filed    Lab Results  Component Value Date   WBC 11.0 (H) 03/31/2020   HGB 14.2 03/31/2020   HCT 44.5 03/31/2020   PLT 375 03/31/2020    GLUCOSE 84 11/17/2020   CHOL 150 11/17/2020   TRIG 86.0 11/17/2020   HDL 55.10 11/17/2020   LDLCALC 78 11/17/2020   ALT 20 11/17/2020   AST 19 11/17/2020  NA 141 11/17/2020   K 4.3 11/17/2020   CL 106 11/17/2020   CREATININE 0.65 11/17/2020   BUN 13 11/17/2020   CO2 29 11/17/2020   TSH 1.62 03/28/2020    Lab Results  Component Value Date   TSH 1.62 03/28/2020   Lab Results  Component Value Date   WBC 11.0 (H) 03/31/2020   HGB 14.2 03/31/2020   HCT 44.5 03/31/2020   MCV 94.5 03/31/2020   PLT 375 03/31/2020   Lab Results  Component Value Date   NA 141 11/17/2020   K 4.3 11/17/2020   CO2 29 11/17/2020   GLUCOSE 84 11/17/2020   BUN 13 11/17/2020   CREATININE 0.65 11/17/2020   BILITOT 0.5 11/17/2020   ALKPHOS 97 11/17/2020   AST 19 11/17/2020   ALT 20 11/17/2020   PROT 7.2 11/17/2020   ALBUMIN 4.2 11/17/2020   CALCIUM 10.0 11/17/2020   ANIONGAP 10 03/31/2020   GFR 98.96 11/17/2020   Lab Results  Component Value Date   CHOL 150 11/17/2020   Lab Results  Component Value Date   HDL 55.10 11/17/2020   Lab Results  Component Value Date   LDLCALC 78 11/17/2020   Lab Results  Component Value Date   TRIG 86.0 11/17/2020   Lab Results  Component Value Date   CHOLHDL 3 11/17/2020   No results found for: HGBA1C     Assessment & Plan:   Problem List Items Addressed This Visit   None      No orders of the defined types were placed in this encounter.   I, Sandford Craze NP, personally preformed the services described in this documentation.  All medical record entries made by the scribe were at my direction and in my presence.  I have reviewed the chart and discharge instructions (if applicable) and agree that the record reflects my personal performance and is accurate and complete. 04/03/2021    I,Cathy Simmons,acting as a Neurosurgeon for Lemont Fillers, NP.,have documented all relevant documentation on the behalf of Lemont Fillers, NP,as  directed by  Lemont Fillers, NP while in the presence of Lemont Fillers, NP.   Cathy [employer]

## 2021-04-06 ENCOUNTER — Telehealth: Payer: Self-pay

## 2021-04-06 NOTE — Telephone Encounter (Signed)
Called the patient and she was seen by Sandford Craze on Friday 04/03/2021.  She is feeling much better today and does not need an appointment.

## 2021-04-06 NOTE — Telephone Encounter (Signed)
Caller states she is calling for an appointment. She was seen in urgent care on Wednesday and she feels worse now.  Telephone: [phone]

## 2021-04-28 ENCOUNTER — Other Ambulatory Visit: Payer: Self-pay | Admitting: Family Medicine

## 2021-04-28 DIAGNOSIS — E7800 Pure hypercholesterolemia, unspecified: Secondary | ICD-10-CM

## 2021-04-28 MED ORDER — ROSUVASTATIN CALCIUM 20 MG PO TABS: 20.0000 mg | ORAL_TABLET | Freq: Every day | ORAL | 3 refills | Status: DC

## 2021-05-25 ENCOUNTER — Encounter: Payer: Self-pay | Admitting: Family Medicine

## 2021-05-25 ENCOUNTER — Other Ambulatory Visit: Payer: Self-pay

## 2021-05-25 ENCOUNTER — Ambulatory Visit (INDEPENDENT_AMBULATORY_CARE_PROVIDER_SITE_OTHER): Payer: BC Managed Care – PPO | Admitting: Family Medicine

## 2021-05-25 VITALS — BP 120/78 | HR 77 | Temp 98.1°F | Ht 62.0 in | Wt 147.0 lb

## 2021-05-25 DIAGNOSIS — Z1211 Encounter for screening for malignant neoplasm of colon: Secondary | ICD-10-CM

## 2021-05-25 DIAGNOSIS — Z0000 Encounter for general adult medical examination without abnormal findings: Secondary | ICD-10-CM | POA: Diagnosis not present

## 2021-05-25 DIAGNOSIS — Z1159 Encounter for screening for other viral diseases: Secondary | ICD-10-CM | POA: Diagnosis not present

## 2021-05-25 DIAGNOSIS — F321 Major depressive disorder, single episode, moderate: Secondary | ICD-10-CM | POA: Diagnosis not present

## 2021-05-25 LAB — COMPREHENSIVE METABOLIC PANEL
ALT: 14 U/L (ref 0–35)
AST: 17 U/L (ref 0–37)
Albumin: 4.2 g/dL (ref 3.5–5.2)
Alkaline Phosphatase: 85 U/L (ref 39–117)
BUN: 13 mg/dL (ref 6–23)
CO2: 29 mEq/L (ref 19–32)
Calcium: 9.8 mg/dL (ref 8.4–10.5)
Chloride: 104 mEq/L (ref 96–112)
Creatinine, Ser: 0.61 mg/dL (ref 0.40–1.20)
GFR: 100.13 mL/min (ref >60.00)
Glucose, Bld: 93 mg/dL (ref 70–99)
Potassium: 3.9 mEq/L (ref 3.5–5.1)
Sodium: 141 mEq/L (ref 135–145)
Total Bilirubin: 0.4 mg/dL (ref 0.2–1.2)
Total Protein: 6.9 g/dL (ref 6.0–8.3)

## 2021-05-25 LAB — CBC
HCT: 40.4 % (ref 36.0–46.0)
Hemoglobin: 13.7 g/dL (ref 12.0–15.0)
MCHC: 34 g/dL (ref 30.0–36.0)
MCV: 92.3 fl (ref 78.0–100.0)
Platelets: 245 10*3/uL (ref 150.0–400.0)
RBC: 4.38 Mil/uL (ref 3.87–5.11)
RDW: 13.9 % (ref 11.5–15.5)
WBC: 9 10*3/uL (ref 4.0–10.5)

## 2021-05-25 LAB — LIPID PANEL
Cholesterol: 133 mg/dL (ref 0–200)
HDL: 47.7 mg/dL (ref >39.00)
LDL Cholesterol: 65 mg/dL (ref 0–99)
NonHDL: 85.37
Total CHOL/HDL Ratio: 3
Triglycerides: 100 mg/dL (ref 0.0–149.0)
VLDL: 20 mg/dL (ref 0.0–40.0)

## 2021-05-25 MED ORDER — RIVAROXABAN 20 MG PO TABS: 20.0000 mg | ORAL_TABLET | Freq: Every day | ORAL | 3 refills | Status: DC

## 2021-05-25 NOTE — Patient Instructions (Addendum)
Give [place] 2-3 business days to get the results of your labs back.   Keep the diet clean and stay active.  The new Shingrix vaccine (for shingles) is a 2 shot series. It can make people feel low energy, achy and almost like they have the flu for 48 hours after injection. Please plan accordingly when deciding on when to get this shot. Call our office for a nurse visit appointment to get this. The second shot of the series is less severe regarding the side effects, but it still lasts 48 hours.   If you do not hear anything about your referral in the next 1-2 weeks, call our office and ask for an update.  I recommend getting the 2nd covid vaccination booster early September since you just had Covid.   Let us know if you need anything.

## 2021-05-25 NOTE — Progress Notes (Signed)
Chief Complaint  Patient presents with   Annual Exam     Well Woman Cathy Simmons is here for a complete physical.   Her last physical was >1 year ago.  Current diet: in general, a "healthy" diet. Current exercise: active in yard, walking. Weight is down a little and she denies fatigue out of ordinary. Seatbelt? Yes  Health Maintenance Pap/HPV- Yes Mammogram- Yes Colon cancer screening-No Shingrix- No Tetanus- Yes Hep C screening- No HIV screening- Yes  Past Medical History:  Diagnosis Date   Anxiety    Asplenia    Depression    Family history of anesthesia complication    mother difficult to wake   GERD (gastroesophageal reflux disease)    History of blood clots    Hyperlipidemia      Past Surgical History:  Procedure Laterality Date   BREAST BIOPSY Right    needle core biopsy, benign   BREAST EXCISIONAL BIOPSY Left    2 times left breast, benign both times   BREAST SURGERY  breat biopsy x2 last 1995   DILATION AND CURETTAGE OF UTERUS  x2 1984 and 1991   excision left ganglion cyst Left    FOREIGN BODY REMOVAL Right 01/29/2013   Procedure: EXCISION FOREIGN BODY RIGHT SMALL FINGER;  Surgeon: Nicki Reaper, MD;  Location: [facility];  Service: Orthopedics;  Laterality: Right;   removal spleen  2000    Medications  Current Outpatient Medications on File Prior to Visit  Medication Sig Dispense Refill   albuterol (VENTOLIN HFA) 108 (90 Base) MCG/ACT inhaler Inhale 2 puffs into the lungs every 6 (six) hours as needed for wheezing or shortness of breath. 6.7 g 0   amitriptyline (ELAVIL) 25 MG tablet Take 1 tablet (25 mg total) by mouth at bedtime. 30 tablet 2   cefdinir (OMNICEF) 300 MG capsule Take 300 mg by mouth 2 (two) times daily.     cetirizine (ZYRTEC) 10 MG tablet Take 10 mg by mouth daily.     Cholecalciferol (VITAMIN D-3) 1000 units CAPS Take 1,000 Units by mouth daily.      clonazePAM (KLONOPIN) 0.5 MG tablet Take 1 tablet (0.5 mg total) by  mouth 2 (two) times daily as needed. for anxiety 30 tablet 3   esomeprazole (NEXIUM) 20 MG capsule Take 20 mg by mouth daily at 12 noon.     fluticasone (FLONASE) 50 MCG/ACT nasal spray Place 2 sprays into both nostrils daily. 16 g 6   predniSONE (DELTASONE) 10 MG tablet 4 tabs by mouth once daily for 2 days, then 3 tabs daily x 2 days, then 2 tabs daily x 2 days, then 1 tab daily x 2 days 20 tablet 0   promethazine-dextromethorphan (PROMETHAZINE-DM) 6.25-15 MG/5ML syrup Take 5 mLs by mouth 4 (four) times daily as needed for cough. 200 mL 0   rivaroxaban (XARELTO) 20 MG TABS tablet Take 1 tablet (20 mg total) by mouth daily with supper. 90 tablet 3   rosuvastatin (CRESTOR) 20 MG tablet Take 1 tablet (20 mg total) by mouth daily. 90 tablet 3   vitamin B-12 (CYANOCOBALAMIN) 1000 MCG tablet Take 500-1,000 mcg by mouth daily.       Allergies Allergies  Allergen Reactions   Bactrim [Sulfamethoxazole-Trimethoprim] Other (See Comments)    Sulfa causes yeast infections!   Adhesive [Tape] Rash and Other (See Comments)    Band-Aids   Cymbalta [Duloxetine Hcl] Anxiety    Review of Systems: Constitutional:  no unexpected weight changes Eye:  no recent significant change in vision Ear/Nose/Mouth/Throat:  Ears:  no recent change in hearing Nose/Mouth/Throat:  no complaints of nasal congestion, no sore throat Cardiovascular: no chest pain Respiratory:  no shortness of breath Gastrointestinal:  no abdominal pain, no change in bowel habits GU:  Female: negative for dysuria or pelvic pain Musculoskeletal/Extremities:  no pain of the joints Integumentary (Skin/Breast):  no abnormal skin lesions reported Neurologic:  no headaches Endocrine:  denies fatigue  Exam BP 120/78   Pulse 77   Temp 98.1 F (36.7 C) (Oral)   Ht 5\' 2"  (1.575 m)   Wt 147 lb (66.7 kg)   SpO2 97%   BMI 26.89 kg/m  General:  well developed, well nourished, in no apparent distress Skin:  no significant moles, warts, or  growths Head:  no masses, lesions, or tenderness Eyes:  pupils equal and round, sclera anicteric without injection Ears:  canals without lesions, TMs shiny without retraction, no obvious effusion, no erythema Nose:  nares patent, septum midline, mucosa normal, and no drainage or sinus tenderness Throat/Pharynx:  lips and gingiva without lesion; tongue and uvula midline; non-inflamed pharynx; no exudates or postnasal drainage Neck: neck supple without adenopathy, thyromegaly, or masses Lungs:  clear to auscultation, breath sounds equal bilaterally, no respiratory distress Cardio:  regular rate and rhythm, no LE edema Abdomen:  abdomen soft, nontender; bowel sounds normal; no masses or organomegaly Genital: Defer to GYN Musculoskeletal:  symmetrical muscle groups noted without atrophy or deformity Extremities:  no clubbing, cyanosis, or edema, no deformities, no skin discoloration Neuro:  gait normal; deep tendon reflexes normal and symmetric Psych: well oriented with normal range of affect and appropriate judgment/insight  Assessment and Plan  Well adult exam - Plan: CBC, Comprehensive metabolic panel, Lipid panel  Depression, major, single episode, moderate (HCC)  Encounter for hepatitis C screening test for low risk patient - Plan: Hepatitis C antibody  Screen for colon cancer - Plan: Ambulatory referral to Gastroenterology   Well 55 y.o. female. Counseled on diet and exercise. Screen Hep C. Refer GI for CCS. Shingrix recd.  2nd covid booster rec'd after 90 d of infection.  Other orders as above. Follow up in 6 mo or prn. The patient voiced understanding and agreement to the plan.  [address] [place], DO 05/25/21 7:44 AM

## 2021-05-26 LAB — HEPATITIS C ANTIBODY
Hepatitis C Ab: NONREACTIVE
SIGNAL TO CUT-OFF: 0.01 (ref <1.00)

## 2021-06-22 ENCOUNTER — Ambulatory Visit: Payer: BC Managed Care – PPO | Admitting: Family Medicine

## 2021-06-22 ENCOUNTER — Other Ambulatory Visit: Payer: Self-pay

## 2021-06-22 ENCOUNTER — Encounter: Payer: Self-pay | Admitting: Family Medicine

## 2021-06-22 VITALS — BP 108/68 | HR 97 | Temp 98.2°F | Ht 62.0 in | Wt 146.1 lb

## 2021-06-22 DIAGNOSIS — K529 Noninfective gastroenteritis and colitis, unspecified: Secondary | ICD-10-CM | POA: Diagnosis not present

## 2021-06-22 DIAGNOSIS — R197 Diarrhea, unspecified: Secondary | ICD-10-CM

## 2021-06-22 DIAGNOSIS — R509 Fever, unspecified: Secondary | ICD-10-CM

## 2021-06-22 DIAGNOSIS — R52 Pain, unspecified: Secondary | ICD-10-CM | POA: Diagnosis not present

## 2021-06-22 MED ORDER — DICYCLOMINE HCL 10 MG PO CAPS: ORAL_CAPSULE | ORAL | 0 refills | Status: DC

## 2021-06-22 MED ORDER — ONDANSETRON 4 MG PO TBDP
4.0000 mg | ORAL_TABLET | Freq: Three times a day (TID) | ORAL | 0 refills | Status: DC | PRN
Start: 2021-06-22 — End: 2021-09-08

## 2021-06-22 MED ORDER — METHYLPREDNISOLONE ACETATE 80 MG/ML IJ SUSP
80.0000 mg | Freq: Once | INTRAMUSCULAR | Status: AC
  Administered 2021-06-22: 80 mg via INTRAMUSCULAR

## 2021-06-22 NOTE — Progress Notes (Signed)
Chief Complaint  Patient presents with   Diarrhea   Generalized Body Aches   Fever    Negative Covid test      Subjective Cathy Simmons is a 55 y.o. female who presents with nausea and diarrhea. Here w spouse.  Symptoms began 2 d ago.  Patient has nausea, abd pain, low grade fevers, slight cough, sinus pressure, diarrhea.  Patient denies vomiting, mucous, bleeding, recent abx use, med changes, new foods and URI symptoms Evaluation to date: no Sick contacts: none known; some ppl at work fake s/s's so tough to tell.   Past Medical History:  Diagnosis Date   Anxiety    Asplenia    Depression    Family history of anesthesia complication    mother difficult to wake   GERD (gastroesophageal reflux disease)    History of blood clots    Hyperlipidemia    Past Surgical History:  Procedure Laterality Date   BREAST BIOPSY Right    needle core biopsy, benign   BREAST EXCISIONAL BIOPSY Left    2 times left breast, benign both times   BREAST SURGERY  breat biopsy x2 last 1995   DILATION AND CURETTAGE OF UTERUS  x2 1984 and 1991   excision left ganglion cyst Left    FOREIGN BODY REMOVAL Right 01/29/2013   Procedure: EXCISION FOREIGN BODY RIGHT SMALL FINGER;  Surgeon: Nicki Reaper, MD;  Location: [facility];  Service: Orthopedics;  Laterality: Right;   removal spleen  2000   Exam BP 108/68   Pulse 97   Temp 98.2 F (36.8 C) (Oral)   Ht 5\' 2"  (1.575 m)   Wt 146 lb 2 oz (66.3 kg)   SpO2 98%   BMI 26.73 kg/m  General:  well developed, well hydrated, in no apparent distress Skin:  warm, no pallor or diaphoresis, no rashes Throat/Pharynx:  lips and gingiva without lesion; tongue and uvula midline; non-inflamed pharynx; no exudates or postnasal drainage Neck: neck supple without adenopathy, thyromegaly, or masses Lungs:  clear to auscultation, breath sounds equal bilaterally, no respiratory distress, no wheezes Cardio:  regular rate and rhythm without  murmurs Abdomen:  abdomen soft, nontender; bowel sounds normal; no masses or organomegaly Extremities:  no clubbing, cyanosis, or edema Psych: Appropriate judgement/insight  Assessment and Plan  Diarrhea, unspecified type - Plan: Novel Coronavirus, NAA ([facility])  Fever, unspecified fever cause - Plan: Novel Coronavirus, NAA ([facility])  Body aches - Plan: Novel Coronavirus, NAA ([facility])  Benign abd. Infectious, question covid, ck PCR test. Bentyl and Zofran prn. Push fluids. Avoid aggravating foods, discussed BRAT diet. Letter for work given.  F/u if symptoms fail to improve, sooner if worsening. The patient voiced understanding and agreement to the plan.  [place], DO 06/22/21  11:21 AM

## 2021-06-22 NOTE — Patient Instructions (Addendum)
Stay hydrated. Drink something with electrolytes while having diarrhea. Examples include Powerade, Gatorade or Pedialyte.   Give [place] 24-48 hours to get the results to you.  OK to take Tylenol 1000 mg (2 extra strength tabs) or 975 mg (3 regular strength tabs) every 6 hours as needed.  Let us know if you need anything.

## 2021-06-22 NOTE — Addendum Note (Signed)
Addended by: Scharlene Gloss B on: 06/22/2021 11:43 AM   Modules accepted: Orders

## 2021-06-24 LAB — SARS-COV-2, NAA 2 DAY TAT

## 2021-06-24 LAB — NOVEL CORONAVIRUS, NAA: SARS-CoV-2, NAA: NOT DETECTED

## 2021-07-07 ENCOUNTER — Emergency Department (HOSPITAL_BASED_OUTPATIENT_CLINIC_OR_DEPARTMENT_OTHER): Payer: BC Managed Care – PPO

## 2021-07-07 ENCOUNTER — Emergency Department (HOSPITAL_BASED_OUTPATIENT_CLINIC_OR_DEPARTMENT_OTHER)
Admission: EM | Admit: 2021-07-07 | Discharge: 2021-07-07 | Disposition: A | Discharge status: Home / Self Care | Payer: BC Managed Care – PPO | Attending: Emergency Medicine | Admitting: Emergency Medicine

## 2021-07-07 ENCOUNTER — Other Ambulatory Visit: Payer: Self-pay

## 2021-07-07 ENCOUNTER — Encounter (HOSPITAL_BASED_OUTPATIENT_CLINIC_OR_DEPARTMENT_OTHER): Payer: Self-pay | Admitting: *Deleted

## 2021-07-07 DIAGNOSIS — K5792 Diverticulitis of intestine, part unspecified, without perforation or abscess without bleeding: Secondary | ICD-10-CM | POA: Insufficient documentation

## 2021-07-07 DIAGNOSIS — R1010 Upper abdominal pain, unspecified: Secondary | ICD-10-CM | POA: Diagnosis not present

## 2021-07-07 DIAGNOSIS — Z87891 Personal history of nicotine dependence: Secondary | ICD-10-CM | POA: Insufficient documentation

## 2021-07-07 DIAGNOSIS — R109 Unspecified abdominal pain: Secondary | ICD-10-CM | POA: Diagnosis not present

## 2021-07-07 LAB — COMPREHENSIVE METABOLIC PANEL
ALT: 15 U/L (ref 0–44)
AST: 18 U/L (ref 15–41)
Albumin: 4.2 g/dL (ref 3.5–5.0)
Alkaline Phosphatase: 88 U/L (ref 38–126)
Anion gap: 9 (ref 5–15)
BUN: 12 mg/dL (ref 6–20)
CO2: 27 mmol/L (ref 22–32)
Calcium: 9.6 mg/dL (ref 8.9–10.3)
Chloride: 103 mmol/L (ref 98–111)
Creatinine, Ser: 0.58 mg/dL (ref 0.44–1.00)
GFR, Estimated: >60 mL/min (ref >60)
Glucose, Bld: 94 mg/dL (ref 70–99)
Potassium: 3.7 mmol/L (ref 3.5–5.1)
Sodium: 139 mmol/L (ref 135–145)
Total Bilirubin: 0.3 mg/dL (ref 0.3–1.2)
Total Protein: 7.7 g/dL (ref 6.5–8.1)

## 2021-07-07 LAB — URINALYSIS, MICROSCOPIC (REFLEX): WBC, UA: NONE SEEN WBC/hpf (ref 0–5)

## 2021-07-07 LAB — CBC
HCT: 41.5 % (ref 36.0–46.0)
Hemoglobin: 14 g/dL (ref 12.0–15.0)
MCH: 30.8 pg (ref 26.0–34.0)
MCHC: 33.7 g/dL (ref 30.0–36.0)
MCV: 91.4 fL (ref 80.0–100.0)
Platelets: 343 10*3/uL (ref 150–400)
RBC: 4.54 MIL/uL (ref 3.87–5.11)
RDW: 13.6 % (ref 11.5–15.5)
WBC: 13.8 10*3/uL — ABNORMAL HIGH (ref 4.0–10.5)
nRBC: 0 % (ref 0.0–0.2)

## 2021-07-07 LAB — URINALYSIS, ROUTINE W REFLEX MICROSCOPIC
Bilirubin Urine: NEGATIVE
Glucose, UA: NEGATIVE mg/dL
Ketones, ur: NEGATIVE mg/dL
Leukocytes,Ua: NEGATIVE
Nitrite: NEGATIVE
Protein, ur: NEGATIVE mg/dL
Specific Gravity, Urine: <1.005 — ABNORMAL LOW (ref 1.005–1.030)
pH: 6 (ref 5.0–8.0)

## 2021-07-07 LAB — LIPASE, BLOOD: Lipase: 20 U/L (ref 11–51)

## 2021-07-07 MED ORDER — HYDROCODONE-ACETAMINOPHEN 5-325 MG PO TABS: 1.0000 | ORAL_TABLET | ORAL | 0 refills | Status: DC | PRN

## 2021-07-07 MED ORDER — SODIUM CHLORIDE 0.9 % IV BOLUS
1000.0000 mL | Freq: Once | INTRAVENOUS | Status: AC
  Administered 2021-07-07: 1000 mL via INTRAVENOUS

## 2021-07-07 MED ORDER — AMOXICILLIN-POT CLAVULANATE 875-125 MG PO TABS: 1.0000 | ORAL_TABLET | Freq: Two times a day (BID) | ORAL | 0 refills | Status: DC

## 2021-07-07 MED ORDER — MORPHINE SULFATE (PF) 4 MG/ML IV SOLN
4.0000 mg | Freq: Once | INTRAVENOUS | Status: AC
  Administered 2021-07-07: 4 mg via INTRAVENOUS
  Filled 2021-07-07: qty 1

## 2021-07-07 MED ORDER — ONDANSETRON HCL 4 MG/2ML IJ SOLN
4.0000 mg | Freq: Once | INTRAMUSCULAR | Status: AC
  Administered 2021-07-07: 4 mg via INTRAVENOUS
  Filled 2021-07-07: qty 2

## 2021-07-07 MED ORDER — AMOXICILLIN-POT CLAVULANATE 875-125 MG PO TABS
1.0000 | ORAL_TABLET | Freq: Once | ORAL | Status: AC
  Administered 2021-07-07: 1 via ORAL
  Filled 2021-07-07: qty 1

## 2021-07-07 MED ORDER — ONDANSETRON 4 MG PO TBDP: 4.0000 mg | ORAL_TABLET | Freq: Three times a day (TID) | ORAL | 0 refills | Status: DC | PRN

## 2021-07-07 MED ORDER — IOHEXOL 300 MG/ML  SOLN
100.0000 mL | Freq: Once | INTRAMUSCULAR | Status: AC | PRN
  Administered 2021-07-07: 100 mL via INTRAVENOUS

## 2021-07-07 NOTE — Discharge Instructions (Addendum)
Take Augmentin as prescribed and complete the full course. Recheck with your doctor on Friday.  Return to ER for fever, worsening pain or other concerning symptoms. Follow-up with GI, referral given.

## 2021-07-07 NOTE — ED Notes (Signed)
Patient transported to CT 

## 2021-07-07 NOTE — ED Triage Notes (Signed)
Abdominal pain today. She has had 9 bowel movements today. No vomiting.

## 2021-07-07 NOTE — ED Provider Notes (Signed)
[facility] EMERGENCY DEPARTMENT Provider Note   CSN: [id number] Arrival date & time: 07/07/21  1751     History Chief Complaint  Patient presents with   Abdominal Pain    Cathy Simmons is a 55 y.o. female.  55 year old female presents with complaint of lower abdominal pain with diarrhea.  Pain started around noon today, diarrhea onset around 2 PM.  Described as numerous episodes of loose green stools, pain is described as dull, does not radiate, nothing makes pain better or worse.  Denies nausea, vomiting, fevers or chills.  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      Past Medical History:  Diagnosis Date   Anxiety    Asplenia    Depression    Family history of anesthesia complication    mother difficult to wake   GERD (gastroesophageal reflux disease)    History of blood clots    Hyperlipidemia     Patient Active Problem List   Diagnosis Date Noted   Bronchitis 04/03/2021   History of cerebral venous sinus thrombosis 11/17/2020   Pure hypercholesterolemia 11/17/2020   Family history of breast cancer 05/26/2020   GAD (generalized anxiety disorder) 03/28/2020   Depression, major, single episode, moderate (HCC) 03/28/2020   Bilateral plantar fasciitis 09/03/2019   OAB (overactive bladder) 09/03/2019   Cerebral venous sinus thrombosis 01/24/2019   Dislocation of jaw 05/15/2018   TMJ syndrome 05/15/2018   Asplenia    Anxiety and depression     Past Surgical History:  Procedure Laterality Date   BREAST BIOPSY Right    needle core biopsy, benign   BREAST EXCISIONAL BIOPSY Left    2 times left breast, benign both times   BREAST SURGERY  breat biopsy x2 last 1995   DILATION AND CURETTAGE OF UTERUS  x2 1984 and 1991   excision left ganglion cyst Left    FOREIGN BODY REMOVAL Right 01/29/2013   Procedure: EXCISION FOREIGN BODY RIGHT SMALL FINGER;  Surgeon: Nicki Reaper, MD;  Location: [facility];  Service: Orthopedics;  Laterality: Right;   removal spleen  2000     OB History     Gravida  6   Para  3   Term  3   Preterm      AB  3   Living  2      SAB  3   IAB      Ectopic      Multiple      Live Births  2           Family History  Problem Relation Age of Onset   Stroke Mother    Cancer Mother        breast cancer   Breast cancer Mother    Diabetes Father    Parkinson's disease Father     Social History   Tobacco Use   Smoking status: Former    Packs/day: 0.50    Types: Cigarettes    Quit date: 01/30/1999    Years since quitting: 22.4   Smokeless tobacco: Never  Substance Use Topics   Alcohol use: No   Drug use: No    Home Medications Prior to Admission medications   Medication Sig Start Date End Date Taking? Authorizing Provider  amoxicillin-clavulanate (AUGMENTIN) 875-125 MG tablet Take 1 tablet by mouth every 12 (twelve) hours. 07/07/21  Yes Jeannie Fend, PA-C  HYDROcodone-acetaminophen (NORCO/VICODIN) 5-325 MG tablet Take 1 tablet  by mouth every 4 (four) hours as needed. 07/07/21  Yes Jeannie FendMurphy, Kirk Sampley A, PA-C  ondansetron (ZOFRAN ODT) 4 MG disintegrating tablet Take 1 tablet (4 mg total) by mouth every 8 (eight) hours as needed for nausea or vomiting. 07/07/21  Yes Jeannie FendMurphy, Jaynee Winters A, PA-C  albuterol (VENTOLIN HFA) 108 (90 Base) MCG/ACT inhaler Inhale 2 puffs into the lungs every 6 (six) hours as needed for wheezing or shortness of breath. 04/03/21   Sandford Craze'Sullivan, Melissa, NP  amitriptyline (ELAVIL) 25 MG tablet Take 1 tablet (25 mg total) by mouth at bedtime. 03/28/20   Sharlene DoryWendling, Nicholas Paul, DO  cetirizine (ZYRTEC) 10 MG tablet Take 10 mg by mouth daily.    [provider]  Cholecalciferol (VITAMIN D-3) 1000 units CAPS Take 1,000 Units by mouth daily.     [provider]  clonazePAM (KLONOPIN) 0.5 MG tablet Take 1 tablet (0.5 mg total) by mouth 2 (two) times daily as needed. for anxiety 01/21/21   Sharlene DoryWendling, Nicholas Paul,  DO  dicyclomine (BENTYL) 10 MG capsule Take 1 tab every 6 hours as needed for abdominal cramping. 06/22/21   Sharlene DoryWendling, Nicholas Paul, DO  esomeprazole (NEXIUM) 20 MG capsule Take 20 mg by mouth daily at 12 noon.    [provider]  fluticasone (FLONASE) 50 MCG/ACT nasal spray Place 2 sprays into both nostrils daily. 04/01/20   Sharlene DoryWendling, Nicholas Paul, DO  ondansetron (ZOFRAN-ODT) 4 MG disintegrating tablet Take 1 tablet (4 mg total) by mouth every 8 (eight) hours as needed for nausea or vomiting. 06/22/21   Carmelia RollerWendling, Jilda RocheNicholas Paul, DO  predniSONE (DELTASONE) 10 MG tablet 4 tabs by mouth once daily for 2 days, then 3 tabs daily x 2 days, then 2 tabs daily x 2 days, then 1 tab daily x 2 days 04/03/21   Sandford Craze'Sullivan, Melissa, NP  promethazine-dextromethorphan (PROMETHAZINE-DM) 6.25-15 MG/5ML syrup Take 5 mLs by mouth 4 (four) times daily as needed for cough. 04/03/21   Sandford Craze'Sullivan, Melissa, NP  rivaroxaban (XARELTO) 20 MG TABS tablet Take 1 tablet (20 mg total) by mouth daily with supper. 05/25/21   Sharlene DoryWendling, Nicholas Paul, DO  rosuvastatin (CRESTOR) 20 MG tablet Take 1 tablet (20 mg total) by mouth daily. 04/28/21   Sharlene DoryWendling, Nicholas Paul, DO  vitamin B-12 (CYANOCOBALAMIN) 1000 MCG tablet Take 500-1,000 mcg by mouth daily.     [provider]    Allergies    Bactrim [sulfamethoxazole-trimethoprim], Adhesive [tape], and Cymbalta [duloxetine hcl]  Review of Systems   Review of Systems  Constitutional:  Negative for chills and fever.  Respiratory:  Negative for shortness of breath.   Cardiovascular:  Negative for chest pain.  Gastrointestinal:  Positive for abdominal pain and diarrhea. Negative for blood in stool, constipation, nausea and vomiting.  Genitourinary:  Negative for dysuria and frequency.  Musculoskeletal:  Negative for arthralgias and myalgias.  Skin:  Negative for rash and wound.  Allergic/Immunologic: Positive for immunocompromised state.  Neurological:  Negative for  weakness.  Hematological:  Negative for adenopathy.  Psychiatric/Behavioral:  Negative for confusion.   All other systems reviewed and are negative.  Physical Exam Updated Vital Signs BP (!) 117/56 (BP Location: Right Arm)   Pulse 90   Temp 98.5 F (36.9 C) (Oral)   Resp 16   Ht 5\' 2"  (1.575 m)   Wt 66.3 kg   SpO2 99%   BMI 26.73 kg/m   Physical Exam Vitals and nursing note reviewed.  Constitutional:      General: She is not in acute  distress.    Appearance: She is well-developed. She is not diaphoretic.  HENT:     Head: Normocephalic and atraumatic.  Cardiovascular:     Rate and Rhythm: Normal rate and regular rhythm.     Heart sounds: Normal heart sounds.  Pulmonary:     Effort: Pulmonary effort is normal.     Breath sounds: Normal breath sounds.  Abdominal:     Palpations: Abdomen is soft.     Tenderness: There is abdominal tenderness in the left upper quadrant.  Skin:    General: Skin is warm and dry.     Findings: No erythema or rash.  Neurological:     Mental Status: She is alert and oriented to person, place, and time.  Psychiatric:        Behavior: Behavior normal.    ED Results / Procedures / Treatments   Labs (all labs ordered are listed, but only abnormal results are displayed) Labs Reviewed  CBC - Abnormal; Notable for the following components:      Result Value   WBC 13.8 (*)    All other components within normal limits  URINALYSIS, ROUTINE W REFLEX MICROSCOPIC - Abnormal; Notable for the following components:   Specific Gravity, Urine <1.005 (*)    Hgb urine dipstick SMALL (*)    All other components within normal limits  URINALYSIS, MICROSCOPIC (REFLEX) - Abnormal; Notable for the following components:   Bacteria, UA RARE (*)    All other components within normal limits  LIPASE, BLOOD  COMPREHENSIVE METABOLIC PANEL    EKG None  Radiology CT Abdomen Pelvis W Contrast  Result Date: 07/07/2021 CLINICAL DATA:  Acute abdominal pain EXAM: CT  ABDOMEN AND PELVIS WITH CONTRAST TECHNIQUE: Multidetector CT imaging of the abdomen and pelvis was performed using the standard protocol following bolus administration of intravenous contrast. CONTRAST:  OMNIPAQUE IOHEXOL 300 MG/ML  SOLN COMPARISON:  06/30/2020 FINDINGS: Lower chest: No acute abnormality. Hepatobiliary: No focal liver abnormality is seen. No gallstones, gallbladder wall thickening, or biliary dilatation. Pancreas: Unremarkable. No pancreatic ductal dilatation or surrounding inflammatory changes. Spleen: Spleen has been surgically removed. Adrenals/Urinary Tract: Adrenal glands are within normal limits. Kidneys demonstrate a normal enhancement pattern bilaterally. Normal excretion is noted on delayed images. Left upper pole calculi are noted similar to that seen on the prior exam measuring up to 6 mm. No ureteral obstructive changes are seen. Bladder is within normal limits. Stomach/Bowel: Diverticular change of the colon is noted. No evidence of diverticulitis is seen. The appendix is within normal limits. Small bowel and stomach are unremarkable. Vascular/Lymphatic: No significant vascular findings are present. No enlarged abdominal or pelvic lymph nodes. Reproductive: Uterus is within normal limits. Stable appearing 2.5 cm simple appearing cyst is noted in the right adnexa. Other: No abdominal wall hernia or abnormality. No abdominopelvic ascites. Musculoskeletal: No acute or significant osseous findings. IMPRESSION: Stable nonobstructing left upper pole renal stone. Diverticulosis without diverticulitis. 2.5 cm simple appearing cyst in the right adnexa. No follow-up imaging recommended. Note: This recommendation does not apply to premenarchal patients and to those with increased risk (genetic, family history, elevated tumor markers or other high-risk factors) of ovarian cancer. Reference: JACR 2020 Feb; 17(2):248-254 No acute abnormality noted. Electronically Signed   By: Alcide Clever M.D.    On: 07/07/2021 22:17    Procedures Procedures   Medications Ordered in ED Medications  amoxicillin-clavulanate (AUGMENTIN) 875-125 MG per tablet 1 tablet (has no administration in time range)  ondansetron (ZOFRAN) injection 4  mg (4 mg Intravenous Given 07/07/21 2207)  morphine 4 MG/ML injection 4 mg (4 mg Intravenous Given 07/07/21 2210)  sodium chloride 0.9 % bolus 1,000 mL (1,000 mLs Intravenous New Bag/Given 07/07/21 2206)  iohexol (OMNIPAQUE) 300 MG/ML solution 100 mL (100 mLs Intravenous Contrast Given 07/07/21 2207)    ED Course  I have reviewed the triage vital signs and the nursing notes.  Pertinent labs & imaging results that were available during my care of the patient were reviewed by me and considered in my medical decision making (see chart for details).  Clinical Course as of 07/07/21 2245  Tue Jul 07, 2021  257 55 year old female with complaint of left lower quadrant abdominal pain and diarrhea today.  On exam, found to have tenderness left lower quadrant.  No history of diverticulitis.  Last colonoscopy greater than 10 years ago, has been unable to schedule follow-up due to inability to obtain records from [place]. Found to have mild leukocytosis with white count of 13.8.  CMP unremarkable, urinalysis with small hemoglobin, no evidence of UTI.  Lipase within normal meds.  Vitals reassuring including O2 sat 96% on room air. CT with incidental finding of kidney stone, no ureteral lithiasis.  Does have diverticular disease without evidence of diverticulitis.  Clinically has diverticulitis with left lower quadrant pain and diarrhea with increase in white blood cell count today.  We will treat with Augmentin.  Given prescription for Norco and Zofran, referred to PCP for follow-up given return to ER precautions. [LM]    Clinical Course User Index [LM] Alden Hipp   MDM Rules/Calculators/A&P                           Final Clinical Impression(s) / ED Diagnoses Final  diagnoses:  Diverticulitis    Rx / DC Orders ED Discharge Orders          Ordered    amoxicillin-clavulanate (AUGMENTIN) 875-125 MG tablet  Every 12 hours        07/07/21 2242    HYDROcodone-acetaminophen (NORCO/VICODIN) 5-325 MG tablet  Every 4 hours PRN        07/07/21 2242    ondansetron (ZOFRAN ODT) 4 MG disintegrating tablet  Every 8 hours PRN        07/07/21 2242             Jeannie Fend, PA-C 07/07/21 2246    Jacalyn Lefevre, MD 07/07/21 2309

## 2021-07-08 ENCOUNTER — Encounter: Payer: Self-pay | Admitting: Family Medicine

## 2021-07-08 ENCOUNTER — Ambulatory Visit: Payer: BC Managed Care – PPO | Admitting: Family Medicine

## 2021-07-08 VITALS — BP 120/68 | HR 85 | Temp 98.1°F | Ht 62.0 in | Wt 143.5 lb

## 2021-07-08 DIAGNOSIS — R1032 Left lower quadrant pain: Secondary | ICD-10-CM | POA: Diagnosis not present

## 2021-07-08 MED ORDER — FLUCONAZOLE 150 MG PO TABS: ORAL_TABLET | ORAL | 0 refills | Status: DC

## 2021-07-08 NOTE — Patient Instructions (Signed)
If you do not hear anything about your referral in the next 1-2 weeks, call our office and ask for an update.  Send me a message if you don't turn the corner with the medicine from the ED.  If your stress levels are worsening your pain, we may be dealing with a different diagnosis.  Let us know if you need anything.

## 2021-07-08 NOTE — Progress Notes (Signed)
Chief Complaint  Patient presents with   Follow-up   Diverticulitis    Subjective: Patient is a 55 y.o. female here for ER f/u.  Patient seen on 8/9 at the emergency department and diagnosed with diverticulitis.  She had a CT of the abdomen/pelvis that did not show any diverticulitis or inflammation of the colon.  She continues to have loose stools, pain is slightly better.  She has not yet picked up the Augmentin. Stress levels have been high.   Denies fevers or bleeding. She does get yeast infections on abx.   Past Medical History:  Diagnosis Date   Anxiety    Asplenia    Depression    Family history of anesthesia complication    mother difficult to wake   GERD (gastroesophageal reflux disease)    History of blood clots    Hyperlipidemia     Objective: BP 120/68   Pulse 85   Temp 98.1 F (36.7 C) (Oral)   Ht 5\' 2"  (1.575 m)   Wt 143 lb 8 oz (65.1 kg)   SpO2 97%   BMI 26.25 kg/m  General: Awake, appears stated age Abd: BS+, S, NT, ND Heart: RRR Lungs: CTAB, no rales, wheezes or rhonchi. No accessory muscle use Psych: Age appropriate judgment and insight, normal affect and mood  Assessment and Plan: Left lower quadrant abdominal pain - Plan: Ambulatory referral to Gastroenterology  Surprisingly no ttp today. OK to see how diverticulitis tx goes. Will refer to GI as she needs a colonoscopy anyway. If no improvement, would consider IBS tx as stress levels have been quite high and there is no evidence of inflammation on CT.  The patient voiced understanding and agreement to the plan.  Dowell, DO 07/08/21  4:02 PM

## 2021-07-14 ENCOUNTER — Telehealth: Payer: Self-pay | Admitting: Family Medicine

## 2021-07-14 NOTE — Telephone Encounter (Signed)
Pt dropped off document to be filled out (FMLA paperwork- 7 pages) Pt would like document to be faxed when ready to [phone]. Document put at front office tray under providers name.

## 2021-07-15 NOTE — Telephone Encounter (Signed)
PCP completed paperwork Have faxed to [employer] Attn: GRACE at [phone] Received fax confirmation. Sent document to scan

## 2021-07-19 ENCOUNTER — Emergency Department (HOSPITAL_BASED_OUTPATIENT_CLINIC_OR_DEPARTMENT_OTHER): Payer: BC Managed Care – PPO

## 2021-07-19 ENCOUNTER — Encounter (HOSPITAL_BASED_OUTPATIENT_CLINIC_OR_DEPARTMENT_OTHER): Payer: Self-pay | Admitting: Emergency Medicine

## 2021-07-19 ENCOUNTER — Other Ambulatory Visit: Payer: Self-pay

## 2021-07-19 ENCOUNTER — Emergency Department (HOSPITAL_BASED_OUTPATIENT_CLINIC_OR_DEPARTMENT_OTHER)
Admission: EM | Admit: 2021-07-19 | Discharge: 2021-07-20 | Disposition: A | Discharge status: Home / Self Care | Payer: BC Managed Care – PPO | Attending: Emergency Medicine | Admitting: Emergency Medicine

## 2021-07-19 DIAGNOSIS — R197 Diarrhea, unspecified: Secondary | ICD-10-CM | POA: Insufficient documentation

## 2021-07-19 DIAGNOSIS — Z79899 Other long term (current) drug therapy: Secondary | ICD-10-CM | POA: Diagnosis not present

## 2021-07-19 DIAGNOSIS — R112 Nausea with vomiting, unspecified: Secondary | ICD-10-CM | POA: Insufficient documentation

## 2021-07-19 DIAGNOSIS — B349 Viral infection, unspecified: Secondary | ICD-10-CM

## 2021-07-19 DIAGNOSIS — R519 Headache, unspecified: Secondary | ICD-10-CM | POA: Insufficient documentation

## 2021-07-19 DIAGNOSIS — Z20822 Contact with and (suspected) exposure to covid-19: Secondary | ICD-10-CM | POA: Diagnosis not present

## 2021-07-19 DIAGNOSIS — Z87891 Personal history of nicotine dependence: Secondary | ICD-10-CM | POA: Diagnosis not present

## 2021-07-19 LAB — CBC WITH DIFFERENTIAL/PLATELET
Abs Immature Granulocytes: 0.03 10*3/uL (ref 0.00–0.07)
Basophils Absolute: 0.1 10*3/uL (ref 0.0–0.1)
Basophils Relative: 1 %
Eosinophils Absolute: 0 10*3/uL (ref 0.0–0.5)
Eosinophils Relative: 0 %
HCT: 42.1 % (ref 36.0–46.0)
Hemoglobin: 14.1 g/dL (ref 12.0–15.0)
Immature Granulocytes: 0 %
Lymphocytes Relative: 26 %
Lymphs Abs: 2.9 10*3/uL (ref 0.7–4.0)
MCH: 30.8 pg (ref 26.0–34.0)
MCHC: 33.5 g/dL (ref 30.0–36.0)
MCV: 91.9 fL (ref 80.0–100.0)
Monocytes Absolute: 0.9 10*3/uL (ref 0.1–1.0)
Monocytes Relative: 8 %
Neutro Abs: 7.4 10*3/uL (ref 1.7–7.7)
Neutrophils Relative %: 65 %
Platelets: 273 10*3/uL (ref 150–400)
RBC: 4.58 MIL/uL (ref 3.87–5.11)
RDW: 13.9 % (ref 11.5–15.5)
WBC: 11.4 10*3/uL — ABNORMAL HIGH (ref 4.0–10.5)
nRBC: 0 % (ref 0.0–0.2)

## 2021-07-19 MED ORDER — SODIUM CHLORIDE 0.9 % IV BOLUS
500.0000 mL | Freq: Once | INTRAVENOUS | Status: AC
  Administered 2021-07-19: 500 mL via INTRAVENOUS

## 2021-07-19 MED ORDER — ONDANSETRON HCL 4 MG/2ML IJ SOLN
4.0000 mg | Freq: Once | INTRAMUSCULAR | Status: AC
  Administered 2021-07-19: 4 mg via INTRAVENOUS
  Filled 2021-07-19: qty 2

## 2021-07-19 MED ORDER — MAGNESIUM SULFATE 2 GM/50ML IV SOLN
2.0000 g | Freq: Once | INTRAVENOUS | Status: AC
  Administered 2021-07-19: 2 g via INTRAVENOUS
  Filled 2021-07-19: qty 50

## 2021-07-19 NOTE — ED Triage Notes (Signed)
Pt reports HA all day + NVD

## 2021-07-20 ENCOUNTER — Encounter (HOSPITAL_BASED_OUTPATIENT_CLINIC_OR_DEPARTMENT_OTHER): Payer: Self-pay | Admitting: Emergency Medicine

## 2021-07-20 ENCOUNTER — Emergency Department (HOSPITAL_BASED_OUTPATIENT_CLINIC_OR_DEPARTMENT_OTHER): Payer: BC Managed Care – PPO

## 2021-07-20 DIAGNOSIS — R519 Headache, unspecified: Secondary | ICD-10-CM | POA: Diagnosis not present

## 2021-07-20 LAB — BASIC METABOLIC PANEL
Anion gap: 10 (ref 5–15)
BUN: 12 mg/dL (ref 6–20)
CO2: 27 mmol/L (ref 22–32)
Calcium: 9.6 mg/dL (ref 8.9–10.3)
Chloride: 102 mmol/L (ref 98–111)
Creatinine, Ser: 0.54 mg/dL (ref 0.44–1.00)
GFR, Estimated: >60 mL/min (ref >60)
Glucose, Bld: 102 mg/dL — ABNORMAL HIGH (ref 70–99)
Potassium: 3.6 mmol/L (ref 3.5–5.1)
Sodium: 139 mmol/L (ref 135–145)

## 2021-07-20 LAB — RESP PANEL BY RT-PCR (FLU A&B, COVID) ARPGX2
Influenza A by PCR: NEGATIVE
Influenza B by PCR: NEGATIVE
SARS Coronavirus 2 by RT PCR: NEGATIVE

## 2021-07-20 MED ORDER — KETOROLAC TROMETHAMINE 30 MG/ML IJ SOLN
30.0000 mg | Freq: Once | INTRAMUSCULAR | Status: AC
  Administered 2021-07-20: 30 mg via INTRAVENOUS
  Filled 2021-07-20: qty 1

## 2021-07-20 MED ORDER — IOHEXOL 350 MG/ML SOLN
100.0000 mL | Freq: Once | INTRAVENOUS | Status: AC | PRN
  Administered 2021-07-20: 100 mL via INTRAVENOUS

## 2021-07-20 MED ORDER — ACETAMINOPHEN 500 MG PO TABS
1000.0000 mg | ORAL_TABLET | Freq: Once | ORAL | Status: AC
  Administered 2021-07-20: 1000 mg via ORAL
  Filled 2021-07-20: qty 2

## 2021-07-20 MED ORDER — ONDANSETRON 8 MG PO TBDP: ORAL_TABLET | ORAL | 0 refills | Status: DC

## 2021-07-20 NOTE — ED Notes (Signed)
Pt A&OX4, ambulatory at d/c with independent steady gait, NAD. Pt verbalized understanding of d/c instructions, prescription and follow up care. 

## 2021-07-20 NOTE — ED Notes (Signed)
Patient transported to CT 

## 2021-07-20 NOTE — ED Provider Notes (Addendum)
[facility] EMERGENCY DEPARTMENT Provider Note   CSN: [id number] Arrival date & time: 07/19/21  2231     History Chief Complaint  Patient presents with   Headache   Emesis    Cathy Simmons is a 55 y.o. female.  The history is provided by the patient.  Headache Pain location:  Generalized Quality:  Dull Radiates to:  Does not radiate Severity currently:  5/10 Severity at highest:  5/10 Onset quality:  Gradual Duration:  1 day Timing:  Constant Progression:  Unchanged Chronicity:  New Context: not caffeine and not coughing   Relieved by:  Nothing Worsened by:  Nothing Ineffective treatments:  None tried Associated symptoms: diarrhea, fatigue, nausea and vomiting   Associated symptoms: no back pain, no blurred vision, no cough, no drainage, no eye pain, no facial pain, no fever, no focal weakness, no loss of balance, no neck pain, no neck stiffness, no paresthesias, no photophobia, no seizures, no sore throat, no swollen glands, no syncope and no tingling   Risk factors: no anger   Emesis Associated symptoms: diarrhea and headaches   Associated symptoms: no cough, no fever and no sore throat   Patient presents with body aches and headache and n/v/d.  No vision changes, no confusion.  No abdominal pain.      Past Medical History:  Diagnosis Date   Anxiety    Asplenia    Depression    Family history of anesthesia complication    mother difficult to wake   GERD (gastroesophageal reflux disease)    History of blood clots    Hyperlipidemia     Patient Active Problem List   Diagnosis Date Noted   Bronchitis 04/03/2021   History of cerebral venous sinus thrombosis 11/17/2020   Pure hypercholesterolemia 11/17/2020   Family history of breast cancer 05/26/2020   GAD (generalized anxiety disorder) 03/28/2020   Depression, major, single episode, moderate (HCC) 03/28/2020   Bilateral plantar fasciitis 09/03/2019   OAB (overactive bladder) 09/03/2019    Cerebral venous sinus thrombosis 01/24/2019   Dislocation of jaw 05/15/2018   TMJ syndrome 05/15/2018   Asplenia    Anxiety and depression     Past Surgical History:  Procedure Laterality Date   BREAST BIOPSY Right    needle core biopsy, benign   BREAST EXCISIONAL BIOPSY Left    2 times left breast, benign both times   BREAST SURGERY  breat biopsy x2 last 1995   DILATION AND CURETTAGE OF UTERUS  x2 1984 and 1991   excision left ganglion cyst Left    FOREIGN BODY REMOVAL Right 01/29/2013   Procedure: EXCISION FOREIGN BODY RIGHT SMALL FINGER;  Surgeon: Nicki Reaper, MD;  Location: [facility];  Service: Orthopedics;  Laterality: Right;   removal spleen  2000     OB History     Gravida  6   Para  3   Term  3   Preterm      AB  3   Living  2      SAB  3   IAB      Ectopic      Multiple      Live Births  2           Family History  Problem Relation Age of Onset   Stroke Mother    Cancer Mother        breast cancer   Breast cancer Mother    Diabetes Father  Parkinson's disease Father     Social History   Tobacco Use   Smoking status: Former    Packs/day: 0.50    Types: Cigarettes    Quit date: 01/30/1999    Years since quitting: 22.4   Smokeless tobacco: Never  Substance Use Topics   Alcohol use: No   Drug use: No    Home Medications Prior to Admission medications   Medication Sig Start Date End Date Taking? Authorizing Provider  ondansetron (ZOFRAN ODT) 8 MG disintegrating tablet 8mg  ODT q8 hours prn nausea/ vomiting 07/20/21  Yes Harbor Vanover, MD  albuterol (VENTOLIN HFA) 108 (90 Base) MCG/ACT inhaler Inhale 2 puffs into the lungs every 6 (six) hours as needed for wheezing or shortness of breath. 04/03/21   06/03/21, NP  amitriptyline (ELAVIL) 25 MG tablet Take 1 tablet (25 mg total) by mouth at bedtime. 03/28/20   03/30/20, DO  amoxicillin-clavulanate (AUGMENTIN) 875-125 MG tablet Take 1 tablet by mouth  every 12 (twelve) hours. 07/07/21   09/06/21, PA-C  cetirizine (ZYRTEC) 10 MG tablet Take 10 mg by mouth daily.    [provider]  Cholecalciferol (VITAMIN D-3) 1000 units CAPS Take 1,000 Units by mouth daily.     [provider]  clonazePAM (KLONOPIN) 0.5 MG tablet Take 1 tablet (0.5 mg total) by mouth 2 (two) times daily as needed. for anxiety 01/21/21   01/23/21, DO  dicyclomine (BENTYL) 10 MG capsule Take 1 tab every 6 hours as needed for abdominal cramping. 06/22/21   06/24/21, DO  esomeprazole (NEXIUM) 20 MG capsule Take 20 mg by mouth daily at 12 noon.    [provider]  fluconazole (DIFLUCAN) 150 MG tablet Take 1 tab, repeat in 48 hours if no improvement. 07/08/21   09/07/21, DO  fluticasone (FLONASE) 50 MCG/ACT nasal spray Place 2 sprays into both nostrils daily. 04/01/20   06/01/20, DO  HYDROcodone-acetaminophen (NORCO/VICODIN) 5-325 MG tablet Take 1 tablet by mouth every 4 (four) hours as needed. 07/07/21   09/06/21, PA-C  ondansetron (ZOFRAN ODT) 4 MG disintegrating tablet Take 1 tablet (4 mg total) by mouth every 8 (eight) hours as needed for nausea or vomiting. 07/07/21   09/06/21, PA-C  ondansetron (ZOFRAN-ODT) 4 MG disintegrating tablet Take 1 tablet (4 mg total) by mouth every 8 (eight) hours as needed for nausea or vomiting. 06/22/21   06/24/21, Carmelia Roller, DO  predniSONE (DELTASONE) 10 MG tablet 4 tabs by mouth once daily for 2 days, then 3 tabs daily x 2 days, then 2 tabs daily x 2 days, then 1 tab daily x 2 days 04/03/21   06/03/21, NP  promethazine-dextromethorphan (PROMETHAZINE-DM) 6.25-15 MG/5ML syrup Take 5 mLs by mouth 4 (four) times daily as needed for cough. 04/03/21   06/03/21, NP  rivaroxaban (XARELTO) 20 MG TABS tablet Take 1 tablet (20 mg total) by mouth daily with supper. 05/25/21   05/27/21, DO  rosuvastatin (CRESTOR) 20 MG tablet Take 1  tablet (20 mg total) by mouth daily. 04/28/21   04/30/21, DO  vitamin B-12 (CYANOCOBALAMIN) 1000 MCG tablet Take 500-1,000 mcg by mouth daily.     [provider]    Allergies    Bactrim [sulfamethoxazole-trimethoprim], Adhesive [tape], and Cymbalta [duloxetine hcl]  Review of Systems   Review of Systems  Constitutional:  Positive for fatigue. Negative for fever.  HENT:  Negative for postnasal drip  and sore throat.   Eyes:  Negative for blurred vision, photophobia, pain and visual disturbance.  Respiratory:  Negative for cough.   Cardiovascular:  Negative for syncope.  Gastrointestinal:  Positive for diarrhea, nausea and vomiting.  Genitourinary:  Negative for difficulty urinating.  Musculoskeletal:  Negative for back pain, neck pain and neck stiffness.  Neurological:  Positive for headaches. Negative for focal weakness, seizures, facial asymmetry, paresthesias and loss of balance.  Psychiatric/Behavioral:  Negative for agitation.   All other systems reviewed and are negative.  Physical Exam Updated Vital Signs BP 112/65   Pulse 80   Temp 98.3 F (36.8 C) (Oral)   Resp 16   Ht 5\' 2"  (1.575 m)   Wt 64.9 kg   SpO2 97%   BMI 26.16 kg/m   Physical Exam Vitals and nursing note reviewed.  Constitutional:      General: She is not in acute distress.    Appearance: Normal appearance.  HENT:     Head: Normocephalic and atraumatic.     Nose: Nose normal.     Mouth/Throat:     Mouth: Mucous membranes are moist.     Pharynx: Oropharynx is clear.  Eyes:     Extraocular Movements: Extraocular movements intact.     Conjunctiva/sclera: Conjunctivae normal.     Pupils: Pupils are equal, round, and reactive to light.     Comments: No proptosis, intact cognition,  disk sharp   Cardiovascular:     Rate and Rhythm: Normal rate and regular rhythm.     Pulses: Normal pulses.     Heart sounds: Normal heart sounds.  Pulmonary:     Effort: Pulmonary effort is  normal.     Breath sounds: Normal breath sounds.  Abdominal:     General: Abdomen is flat. Bowel sounds are normal.     Palpations: Abdomen is soft.     Tenderness: There is no abdominal tenderness. There is no guarding.  Musculoskeletal:        General: Normal range of motion.     Cervical back: Normal range of motion and neck supple. No rigidity.  Lymphadenopathy:     Cervical: No cervical adenopathy.  Skin:    General: Skin is warm and dry.     Capillary Refill: Capillary refill takes less than 2 seconds.  Neurological:     General: No focal deficit present.     Mental Status: She is alert and oriented to person, place, and time.     Deep Tendon Reflexes: Reflexes normal.  Psychiatric:        Mood and Affect: Mood normal.        Behavior: Behavior normal.    ED Results / Procedures / Treatments   Labs (all labs ordered are listed, but only abnormal results are displayed) Results for orders placed or performed during the hospital encounter of 07/19/21  Resp Panel by RT-PCR (Flu A&B, Covid) Nasopharyngeal Swab   Specimen: Nasopharyngeal Swab; Nasopharyngeal(NP) swabs in vial transport medium  Result Value Ref Range   SARS Coronavirus 2 by RT PCR NEGATIVE NEGATIVE   Influenza A by PCR NEGATIVE NEGATIVE   Influenza B by PCR NEGATIVE NEGATIVE  CBC with Differential/Platelet  Result Value Ref Range   WBC 11.4 (H) 4.0 - 10.5 K/uL   RBC 4.58 3.87 - 5.11 MIL/uL   Hemoglobin 14.1 12.0 - 15.0 g/dL   HCT 47.842.1 29.536.0 - 62.146.0 %   MCV 91.9 80.0 - 100.0 fL   MCH 30.8 26.0 -  34.0 pg   MCHC 33.5 30.0 - 36.0 g/dL   RDW 16.1 09.6 - 04.5 %   Platelets 273 150 - 400 K/uL   nRBC 0.0 0.0 - 0.2 %   Neutrophils Relative % 65 %   Neutro Abs 7.4 1.7 - 7.7 K/uL   Lymphocytes Relative 26 %   Lymphs Abs 2.9 0.7 - 4.0 K/uL   Monocytes Relative 8 %   Monocytes Absolute 0.9 0.1 - 1.0 K/uL   Eosinophils Relative 0 %   Eosinophils Absolute 0.0 0.0 - 0.5 K/uL   Basophils Relative 1 %   Basophils  Absolute 0.1 0.0 - 0.1 K/uL   Immature Granulocytes 0 %   Abs Immature Granulocytes 0.03 0.00 - 0.07 K/uL  Basic metabolic panel  Result Value Ref Range   Sodium 139 135 - 145 mmol/L   Potassium 3.6 3.5 - 5.1 mmol/L   Chloride 102 98 - 111 mmol/L   CO2 27 22 - 32 mmol/L   Glucose, Bld 102 (H) 70 - 99 mg/dL   BUN 12 6 - 20 mg/dL   Creatinine, Ser 4.09 0.44 - 1.00 mg/dL   Calcium 9.6 8.9 - 81.1 mg/dL   GFR, Estimated >91 >47 mL/min   Anion gap 10 5 - 15   CT Abdomen Pelvis W Contrast  Result Date: 07/07/2021 CLINICAL DATA:  Acute abdominal pain EXAM: CT ABDOMEN AND PELVIS WITH CONTRAST TECHNIQUE: Multidetector CT imaging of the abdomen and pelvis was performed using the standard protocol following bolus administration of intravenous contrast. CONTRAST:  OMNIPAQUE IOHEXOL 300 MG/ML  SOLN COMPARISON:  06/30/2020 FINDINGS: Lower chest: No acute abnormality. Hepatobiliary: No focal liver abnormality is seen. No gallstones, gallbladder wall thickening, or biliary dilatation. Pancreas: Unremarkable. No pancreatic ductal dilatation or surrounding inflammatory changes. Spleen: Spleen has been surgically removed. Adrenals/Urinary Tract: Adrenal glands are within normal limits. Kidneys demonstrate a normal enhancement pattern bilaterally. Normal excretion is noted on delayed images. Left upper pole calculi are noted similar to that seen on the prior exam measuring up to 6 mm. No ureteral obstructive changes are seen. Bladder is within normal limits. Stomach/Bowel: Diverticular change of the colon is noted. No evidence of diverticulitis is seen. The appendix is within normal limits. Small bowel and stomach are unremarkable. Vascular/Lymphatic: No significant vascular findings are present. No enlarged abdominal or pelvic lymph nodes. Reproductive: Uterus is within normal limits. Stable appearing 2.5 cm simple appearing cyst is noted in the right adnexa. Other: No abdominal wall hernia or abnormality. No  abdominopelvic ascites. Musculoskeletal: No acute or significant osseous findings. IMPRESSION: Stable nonobstructing left upper pole renal stone. Diverticulosis without diverticulitis. 2.5 cm simple appearing cyst in the right adnexa. No follow-up imaging recommended. Note: This recommendation does not apply to premenarchal patients and to those with increased risk (genetic, family history, elevated tumor markers or other high-risk factors) of ovarian cancer. Reference: JACR 2020 Feb; 17(2):248-254 No acute abnormality noted. Electronically Signed   By: Alcide Clever M.D.   On: 07/07/2021 22:17   CT VENOGRAM HEAD  Result Date: 07/20/2021 CLINICAL DATA:  Initial evaluation for acute headache. EXAM: CT VENOGRAM HEAD TECHNIQUE: Venographic phase images of the brain were obtained following the administration of intravenous contrast. Multiplanar reformats and maximum intensity projections were generated. CONTRAST:  OMNIPAQUE IOHEXOL 350 MG/ML SOLN COMPARISON:  Prior CT from 01/25/2019. FINDINGS: Brain: Cerebral volume within normal limits for patient age. No evidence for acute intracranial hemorrhage. No findings to suggest acute large vessel territory infarct. No  mass lesion, midline shift, or mass effect. Ventricles are normal in size without evidence for hydrocephalus. No extra-axial fluid collection identified. Vascular: No hyperdense vessel seen prior to contrast administration. Following contrast administration, normal enhancement seen throughout the superior sagittal sinus to the torcula. Right transverse and sigmoid sinuses are patent as is the visualized proximal internal jugular vein. Left transverse sinus is patent. Probable small residual nonocclusive linear filling defect involving the left sigmoid sinus, relatively stable from prior (series 6, image 79). Left sigmoid sinus otherwise patent without acute thrombus. Visualized proximal left internal jugular vein patent. Straight sinus, vein of Galen,  internal cerebral veins, and basal veins of Rosenthal appear patent. No visible cavernous sinus thrombosis. Superior orbital veins symmetric and normal. No evidence for dural sinus thrombosis. No visible cortical vein thrombosis. Skull: Scalp soft tissues demonstrate no acute abnormality. Calvarium intact. Sinuses/Orbits: Globes and orbital soft tissues within normal limits. Visualized paranasal sinuses are clear. No mastoid effusion. IMPRESSION: 1. Small residual nonocclusive linear filling defect at the left sigmoid sinus, likely reflecting a small amount of chronic thrombus and/or fibrosis. Overall, appearance is relatively stable as compared to prior CT venogram from 01/25/2019. No acute dural sinus thrombosis. 2. Normal noncontrast head CT. No other acute intracranial abnormality. Electronically Signed   By: Rise Mu M.D.   On: 07/20/2021 01:32     Radiology CT VENOGRAM HEAD  Result Date: 07/20/2021 CLINICAL DATA:  Initial evaluation for acute headache. EXAM: CT VENOGRAM HEAD TECHNIQUE: Venographic phase images of the brain were obtained following the administration of intravenous contrast. Multiplanar reformats and maximum intensity projections were generated. CONTRAST:  OMNIPAQUE IOHEXOL 350 MG/ML SOLN COMPARISON:  Prior CT from 01/25/2019. FINDINGS: Brain: Cerebral volume within normal limits for patient age. No evidence for acute intracranial hemorrhage. No findings to suggest acute large vessel territory infarct. No mass lesion, midline shift, or mass effect. Ventricles are normal in size without evidence for hydrocephalus. No extra-axial fluid collection identified. Vascular: No hyperdense vessel seen prior to contrast administration. Following contrast administration, normal enhancement seen throughout the superior sagittal sinus to the torcula. Right transverse and sigmoid sinuses are patent as is the visualized proximal internal jugular vein. Left transverse sinus is patent.  Probable small residual nonocclusive linear filling defect involving the left sigmoid sinus, relatively stable from prior (series 6, image 79). Left sigmoid sinus otherwise patent without acute thrombus. Visualized proximal left internal jugular vein patent. Straight sinus, vein of Galen, internal cerebral veins, and basal veins of Rosenthal appear patent. No visible cavernous sinus thrombosis. Superior orbital veins symmetric and normal. No evidence for dural sinus thrombosis. No visible cortical vein thrombosis. Skull: Scalp soft tissues demonstrate no acute abnormality. Calvarium intact. Sinuses/Orbits: Globes and orbital soft tissues within normal limits. Visualized paranasal sinuses are clear. No mastoid effusion. IMPRESSION: 1. Small residual nonocclusive linear filling defect at the left sigmoid sinus, likely reflecting a small amount of chronic thrombus and/or fibrosis. Overall, appearance is relatively stable as compared to prior CT venogram from 01/25/2019. No acute dural sinus thrombosis. 2. Normal noncontrast head CT. No other acute intracranial abnormality. Electronically Signed   By: Rise Mu M.D.   On: 07/20/2021 01:32    Procedures Procedures   Medications Ordered in ED Medications  sodium chloride 0.9 % bolus 500 mL (0 mLs Intravenous Stopped 07/20/21 0005)  magnesium sulfate IVPB 2 g 50 mL (0 g Intravenous Stopped 07/20/21 0005)  ondansetron (ZOFRAN) injection 4 mg (4 mg Intravenous Given 07/19/21 2331)  acetaminophen (TYLENOL)  tablet 1,000 mg (1,000 mg Oral Given 07/20/21 0029)  iohexol (OMNIPAQUE) 350 MG/ML injection 100 mL (100 mLs Intravenous Contrast Given 07/20/21 0035)  ketorolac (TORADOL) 30 MG/ML injection 30 mg (30 mg Intravenous Given 07/20/21 0130)    ED Course  I have reviewed the triage vital signs and the nursing notes.  Pertinent labs & imaging results that were available during my care of the patient were reviewed by me and considered in my medical decision  making (see chart for details).  155 Case d/w neuro hospitalist, Dr. Derry Lory.  There is no acute thrombus, it is stable and the patient is anti coagulated this is not the source of today's HA.    Patient and family informed of chronic changes and express understanding.    Patient does not have acute dural sinus thrombosis.  Constellation of symptoms is almost certainly viral in nature.  COVID was negative.  Patient is anticoagulated.  Patient is stable for discharge with close follow up.  SHANOAH ASBILL was evaluated in Emergency Department on 07/20/2021 for the symptoms described in the history of present illness. She was evaluated in the context of the global COVID-19 pandemic, which necessitated consideration that the patient might be at risk for infection with the SARS-CoV-2 virus that causes COVID-19. Institutional protocols and algorithms that pertain to the evaluation of patients at risk for COVID-19 are in a state of rapid change based on information released by regulatory bodies including the CDC and federal and state organizations. These policies and algorithms were followed during the patient's care in the ED.    Final Clinical Impression(s) / ED Diagnoses Final diagnoses:  Nausea vomiting and diarrhea  Viral illness  Return for intractable cough, coughing up blood, fevers > 100.4 unrelieved by medication, shortness of breath, intractable vomiting, chest pain, shortness of breath, weakness, numbness, changes in speech, facial asymmetry, abdominal pain, passing out, Inability to tolerate liquids or food, cough, altered mental status or any concerns. No signs of systemic illness or infection. The patient is nontoxic-appearing on exam and vital signs are within normal limits. I have reviewed the triage vital signs and the nursing notes. Pertinent labs & imaging results that were available during my care of the patient were reviewed by me and considered in my medical decision making (see  chart for details). After history, exam, and medical workup I feel the patient has been appropriately medically screened and is safe for discharge home. Pertinent diagnoses were discussed with the patient. Patient was given return precautions.  Rx / DC Orders ED Discharge Orders          Ordered    ondansetron (ZOFRAN ODT) 8 MG disintegrating tablet        07/20/21 0112             Carlton Sweaney, MD 07/20/21 0155    Thara Searing, MD 07/20/21 0200

## 2021-07-27 ENCOUNTER — Encounter: Payer: Self-pay | Admitting: Family Medicine

## 2021-07-27 ENCOUNTER — Encounter: Payer: Self-pay | Admitting: Gastroenterology

## 2021-09-08 ENCOUNTER — Emergency Department (HOSPITAL_BASED_OUTPATIENT_CLINIC_OR_DEPARTMENT_OTHER): Payer: BC Managed Care – PPO

## 2021-09-08 ENCOUNTER — Emergency Department (HOSPITAL_BASED_OUTPATIENT_CLINIC_OR_DEPARTMENT_OTHER)
Admission: EM | Admit: 2021-09-08 | Discharge: 2021-09-08 | Disposition: A | Discharge status: Home / Self Care | Payer: BC Managed Care – PPO | Attending: Emergency Medicine | Admitting: Emergency Medicine

## 2021-09-08 ENCOUNTER — Encounter (HOSPITAL_BASED_OUTPATIENT_CLINIC_OR_DEPARTMENT_OTHER): Payer: Self-pay | Admitting: *Deleted

## 2021-09-08 ENCOUNTER — Other Ambulatory Visit: Payer: Self-pay

## 2021-09-08 DIAGNOSIS — K5792 Diverticulitis of intestine, part unspecified, without perforation or abscess without bleeding: Secondary | ICD-10-CM | POA: Insufficient documentation

## 2021-09-08 DIAGNOSIS — K5730 Diverticulosis of large intestine without perforation or abscess without bleeding: Secondary | ICD-10-CM | POA: Diagnosis not present

## 2021-09-08 DIAGNOSIS — K219 Gastro-esophageal reflux disease without esophagitis: Secondary | ICD-10-CM | POA: Diagnosis not present

## 2021-09-08 DIAGNOSIS — N83201 Unspecified ovarian cyst, right side: Secondary | ICD-10-CM | POA: Diagnosis not present

## 2021-09-08 DIAGNOSIS — Z87891 Personal history of nicotine dependence: Secondary | ICD-10-CM | POA: Insufficient documentation

## 2021-09-08 DIAGNOSIS — D72829 Elevated white blood cell count, unspecified: Secondary | ICD-10-CM | POA: Diagnosis not present

## 2021-09-08 DIAGNOSIS — K529 Noninfective gastroenteritis and colitis, unspecified: Secondary | ICD-10-CM | POA: Diagnosis not present

## 2021-09-08 DIAGNOSIS — R1032 Left lower quadrant pain: Secondary | ICD-10-CM | POA: Diagnosis not present

## 2021-09-08 DIAGNOSIS — N200 Calculus of kidney: Secondary | ICD-10-CM | POA: Diagnosis not present

## 2021-09-08 LAB — COMPREHENSIVE METABOLIC PANEL
ALT: 18 U/L (ref 0–44)
AST: 22 U/L (ref 15–41)
Albumin: 3.8 g/dL (ref 3.5–5.0)
Alkaline Phosphatase: 99 U/L (ref 38–126)
Anion gap: 8 (ref 5–15)
BUN: 6 mg/dL (ref 6–20)
CO2: 27 mmol/L (ref 22–32)
Calcium: 9.4 mg/dL (ref 8.9–10.3)
Chloride: 105 mmol/L (ref 98–111)
Creatinine, Ser: 0.56 mg/dL (ref 0.44–1.00)
GFR, Estimated: >60 mL/min (ref >60)
Glucose, Bld: 106 mg/dL — ABNORMAL HIGH (ref 70–99)
Potassium: 3.6 mmol/L (ref 3.5–5.1)
Sodium: 140 mmol/L (ref 135–145)
Total Bilirubin: 0.4 mg/dL (ref 0.3–1.2)
Total Protein: 7.5 g/dL (ref 6.5–8.1)

## 2021-09-08 LAB — CBC WITH DIFFERENTIAL/PLATELET
Abs Immature Granulocytes: 0.04 10*3/uL (ref 0.00–0.07)
Basophils Absolute: 0.1 10*3/uL (ref 0.0–0.1)
Basophils Relative: 1 %
Eosinophils Absolute: 0.1 10*3/uL (ref 0.0–0.5)
Eosinophils Relative: 1 %
HCT: 41.4 % (ref 36.0–46.0)
Hemoglobin: 13.8 g/dL (ref 12.0–15.0)
Immature Granulocytes: 0 %
Lymphocytes Relative: 20 %
Lymphs Abs: 2.8 10*3/uL (ref 0.7–4.0)
MCH: 30.8 pg (ref 26.0–34.0)
MCHC: 33.3 g/dL (ref 30.0–36.0)
MCV: 92.4 fL (ref 80.0–100.0)
Monocytes Absolute: 1.2 10*3/uL — ABNORMAL HIGH (ref 0.1–1.0)
Monocytes Relative: 8 %
Neutro Abs: 9.7 10*3/uL — ABNORMAL HIGH (ref 1.7–7.7)
Neutrophils Relative %: 70 %
Platelets: 306 10*3/uL (ref 150–400)
RBC: 4.48 MIL/uL (ref 3.87–5.11)
RDW: 14 % (ref 11.5–15.5)
WBC: 14 10*3/uL — ABNORMAL HIGH (ref 4.0–10.5)
nRBC: 0 % (ref 0.0–0.2)

## 2021-09-08 LAB — URINALYSIS, ROUTINE W REFLEX MICROSCOPIC
Bilirubin Urine: NEGATIVE
Glucose, UA: NEGATIVE mg/dL
Ketones, ur: NEGATIVE mg/dL
Leukocytes,Ua: NEGATIVE
Nitrite: NEGATIVE
Protein, ur: NEGATIVE mg/dL
Specific Gravity, Urine: 1.01 (ref 1.005–1.030)
pH: 7 (ref 5.0–8.0)

## 2021-09-08 LAB — URINALYSIS, MICROSCOPIC (REFLEX)

## 2021-09-08 LAB — LIPASE, BLOOD: Lipase: 20 U/L (ref 11–51)

## 2021-09-08 MED ORDER — AMOXICILLIN-POT CLAVULANATE 875-125 MG PO TABS: 1.0000 | ORAL_TABLET | Freq: Two times a day (BID) | ORAL | 0 refills | Status: DC

## 2021-09-08 MED ORDER — AMOXICILLIN-POT CLAVULANATE 875-125 MG PO TABS
1.0000 | ORAL_TABLET | Freq: Once | ORAL | Status: AC
  Administered 2021-09-08: 1 via ORAL
  Filled 2021-09-08: qty 1

## 2021-09-08 MED ORDER — MORPHINE SULFATE (PF) 4 MG/ML IV SOLN
4.0000 mg | Freq: Once | INTRAVENOUS | Status: AC
  Administered 2021-09-08: 4 mg via INTRAVENOUS
  Filled 2021-09-08: qty 1

## 2021-09-08 MED ORDER — ONDANSETRON HCL 4 MG/2ML IJ SOLN
4.0000 mg | Freq: Once | INTRAMUSCULAR | Status: AC
  Administered 2021-09-08: 4 mg via INTRAVENOUS
  Filled 2021-09-08: qty 2

## 2021-09-08 MED ORDER — MORPHINE SULFATE 15 MG PO TABS: 7.5000 mg | ORAL_TABLET | ORAL | 0 refills | Status: DC | PRN

## 2021-09-08 MED ORDER — ONDANSETRON 4 MG PO TBDP: ORAL_TABLET | ORAL | 0 refills | Status: DC

## 2021-09-08 NOTE — ED Provider Notes (Signed)
[facility] EMERGENCY DEPARTMENT Provider Note   CSN: [id number] Arrival date & time: 09/08/21  6389     History Chief Complaint  Patient presents with   Abdominal Pain    Cathy Simmons is a 55 y.o. female.  55 yo F with a chief complaints of left lower quadrant abdominal pain.  This started last night is very low in the left side of the abdomen worse with movement palpation and twisting.  She denies trauma to the area denies rash denies back pain.  Denies fevers.  Does not seem to be worse with eating or drinking.  Denies diarrhea denies dark stool or blood in her stool.  Denies a mass or a bulge to the area.  Denies history of kidney stones.  Denies urinary symptoms.  The patient actually was seen for symptoms like this previously had a CT scan that was negative for diverticulitis and she was started on antibiotics presumptively.  Never got them filled and the pain seemed to get better on its own.  I seen her family doctor and was referred to gastroenterology.  Has an appointment for this tomorrow.  The history is provided by the patient and the spouse.  Abdominal Pain Pain location:  LLQ Pain quality: sharp and shooting   Pain radiates to:  Does not radiate Pain severity:  Moderate Onset quality:  Gradual Duration:  2 days Timing:  Constant Progression:  Worsening Chronicity:  Recurrent Relieved by:  Nothing Worsened by:  Nothing Ineffective treatments:  None tried Associated symptoms: no chest pain, no chills, no dysuria, no fever, no nausea, no shortness of breath and no vomiting       Past Medical History:  Diagnosis Date   Anxiety    Asplenia    Depression    Family history of anesthesia complication    mother difficult to wake   GERD (gastroesophageal reflux disease)    History of blood clots    Hyperlipidemia     Patient Active Problem List   Diagnosis Date Noted   Bronchitis 04/03/2021   History of cerebral venous sinus thrombosis  11/17/2020   Pure hypercholesterolemia 11/17/2020   Family history of breast cancer 05/26/2020   GAD (generalized anxiety disorder) 03/28/2020   Depression, major, single episode, moderate (HCC) 03/28/2020   Bilateral plantar fasciitis 09/03/2019   OAB (overactive bladder) 09/03/2019   Cerebral venous sinus thrombosis 01/24/2019   Dislocation of jaw 05/15/2018   TMJ syndrome 05/15/2018   Asplenia    Anxiety and depression     Past Surgical History:  Procedure Laterality Date   BREAST BIOPSY Right    needle core biopsy, benign   BREAST EXCISIONAL BIOPSY Left    2 times left breast, benign both times   BREAST SURGERY  breat biopsy x2 last 1995   DILATION AND CURETTAGE OF UTERUS  x2 1984 and 1991   excision left ganglion cyst Left    FOREIGN BODY REMOVAL Right 01/29/2013   Procedure: EXCISION FOREIGN BODY RIGHT SMALL FINGER;  Surgeon: Nicki Reaper, MD;  Location: [facility];  Service: Orthopedics;  Laterality: Right;   removal spleen  2000     OB History     Gravida  6   Para  3   Term  3   Preterm      AB  3   Living  2      SAB  3   IAB      Ectopic  Multiple      Live Births  2           Family History  Problem Relation Age of Onset   Stroke Mother    Cancer Mother        breast cancer   Breast cancer Mother    Diabetes Father    Parkinson's disease Father     Social History   Tobacco Use   Smoking status: Former    Packs/day: 0.50    Types: Cigarettes    Quit date: 01/30/1999    Years since quitting: 22.6   Smokeless tobacco: Never  Substance Use Topics   Alcohol use: No   Drug use: No    Home Medications Prior to Admission medications   Medication Sig Start Date End Date Taking? Authorizing Provider  amoxicillin-clavulanate (AUGMENTIN) 875-125 MG tablet Take 1 tablet by mouth 2 (two) times daily. One po bid x 7 days 09/08/21  Yes Melene Plan, DO  morphine (MSIR) 15 MG tablet Take 0.5 tablets (7.5 mg total) by mouth  every 4 (four) hours as needed for severe pain. 09/08/21  Yes Melene Plan, DO  ondansetron (ZOFRAN ODT) 4 MG disintegrating tablet 4mg  ODT q4 hours prn nausea/vomit 09/08/21  Yes 11/08/21, DO  albuterol (VENTOLIN HFA) 108 (90 Base) MCG/ACT inhaler Inhale 2 puffs into the lungs every 6 (six) hours as needed for wheezing or shortness of breath. 04/03/21   06/03/21, NP  amitriptyline (ELAVIL) 25 MG tablet Take 1 tablet (25 mg total) by mouth at bedtime. 03/28/20   03/30/20, DO  cetirizine (ZYRTEC) 10 MG tablet Take 10 mg by mouth daily.    [provider]  Cholecalciferol (VITAMIN D-3) 1000 units CAPS Take 1,000 Units by mouth daily.     [provider]  clonazePAM (KLONOPIN) 0.5 MG tablet Take 1 tablet (0.5 mg total) by mouth 2 (two) times daily as needed. for anxiety 01/21/21   01/23/21, DO  dicyclomine (BENTYL) 10 MG capsule Take 1 tab every 6 hours as needed for abdominal cramping. 06/22/21   06/24/21, DO  esomeprazole (NEXIUM) 20 MG capsule Take 20 mg by mouth daily at 12 noon.    [provider]  fluconazole (DIFLUCAN) 150 MG tablet Take 1 tab, repeat in 48 hours if no improvement. 07/08/21   09/07/21, DO  fluticasone (FLONASE) 50 MCG/ACT nasal spray Place 2 sprays into both nostrils daily. 04/01/20   06/01/20, DO  HYDROcodone-acetaminophen (NORCO/VICODIN) 5-325 MG tablet Take 1 tablet by mouth every 4 (four) hours as needed. 07/07/21   09/06/21, PA-C  predniSONE (DELTASONE) 10 MG tablet 4 tabs by mouth once daily for 2 days, then 3 tabs daily x 2 days, then 2 tabs daily x 2 days, then 1 tab daily x 2 days 04/03/21   06/03/21, NP  promethazine-dextromethorphan (PROMETHAZINE-DM) 6.25-15 MG/5ML syrup Take 5 mLs by mouth 4 (four) times daily as needed for cough. 04/03/21   06/03/21, NP  rivaroxaban (XARELTO) 20 MG TABS tablet Take 1 tablet (20 mg total) by mouth daily with  supper. 05/25/21   05/27/21, DO  rosuvastatin (CRESTOR) 20 MG tablet Take 1 tablet (20 mg total) by mouth daily. 04/28/21   04/30/21, DO  vitamin B-12 (CYANOCOBALAMIN) 1000 MCG tablet Take 500-1,000 mcg by mouth daily.     [provider]    Allergies    Bactrim [sulfamethoxazole-trimethoprim], Adhesive [tape], and Cymbalta [  duloxetine hcl]  Review of Systems   Review of Systems  Constitutional:  Negative for chills and fever.  HENT:  Negative for congestion and rhinorrhea.   Eyes:  Negative for redness and visual disturbance.  Respiratory:  Negative for shortness of breath and wheezing.   Cardiovascular:  Negative for chest pain and palpitations.  Gastrointestinal:  Positive for abdominal pain. Negative for nausea and vomiting.  Genitourinary:  Negative for dysuria and urgency.  Musculoskeletal:  Negative for arthralgias and myalgias.  Skin:  Negative for pallor and wound.  Neurological:  Negative for dizziness and headaches.   Physical Exam Updated Vital Signs BP (!) 135/56 (BP Location: Right Arm)   Pulse (!) 106   Temp 99 F (37.2 C) (Oral)   Resp 20   Ht 5\' 2"  (1.575 m)   Wt 64.9 kg   SpO2 97%   BMI 26.16 kg/m   Physical Exam Vitals and nursing note reviewed.  Constitutional:      General: She is not in acute distress.    Appearance: She is well-developed. She is not diaphoretic.  HENT:     Head: Normocephalic and atraumatic.  Eyes:     Pupils: Pupils are equal, round, and reactive to light.  Cardiovascular:     Rate and Rhythm: Normal rate and regular rhythm.     Heart sounds: No murmur heard.   No friction rub. No gallop.  Pulmonary:     Effort: Pulmonary effort is normal.     Breath sounds: No wheezing or rales.  Abdominal:     General: There is no distension.     Palpations: Abdomen is soft.     Tenderness: There is abdominal tenderness.     Comments: Pain to the left lower quadrant just above the inguinal ligament.   No pain at the attachment of the adductor muscles to the pubic bone.  No appreciable defects to the fascia, no bulge or mass.  No rash.  No obvious midline spinal tenderness.  No CVA tenderness to percussion.  Musculoskeletal:        General: No tenderness.     Cervical back: Normal range of motion and neck supple.  Skin:    General: Skin is warm and dry.  Neurological:     Mental Status: She is alert and oriented to person, place, and time.  Psychiatric:        Behavior: Behavior normal.    ED Results / Procedures / Treatments   Labs (all labs ordered are listed, but only abnormal results are displayed) Labs Reviewed  CBC WITH DIFFERENTIAL/PLATELET - Abnormal; Notable for the following components:      Result Value   WBC 14.0 (*)    Neutro Abs 9.7 (*)    Monocytes Absolute 1.2 (*)    All other components within normal limits  COMPREHENSIVE METABOLIC PANEL - Abnormal; Notable for the following components:   Glucose, Bld 106 (*)    All other components within normal limits  URINALYSIS, ROUTINE W REFLEX MICROSCOPIC - Abnormal; Notable for the following components:   Color, Urine STRAW (*)    Hgb urine dipstick SMALL (*)    All other components within normal limits  URINALYSIS, MICROSCOPIC (REFLEX) - Abnormal; Notable for the following components:   Bacteria, UA RARE (*)    All other components within normal limits  LIPASE, BLOOD    EKG None  Radiology CT Renal Stone Study  Result Date: 09/08/2021 CLINICAL DATA:  Left lower abdominal pain, pelvic pain  EXAM: CT ABDOMEN AND PELVIS WITHOUT CONTRAST TECHNIQUE: Multidetector CT imaging of the abdomen and pelvis was performed following the standard protocol without IV contrast. COMPARISON:  07/07/2021 FINDINGS: Lower chest: No acute abnormality. Hepatobiliary: No focal hepatic abnormality. Gallbladder unremarkable. Pancreas: No focal abnormality or ductal dilatation. Spleen: Prior splenectomy Adrenals/Urinary Tract: 7 mm  nonobstructing stone in the upper pole of the left kidney. No ureteral stones or hydronephrosis. Adrenal glands and urinary bladder unremarkable. Stomach/Bowel: Sigmoid diverticulosis. Inflammatory stranding around the proximal sigmoid colon compatible with active diverticulitis. Normal appendix. Stomach and small bowel decompressed, unremarkable. Vascular/Lymphatic: Aortic atherosclerosis. Reproductive: 2.4 cm cyst in the right ovary. Uterus and left adnexa unremarkable. Other: Trace free fluid in the pelvis.  No free air. Musculoskeletal: No acute bony abnormality. IMPRESSION: Sigmoid diverticulosis with inflammatory stranding around the proximal sigmoid colon compatible with active diverticulitis. Left upper pole nephrolithiasis. No ureteral stones or hydronephrosis. Stable 2.4 cm right ovarian cyst. No follow-up recommended. This recommendation follows ACR consensus guidelines: White Paper of the ACR Incidental Findings Committee II on Vascular Findings. J Am Coll Radiol 2013; 10:789-794. Electronically Signed   By: Charlett Nose M.D.   On: 09/08/2021 09:29    Procedures Procedures   Medications Ordered in ED Medications  amoxicillin-clavulanate (AUGMENTIN) 875-125 MG per tablet 1 tablet (has no administration in time range)  morphine 4 MG/ML injection 4 mg (4 mg Intravenous Given 09/08/21 0903)  ondansetron (ZOFRAN) injection 4 mg (4 mg Intravenous Given 09/08/21 2334)    ED Course  I have reviewed the triage vital signs and the nursing notes.  Pertinent labs & imaging results that were available during my care of the patient were reviewed by me and considered in my medical decision making (see chart for details).    MDM Rules/Calculators/A&P                           55 yo F with a chief complaints of left lower quadrant abdominal pain.  This is been going on since yesterday had symptoms like this previously and had a negative CT scan and was treated presumptively for diverticulitis.  CT  stone study was ordered this time to closer evaluate for nephrolithiasis.  No kidney stone seen on CT scan but concern for diverticulitis.  No signs of complication.  Mild leukocytosis of 14,000.  Patient feeling better on reassessment tolerating p.o.  Will discharge home on oral antibiotics.  Encouraged her to follow-up with her gastroenterologist tomorrow.  9:45 AM:  I have discussed the diagnosis/risks/treatment options with the patient and family and believe the pt to be eligible for discharge home to follow-up with GI. We also discussed returning to the ED immediately if new or worsening sx occur. We discussed the sx which are most concerning (e.g., sudden worsening pain, fever, inability to tolerate by mouth) that necessitate immediate return. Medications administered to the patient during their visit and any new prescriptions provided to the patient are listed below.  Medications given during this visit Medications  amoxicillin-clavulanate (AUGMENTIN) 875-125 MG per tablet 1 tablet (has no administration in time range)  morphine 4 MG/ML injection 4 mg (4 mg Intravenous Given 09/08/21 0903)  ondansetron (ZOFRAN) injection 4 mg (4 mg Intravenous Given 09/08/21 3568)     The patient appears reasonably screen and/or stabilized for discharge and I doubt any other medical condition or other [facility] requiring further screening, evaluation, or treatment in the ED at this time prior to discharge.  Final Clinical Impression(s) / ED Diagnoses Final diagnoses:  Diverticulitis    Rx / DC Orders ED Discharge Orders          Ordered    morphine (MSIR) 15 MG tablet  Every 4 hours PRN        09/08/21 0940    amoxicillin-clavulanate (AUGMENTIN) 875-125 MG tablet  2 times daily        09/08/21 0940    ondansetron (ZOFRAN ODT) 4 MG disintegrating tablet        09/08/21 0940             Melene Plan, DO 09/08/21 [phone]

## 2021-09-08 NOTE — ED Triage Notes (Signed)
Sunday PM began having lower abd pain, primarily left groin area. Denies N/V/D. Has appt with GI MD tomorrow for evaluation of abd pain.

## 2021-09-08 NOTE — Discharge Instructions (Signed)
Follow-up with your gastroenterologist in the morning.  Please return for worsening pain inability to eat or drink or if you develop a fever.  Take 4 over the counter ibuprofen tablets 3 times a day or 2 over-the-counter naproxen tablets twice a day for pain. Also take tylenol 1000mg (2 extra strength) four times a day.   Then take the pain medicine if you feel like you need it. Narcotics do not help with the pain, they only make you care about it less.  You can become addicted to this, people may break into your house to steal it.  It will constipate you.  If you drive under the influence of this medicine you can get a DUI.

## 2021-09-09 ENCOUNTER — Telehealth: Payer: Self-pay

## 2021-09-09 ENCOUNTER — Ambulatory Visit (INDEPENDENT_AMBULATORY_CARE_PROVIDER_SITE_OTHER): Payer: BC Managed Care – PPO | Admitting: Gastroenterology

## 2021-09-09 ENCOUNTER — Encounter: Payer: Self-pay | Admitting: Gastroenterology

## 2021-09-09 VITALS — BP 112/74 | HR 86 | Ht 62.0 in | Wt 141.4 lb

## 2021-09-09 DIAGNOSIS — K5732 Diverticulitis of large intestine without perforation or abscess without bleeding: Secondary | ICD-10-CM | POA: Diagnosis not present

## 2021-09-09 MED ORDER — AMOXICILLIN-POT CLAVULANATE 875-125 MG PO TABS: 1.0000 | ORAL_TABLET | Freq: Two times a day (BID) | ORAL | 0 refills | Status: DC

## 2021-09-09 NOTE — Patient Instructions (Addendum)
If you are age 56 or older, your body mass index should be between 23-30. Your Body mass index is 25.86 kg/m. If this is out of the aforementioned range listed, please consider follow up with your Primary Care Provider.  If you are age 42 or younger, your body mass index should be between 19-25. Your Body mass index is 25.86 kg/m. If this is out of the aformentioned range listed, please consider follow up with your Primary Care Provider.   __________________________________________________________  The [place] [facility] providers would like to encourage you to use [facility] to communicate with providers for non-urgent requests or questions.  Due to long hold times on the telephone, sending your provider a message by [facility] may be a faster and more efficient way to get a response.  Please allow 48 business hours for a response.  Please remember that this is for non-urgent requests.   You have been scheduled for a colonoscopy. Please follow written instructions given to you at your visit today.  Please pick up your prep supplies at the pharmacy within the next 1-3 days. If you use inhalers (even only as needed), please bring them with you on the day of your procedure.  You will be contacted by our office prior to your procedure for directions on holding your Xarelto.  If you do not hear from our office 1 week prior to your scheduled procedure, please call [phone] to discuss.   We have sent the following medications to your pharmacy for you to pick up at your convenience: Augmentin 2 times a day for 7 days to continue course of medication.  Please call with ay update to the nurse on Friday to let us know how you feel.  Fax number is [phone] regarding [employer] paperwork.  Please call with any questions or concerns.  Thank you,  Dr. Lynann Bologna

## 2021-09-09 NOTE — Telephone Encounter (Signed)
Dr Nani Ravens,  Can you please advise if patient can hold her Xarelto 24 hours prior to her colonoscopy that is scheduled for 11-11-2021? Patient says you manage her blood thinner.     [facility] Pre-operative Risk Assessment     Request for surgical clearance:     Endoscopy Procedure  What type of surgery is being performed?     Colonoscopy  When is this surgery scheduled?     11-11-2021  What type of clearance is required ?   Pharmacy  Are there any medications that need to be held prior to surgery and how long? Xarelto 24 hours hold  Practice name and name of physician performing surgery?      [facility]  What is your office phone and fax number?      Phone- [phone]  [phone]  Anesthesia type (None, local, MAC, general) ?       MAC

## 2021-09-09 NOTE — Progress Notes (Signed)
Chief Complaint: LLQ pain  Referring Provider:  Sharlene Dory*      ASSESSMENT AND PLAN;   #1.  Acute sigmoid diverticulitis.  No definite abscess  Plan: -Augmentin 875 BID x 14 days (prescription given for 7 more days) -Continue zofran prn -Given MSO4 15 mg 1/2 tab Q6hrs prn by ED. Minimize pain meds.  -Low fiber diet with yogurt. -Call us on Friday. If not better, rpt CT AP with contrast. -Colon in 6-8 weeks, if no further complications, off Xarelto x 24 hours.  -Off work wed/thus/Friday -Discussed extensively with Pt and pt's husband.  HPI:    Cathy Simmons is a 55 y.o. female  Accompanied by her husband With H/O DVT on Xarelto, GERD, anxiety/depression, HLD  C/O LLQ pain since Sunday with chills.  No fever.  Mod.  She had 2 previous episodes last month which resolved on its own.  Seen in ED yesterday.  Underwent noncontrast CT Abdo/pelvis which showed diverticulitis.  CT was somewhat limited due to lack of IV contrast.  No definite abscess seen.  Her white cell count was elevated as well.  She was given Augmentin 875 twice daily x 7 days, Zofran for nausea, morphine sulfate 15 mg half a tablet every 6 hours as needed for abdominal pain.  Sent to GI clinic for emergency workin  Still has abdominal pain which is only slightly better.  She has only taken 1 dose of Augmentin.  She would normally have 3 BMs per day.  Last week she has been more constipated.  She denies use of nonsteroidals.  Nausea is better.  She denies having any upper abdominal pain, heartburn (on nexium), regurgitation.  History of colonoscopy for 15 to 20 years ago.  She does not recall where.  Past GI work-up:  CT AP with contrast 07/07/2021 -Stable nonobstructing left upper pole renal stone. -Diverticulosis without diverticulitis. -2.5 cm simple appearing cyst in the right adnexa. No follow-up imaging recommended.  CT renal stone study 09/08/2021 -Sigmoid diverticulosis with  inflammatory stranding around the proximal sigmoid colon compatible with active diverticulitis. -Left upper pole nephrolithiasis. No ureteral stones or hydronephrosis. -Stable 2.4 cm right ovarian cyst. No follow-up recommended.    No acute abnormality noted. Past Medical History:  Diagnosis Date   Anxiety    Asplenia    Depression    Family history of anesthesia complication    mother difficult to wake   GERD (gastroesophageal reflux disease)    History of blood clots    Hyperlipidemia     Past Surgical History:  Procedure Laterality Date   BREAST BIOPSY Right    needle core biopsy, benign   BREAST EXCISIONAL BIOPSY Left    2 times left breast, benign both times   BREAST SURGERY  breat biopsy x2 last 1995   DILATION AND CURETTAGE OF UTERUS  x2 1984 and 1991   excision left ganglion cyst Left    FOREIGN BODY REMOVAL Right 01/29/2013   Procedure: EXCISION FOREIGN BODY RIGHT SMALL FINGER;  Surgeon: Nicki Reaper, MD;  Location: [facility];  Service: Orthopedics;  Laterality: Right;   removal spleen  2000    Family History  Problem Relation Age of Onset   Stroke Mother    Cancer Mother        breast cancer   Breast cancer Mother    Diabetes Father    Parkinson's disease Father    Colon cancer Neg Hx    Rectal cancer Neg  Hx    Esophageal cancer Neg Hx     Social History   Tobacco Use   Smoking status: Former    Packs/day: 0.50    Types: Cigarettes    Quit date: 01/30/1999    Years since quitting: 22.6   Smokeless tobacco: Never  Substance Use Topics   Alcohol use: No   Drug use: No    Current Outpatient Medications  Medication Sig Dispense Refill   albuterol (VENTOLIN HFA) 108 (90 Base) MCG/ACT inhaler Inhale 2 puffs into the lungs every 6 (six) hours as needed for wheezing or shortness of breath. 6.7 g 0   amoxicillin-clavulanate (AUGMENTIN) 875-125 MG tablet Take 1 tablet by mouth 2 (two) times daily. One po bid x 7 days 14 tablet 0    cetirizine (ZYRTEC) 10 MG tablet Take 10 mg by mouth daily.     Cholecalciferol (VITAMIN D-3) 1000 units CAPS Take 1,000 Units by mouth daily.      clonazePAM (KLONOPIN) 0.5 MG tablet Take 1 tablet (0.5 mg total) by mouth 2 (two) times daily as needed. for anxiety 30 tablet 3   dicyclomine (BENTYL) 10 MG capsule Take 1 tab every 6 hours as needed for abdominal cramping. 60 capsule 0   esomeprazole (NEXIUM) 20 MG capsule Take 20 mg by mouth daily at 12 noon.     HYDROcodone-acetaminophen (NORCO/VICODIN) 5-325 MG tablet Take 1 tablet by mouth every 4 (four) hours as needed. 10 tablet 0   morphine (MSIR) 15 MG tablet Take 0.5 tablets (7.5 mg total) by mouth every 4 (four) hours as needed for severe pain. 5 tablet 0   ondansetron (ZOFRAN ODT) 4 MG disintegrating tablet 4mg  ODT q4 hours prn nausea/vomit 20 tablet 0   rivaroxaban (XARELTO) 20 MG TABS tablet Take 1 tablet (20 mg total) by mouth daily with supper. 90 tablet 3   rosuvastatin (CRESTOR) 20 MG tablet Take 1 tablet (20 mg total) by mouth daily. 90 tablet 3   vitamin B-12 (CYANOCOBALAMIN) 1000 MCG tablet Take 500-1,000 mcg by mouth daily.      No current facility-administered medications for this visit.    Allergies  Allergen Reactions   Bactrim [Sulfamethoxazole-Trimethoprim] Other (See Comments)    Sulfa causes yeast infections!   Adhesive [Tape] Rash and Other (See Comments)    Band-Aids   Cymbalta [Duloxetine Hcl] Anxiety    Review of Systems:  Constitutional: Denies fever, chills, diaphoresis, appetite change and fatigue.  HEENT: Denies photophobia, eye pain, redness, hearing loss, ear pain, congestion, sore throat, rhinorrhea, sneezing, mouth sores, neck pain, neck stiffness and tinnitus.   Respiratory: Denies SOB, DOE, cough, chest tightness,  and wheezing.   Cardiovascular: Denies chest pain, palpitations and leg swelling.  Genitourinary: Denies dysuria, urgency, frequency, hematuria, flank pain and difficulty urinating.   Musculoskeletal: Denies myalgias, back pain, joint swelling, arthralgias and gait problem.  Skin: No rash.  Neurological: Denies dizziness, seizures, syncope, weakness, light-headedness, numbness and headaches.  Hematological: Denies adenopathy. Has Easy bruising, personal or family bleeding history  Psychiatric/Behavioral: has anxiety or depression     Physical Exam:    BP 112/74   Pulse 86   Ht 5\' 2"  (1.575 m)   Wt 141 lb 6 oz (64.1 kg)   SpO2 96%   BMI 25.86 kg/m  Wt Readings from Last 3 Encounters:  09/09/21 141 lb 6 oz (64.1 kg)  09/08/21 143 lb (64.9 kg)  07/19/21 143 lb (64.9 kg)   Constitutional:  Well-developed, in no acute distress. Psychiatric:  Normal mood and affect. Behavior is normal. HEENT: Pupils normal.  Conjunctivae are normal. No scleral icterus. Cardiovascular: Normal rate, regular rhythm. No edema Pulmonary/chest: Effort normal and breath sounds normal. No wheezing, rales or rhonchi. Abdominal: Soft, nondistended. Mild LLQ tenderness without rebound. Bowel sounds active throughout. There are no masses palpable. No hepatomegaly. Rectal: Deferred Neurological: Alert and oriented to person place and time. Skin: Skin is warm and dry. No rashes noted.  Data Reviewed: I have personally reviewed following labs and imaging studies  CBC: CBC Latest Ref Rng & Units 09/08/2021 07/19/2021 07/07/2021  WBC 4.0 - 10.5 K/uL 14.0(H) 11.4(H) 13.8(H)  Hemoglobin 12.0 - 15.0 g/dL 93.2 67.1 24.5  Hematocrit 36.0 - 46.0 % 41.4 42.1 41.5  Platelets 150 - 400 K/uL 306 273 343    CMP: CMP Latest Ref Rng & Units 09/08/2021 07/19/2021 07/07/2021  Glucose 70 - 99 mg/dL 809(X) 833(A) 94  BUN 6 - 20 mg/dL 6 12 12   Creatinine 0.44 - 1.00 mg/dL 2.50 5.39  Sodium 135 - 145 mmol/L 140 139 139  Potassium 3.5 - 5.1 mmol/L 3.6 3.6 3.7  Chloride 98 - 111 mmol/L 105 102 103  CO2 22 - 32 mmol/L 27 27 27   Calcium 8.9 - 10.3 mg/dL 9.4 9.6 9.6  Total Protein 6.5 - 8.1 g/dL 7.5 - 7.7   Total Bilirubin 0.3 - 1.2 mg/dL 0.4 - 0.3  Alkaline Phos 38 - 126 U/L 99 - 88  AST 15 - 41 U/L 22 - 18  ALT 0 - 44 U/L 18 - 15    GFR: Estimated Creatinine Clearance: 69 mL/min (by C-G formula based on SCr of 0.56 mg/dL). Liver Function Tests: Recent Labs  Lab 09/08/21 0856  AST 22  ALT 18  ALKPHOS 99  BILITOT 0.4  PROT 7.5  ALBUMIN 3.8   Recent Labs  Lab 09/08/21 0856  LIPASE 20      Radiology Studies: CT Renal Stone Study  Result Date: 09/08/2021 CLINICAL DATA:  Left lower abdominal pain, pelvic pain EXAM: CT ABDOMEN AND PELVIS WITHOUT CONTRAST TECHNIQUE: Multidetector CT imaging of the abdomen and pelvis was performed following the standard protocol without IV contrast. COMPARISON:  07/07/2021 FINDINGS: Lower chest: No acute abnormality. Hepatobiliary: No focal hepatic abnormality. Gallbladder unremarkable. Pancreas: No focal abnormality or ductal dilatation. Spleen: Prior splenectomy Adrenals/Urinary Tract: 7 mm nonobstructing stone in the upper pole of the left kidney. No ureteral stones or hydronephrosis. Adrenal glands and urinary bladder unremarkable. Stomach/Bowel: Sigmoid diverticulosis. Inflammatory stranding around the proximal sigmoid colon compatible with active diverticulitis. Normal appendix. Stomach and small bowel decompressed, unremarkable. Vascular/Lymphatic: Aortic atherosclerosis. Reproductive: 2.4 cm cyst in the right ovary. Uterus and left adnexa unremarkable. Other: Trace free fluid in the pelvis.  No free air. Musculoskeletal: No acute bony abnormality. IMPRESSION: Sigmoid diverticulosis with inflammatory stranding around the proximal sigmoid colon compatible with active diverticulitis. Left upper pole nephrolithiasis. No ureteral stones or hydronephrosis. Stable 2.4 cm right ovarian cyst. No follow-up recommended. This recommendation follows ACR consensus guidelines: White Paper of the ACR Incidental Findings Committee II on Vascular Findings. J Am Coll  Radiol 2013; 10:789-794. Electronically Signed   By: 09/06/2021 M.D.   On: 09/08/2021 09:29      Charlett Nose, MD 09/09/2021, 10:22 AM  Cc: Edman Circle*

## 2021-09-10 NOTE — Telephone Encounter (Signed)
Patient made aware. Was told to call with any questions or concerns.

## 2021-09-10 NOTE — Telephone Encounter (Signed)
Yes, that is medically appropriate to hold for that length. Ty.

## 2021-09-11 ENCOUNTER — Telehealth: Payer: Self-pay | Admitting: Gastroenterology

## 2021-09-11 NOTE — Telephone Encounter (Signed)
Pt called in stating that she recently had an office visit on 09/11/2021 with Dr. Chales Abrahams. Pt states that's she is still having some abdominal pain in her lower left abdomen although it was not as bad as it was. Pt states that it hurts when she walks, takes a deep breath and turn over in sleep. Pt stating that she started having diarrhea starting yesterday evening. Pt states that she has had two diarrhea stools this AM. Pt states that she is eating yogurt. Please Advise

## 2021-09-11 NOTE — Telephone Encounter (Signed)
Inbound call from pt requesting a call back stating that she is still experiencing some pain and now she has diarrhea. Please advise. Thank you.

## 2021-09-16 NOTE — Telephone Encounter (Signed)
If still with abdominal pain,  check CBC, CMP and proceed with repeat CT Abdo/pelvis with p.o. and IV contrast. RE: acute sigmoid diverticulitis.  Rule out abscess If better, proceed with colonoscopy in 4 to 6 weeks as per last note. RG

## 2021-09-17 NOTE — Telephone Encounter (Signed)
Pt states that she is feeling better. Pt was reminded to continue to keep appointment for her Colonoscopy. Pt verbalized understanding with all questions answered.

## 2021-09-27 DIAGNOSIS — L0889 Other specified local infections of the skin and subcutaneous tissue: Secondary | ICD-10-CM | POA: Diagnosis not present

## 2021-09-27 DIAGNOSIS — S51812A Laceration without foreign body of left forearm, initial encounter: Secondary | ICD-10-CM | POA: Diagnosis not present

## 2021-09-28 ENCOUNTER — Telehealth: Payer: Self-pay | Admitting: Gastroenterology

## 2021-09-28 NOTE — Telephone Encounter (Signed)
Left message for pt to call back  °

## 2021-09-28 NOTE — Telephone Encounter (Signed)
Inbound call from pt requesting a call back stating that she was needing her FMLA papers by Monday. Please advise. Thank you.

## 2021-09-29 NOTE — Telephone Encounter (Signed)
Spoke with Pt Husband Lane. Cathy Simmons states that they have bad phone reception at his house and he has to walk to end of driveway to make calls. Pt checking up on FMLA paperwork.  [employer] office called and they stated that they do have the paperwork. Lane requested that paperwork be faxed by [employer] office. [place] Ana stated that she can fax FMLA documents as requested. Lane made aware.

## 2021-10-05 ENCOUNTER — Telehealth: Payer: Self-pay | Admitting: Family Medicine

## 2021-10-05 ENCOUNTER — Other Ambulatory Visit: Payer: Self-pay | Admitting: Family Medicine

## 2021-10-05 DIAGNOSIS — F419 Anxiety disorder, unspecified: Secondary | ICD-10-CM

## 2021-10-05 MED ORDER — CLONAZEPAM 0.5 MG PO TABS: 0.5000 mg | ORAL_TABLET | Freq: Two times a day (BID) | ORAL | 3 refills | Status: DC | PRN

## 2021-10-05 NOTE — Telephone Encounter (Signed)
Patient states she never got refills on her clonazepam and she waited to long to use the refills on the medication by the date on the bottle. She would like to know if she can get a refill for it.    Medication: clonazePAM (KLONOPIN) 0.5 MG tablet  Has the patient contacted their pharmacy? No. (If no, request that the patient contact the pharmacy for the refill.) (If yes, when and what did the pharmacy advise?)  Preferred Pharmacy (with phone number or street name): [facility] [address], [place] - [address]  [address], [place] [address]  Phone:  [phone]  Fax:  [phone]   Agent: Please be advised that RX refills may take up to 3 business days. We ask that you follow-up with your pharmacy.

## 2021-10-05 NOTE — Telephone Encounter (Signed)
Last OV--07/08/21 Last RF---#30 with 3 refills on 01/21/2021

## 2021-10-25 ENCOUNTER — Emergency Department (HOSPITAL_BASED_OUTPATIENT_CLINIC_OR_DEPARTMENT_OTHER): Payer: BC Managed Care – PPO

## 2021-10-25 ENCOUNTER — Other Ambulatory Visit: Payer: Self-pay

## 2021-10-25 ENCOUNTER — Emergency Department (HOSPITAL_BASED_OUTPATIENT_CLINIC_OR_DEPARTMENT_OTHER)
Admission: EM | Admit: 2021-10-25 | Discharge: 2021-10-25 | Disposition: A | Discharge status: Home / Self Care | Payer: BC Managed Care – PPO | Attending: Emergency Medicine | Admitting: Emergency Medicine

## 2021-10-25 ENCOUNTER — Encounter (HOSPITAL_BASED_OUTPATIENT_CLINIC_OR_DEPARTMENT_OTHER): Payer: Self-pay

## 2021-10-25 DIAGNOSIS — Z87891 Personal history of nicotine dependence: Secondary | ICD-10-CM | POA: Diagnosis not present

## 2021-10-25 DIAGNOSIS — N200 Calculus of kidney: Secondary | ICD-10-CM | POA: Diagnosis not present

## 2021-10-25 DIAGNOSIS — R1012 Left upper quadrant pain: Secondary | ICD-10-CM

## 2021-10-25 DIAGNOSIS — K5730 Diverticulosis of large intestine without perforation or abscess without bleeding: Secondary | ICD-10-CM | POA: Diagnosis not present

## 2021-10-25 DIAGNOSIS — J9811 Atelectasis: Secondary | ICD-10-CM | POA: Diagnosis not present

## 2021-10-25 DIAGNOSIS — I700 Atherosclerosis of aorta: Secondary | ICD-10-CM | POA: Diagnosis not present

## 2021-10-25 DIAGNOSIS — R0789 Other chest pain: Secondary | ICD-10-CM | POA: Diagnosis not present

## 2021-10-25 DIAGNOSIS — N83201 Unspecified ovarian cyst, right side: Secondary | ICD-10-CM | POA: Diagnosis not present

## 2021-10-25 HISTORY — DX: Diverticulitis of intestine, part unspecified, without perforation or abscess without bleeding: K57.92

## 2021-10-25 LAB — TROPONIN I (HIGH SENSITIVITY)
Troponin I (High Sensitivity): <2 ng/L (ref <18)
Troponin I (High Sensitivity): <2 ng/L (ref <18)

## 2021-10-25 LAB — URINALYSIS, MICROSCOPIC (REFLEX)

## 2021-10-25 LAB — COMPREHENSIVE METABOLIC PANEL
ALT: 20 U/L (ref 0–44)
AST: 22 U/L (ref 15–41)
Albumin: 4.2 g/dL (ref 3.5–5.0)
Alkaline Phosphatase: 90 U/L (ref 38–126)
Anion gap: 6 (ref 5–15)
BUN: 15 mg/dL (ref 6–20)
CO2: 28 mmol/L (ref 22–32)
Calcium: 9.7 mg/dL (ref 8.9–10.3)
Chloride: 106 mmol/L (ref 98–111)
Creatinine, Ser: 0.55 mg/dL (ref 0.44–1.00)
GFR, Estimated: >60 mL/min (ref >60)
Glucose, Bld: 100 mg/dL — ABNORMAL HIGH (ref 70–99)
Potassium: 4 mmol/L (ref 3.5–5.1)
Sodium: 140 mmol/L (ref 135–145)
Total Bilirubin: 0.5 mg/dL (ref 0.3–1.2)
Total Protein: 8 g/dL (ref 6.5–8.1)

## 2021-10-25 LAB — URINALYSIS, ROUTINE W REFLEX MICROSCOPIC
Bilirubin Urine: NEGATIVE
Glucose, UA: NEGATIVE mg/dL
Ketones, ur: NEGATIVE mg/dL
Leukocytes,Ua: NEGATIVE
Nitrite: NEGATIVE
Protein, ur: NEGATIVE mg/dL
Specific Gravity, Urine: 1.025 (ref 1.005–1.030)
pH: 7 (ref 5.0–8.0)

## 2021-10-25 LAB — CBC
HCT: 42.5 % (ref 36.0–46.0)
Hemoglobin: 14 g/dL (ref 12.0–15.0)
MCH: 30.8 pg (ref 26.0–34.0)
MCHC: 32.9 g/dL (ref 30.0–36.0)
MCV: 93.6 fL (ref 80.0–100.0)
Platelets: 289 10*3/uL (ref 150–400)
RBC: 4.54 MIL/uL (ref 3.87–5.11)
RDW: 13.6 % (ref 11.5–15.5)
WBC: 8.7 10*3/uL (ref 4.0–10.5)
nRBC: 0 % (ref 0.0–0.2)

## 2021-10-25 LAB — LIPASE, BLOOD: Lipase: 21 U/L (ref 11–51)

## 2021-10-25 LAB — PREGNANCY, URINE: Preg Test, Ur: NEGATIVE

## 2021-10-25 MED ORDER — LIDOCAINE 5 % EX OINT: 1.0000 "application " | TOPICAL_OINTMENT | Freq: Three times a day (TID) | CUTANEOUS | 0 refills | Status: DC | PRN

## 2021-10-25 MED ORDER — IOHEXOL 350 MG/ML SOLN
100.0000 mL | Freq: Once | INTRAVENOUS | Status: AC | PRN
  Administered 2021-10-25: 20:00:00 100 mL via INTRAVENOUS

## 2021-10-25 MED ORDER — ONDANSETRON 4 MG PO TBDP
4.0000 mg | ORAL_TABLET | Freq: Once | ORAL | Status: AC | PRN
  Administered 2021-10-25: 21:00:00 4 mg via ORAL
  Filled 2021-10-25: qty 1

## 2021-10-25 MED ORDER — OXYCODONE-ACETAMINOPHEN 5-325 MG PO TABS
1.0000 | ORAL_TABLET | Freq: Once | ORAL | Status: AC
  Administered 2021-10-25: 21:00:00 1 via ORAL
  Filled 2021-10-25: qty 1

## 2021-10-25 NOTE — ED Notes (Signed)
Patient transported to CT 

## 2021-10-25 NOTE — ED Provider Notes (Signed)
Emergency Department Provider Note   I have reviewed the triage vital signs and the nursing notes.   HISTORY  Chief Complaint Abdominal Pain   HPI Cathy Simmons is a 55 y.o. female presents to the ED with LUQ abdomina/lower chest pain worsening over the last 2 days. Patient does not recall injury. No SOB but pain is mainly over the left lower/anterior ribs. Pain is worse with touching. No rash. No vomiting. No lower abdominal pain. Not worse with eating. No similar pain in the past.    Past Medical History:  Diagnosis Date   Anxiety    Asplenia    Depression    Diverticulitis    Family history of anesthesia complication    mother difficult to wake   GERD (gastroesophageal reflux disease)    History of blood clots    Hyperlipidemia     Patient Active Problem List   Diagnosis Date Noted   Bronchitis 04/03/2021   History of cerebral venous sinus thrombosis 11/17/2020   Pure hypercholesterolemia 11/17/2020   Family history of breast cancer 05/26/2020   GAD (generalized anxiety disorder) 03/28/2020   Depression, major, single episode, moderate ([place]) 03/28/2020   Bilateral plantar fasciitis 09/03/2019   OAB (overactive bladder) 09/03/2019   Cerebral venous sinus thrombosis 01/24/2019   Dislocation of jaw 05/15/2018   TMJ syndrome 05/15/2018   Asplenia    Anxiety and depression     Past Surgical History:  Procedure Laterality Date   BREAST BIOPSY Right    needle core biopsy, benign   BREAST EXCISIONAL BIOPSY Left    2 times left breast, benign both times   BREAST SURGERY  breat biopsy x2 last [place]  x2 1984 and 1991   excision left ganglion cyst Left    FOREIGN BODY REMOVAL Right 01/29/2013   Procedure: EXCISION FOREIGN BODY RIGHT SMALL FINGER;  Surgeon: Wynonia Sours, MD;  Location: [facility];  Service: Orthopedics;  Laterality: Right;   removal spleen  2000    Allergies Bactrim  [sulfamethoxazole-trimethoprim], Adhesive [tape], and Cymbalta [duloxetine hcl]  Family History  Problem Relation Age of Onset   Stroke Mother    Cancer Mother        breast cancer   Breast cancer Mother    Diabetes Father    Parkinson's disease Father    Colon cancer Neg Hx    Rectal cancer Neg Hx    Esophageal cancer Neg Hx     Social History Social History   Tobacco Use   Smoking status: Former    Packs/day: 0.50    Types: Cigarettes    Quit date: 01/30/1999    Years since quitting: 22.7   Smokeless tobacco: Never  Substance Use Topics   Alcohol use: No   Drug use: No    Review of Systems  Constitutional: No fever/chills Eyes: No visual changes. ENT: No sore throat. Cardiovascular: Positive chest pain. Respiratory: Denies shortness of breath. Gastrointestinal: Positive abdominal pain.  No nausea, no vomiting.  No diarrhea.  No constipation. Genitourinary: Negative for dysuria. Musculoskeletal: Negative for back pain. Skin: Negative for rash. Neurological: Negative for headaches, focal weakness or numbness.  10-point ROS otherwise negative.  ____________________________________________   PHYSICAL EXAM:  VITAL SIGNS: ED Triage Vitals  Enc Vitals Group     BP 10/25/21 1522 106/78     Pulse Rate 10/25/21 1522 78     Resp 10/25/21 1522 18     Temp  10/25/21 1522 98.3 F (36.8 C)     Temp Source 10/25/21 1522 Oral     SpO2 10/25/21 1522 97 %     Weight 10/25/21 1515 139 lb (63 kg)     Height 10/25/21 1515 5\' 2"  (1.575 m)   Constitutional: Alert and oriented. Well appearing and in no acute distress. Eyes: Conjunctivae are normal.  Head: Atraumatic. Nose: No congestion/rhinnorhea. Mouth/Throat: Mucous membranes are moist.  Neck: No stridor.   Cardiovascular: Normal rate, regular rhythm. Good peripheral circulation. Grossly normal heart sounds.   Respiratory: Normal respiratory effort.  No retractions. Lungs CTAB. Gastrointestinal: Soft and nontender. No  distention.  Musculoskeletal: No lower extremity tenderness nor edema. No gross deformities of extremities. Tenderness to palpation over the left anterior/inferior ribs without overlying rash.  Neurologic:  Normal speech and language. No gross focal neurologic deficits are appreciated.  Skin:  Skin is warm, dry and intact. No rash noted.   ____________________________________________   LABS (all labs ordered are listed, but only abnormal results are displayed)  Labs Reviewed  COMPREHENSIVE METABOLIC PANEL - Abnormal; Notable for the following components:      Result Value   Glucose, Bld 100 (*)    All other components within normal limits  URINALYSIS, ROUTINE W REFLEX MICROSCOPIC - Abnormal; Notable for the following components:   Hgb urine dipstick MODERATE (*)    All other components within normal limits  URINALYSIS, MICROSCOPIC (REFLEX) - Abnormal; Notable for the following components:   Bacteria, UA MANY (*)    All other components within normal limits  LIPASE, BLOOD  CBC  PREGNANCY, URINE  TROPONIN I (HIGH SENSITIVITY)  TROPONIN I (HIGH SENSITIVITY)   ____________________________________________  EKG   EKG Interpretation  Date/Time:  Sunday October 25 2021 15:45:48 EST Ventricular Rate:  81 PR Interval:  136 QRS Duration: 96 QT Interval:  374 QTC Calculation: 434 R Axis:   36 Text Interpretation: Normal sinus rhythm Nonspecific ST and T wave abnormality Abnormal ECG Confirmed by 03-07[birth year]1980 [phone]) on 10/25/2021 4:01:28 PM        ____________________________________________  RADIOLOGY  CT Angio Chest PE W and/or Wo Contrast  Result Date: 10/25/2021 CLINICAL DATA:  PE suspected, high prob; Abdominal pain, acute, nonlocalized LUQ pain EXAM: CT ANGIOGRAPHY CHEST CT ABDOMEN AND PELVIS WITH CONTRAST TECHNIQUE: Multidetector CT imaging of the chest was performed using the standard protocol during bolus administration of intravenous contrast. Multiplanar CT image  reconstructions and MIPs were obtained to evaluate the vascular anatomy. Multidetector CT imaging of the abdomen and pelvis was performed using the standard protocol during bolus administration of intravenous contrast. CONTRAST:  10/27/2021 OMNIPAQUE IOHEXOL 350 MG/ML SOLN COMPARISON:  None. FINDINGS: CTA CHEST FINDINGS Cardiovascular: Satisfactory opacification of the pulmonary arteries to the segmental level. No evidence of pulmonary embolism. Normal heart size. No significant pericardial effusion. The thoracic aorta is normal in caliber. No atherosclerotic plaque of the thoracic aorta. No coronary artery calcifications. Mediastinum/Nodes: No enlarged mediastinal, hilar, or axillary lymph nodes. Thyroid gland, trachea, and esophagus demonstrate no significant findings. Lungs/Pleura: Bilateral lower lobe atelectasis. No focal consolidation. No pulmonary nodule. No pulmonary mass. No pleural effusion. No pneumothorax. Musculoskeletal: No chest wall abnormality. No suspicious lytic or blastic osseous lesions. No acute displaced fracture. Review of the MIP images confirms the above findings. CT ABDOMEN and PELVIS FINDINGS Hepatobiliary: No focal liver abnormality. No gallstones, gallbladder wall thickening, or pericholecystic fluid. No biliary dilatation. Pancreas: No focal lesion. Normal pancreatic contour. No surrounding inflammatory changes. No main  pancreatic ductal dilatation. Spleen: Status post splenectomy. Adrenals/Urinary Tract: No adrenal nodule bilaterally. Bilateral kidneys enhance symmetrically. 5 mm calcified stone within the left kidney. Several other punctate stones associated with this. No hydronephrosis. No hydroureter. The urinary bladder is unremarkable. On delayed imaging, there is no urothelial wall thickening and there are no filling defects in the opacified portions of the bilateral collecting systems or ureters. Stomach/Bowel: Stomach is within normal limits. No evidence of bowel wall thickening or  dilatation. Scattered colonic diverticulosis with no acute diverticulitis. Appendix appears normal. Vascular/Lymphatic: No abdominal aorta or iliac aneurysm. Mild atherosclerotic plaque of the aorta and its branches. No abdominal, pelvic, or inguinal lymphadenopathy. Reproductive: There is a 2.5 cm right ovarian simple cystic lesion. Uterus and bilateral adnexa are unremarkable. Other: No intraperitoneal free fluid. No intraperitoneal free gas. No organized fluid collection. Musculoskeletal: No chest wall abnormality. No suspicious lytic or blastic osseous lesions. No acute displaced fracture. L5-S1 endplate sclerosis. Review of the MIP images confirms the above findings. IMPRESSION: 1. No pulmonary embolus. 2. No acute intrathoracic abnormality. 3. Nonobstructive 5 mm left nephrolithiasis. 4. Scattered colonic diverticulosis with no acute diverticulitis. 5. A 2.5 cm right ovarian cystic lesion. No follow-up imaging recommended. Note: This recommendation does not apply to premenarchal patients and to those with increased risk (genetic, family history, elevated tumor markers or other high-risk factors) of ovarian cancer. Reference: JACR 2020 Feb; 17(2):248-254 Electronically Signed   By: Iven Finn M.D.   On: 10/25/2021 20:41   CT ABDOMEN PELVIS W CONTRAST  Result Date: 10/25/2021 CLINICAL DATA:  PE suspected, high prob; Abdominal pain, acute, nonlocalized LUQ pain EXAM: CT ANGIOGRAPHY CHEST CT ABDOMEN AND PELVIS WITH CONTRAST TECHNIQUE: Multidetector CT imaging of the chest was performed using the standard protocol during bolus administration of intravenous contrast. Multiplanar CT image reconstructions and MIPs were obtained to evaluate the vascular anatomy. Multidetector CT imaging of the abdomen and pelvis was performed using the standard protocol during bolus administration of intravenous contrast. CONTRAST:  130mL OMNIPAQUE IOHEXOL 350 MG/ML SOLN COMPARISON:  None. FINDINGS: CTA CHEST FINDINGS  Cardiovascular: Satisfactory opacification of the pulmonary arteries to the segmental level. No evidence of pulmonary embolism. Normal heart size. No significant pericardial effusion. The thoracic aorta is normal in caliber. No atherosclerotic plaque of the thoracic aorta. No coronary artery calcifications. Mediastinum/Nodes: No enlarged mediastinal, hilar, or axillary lymph nodes. Thyroid gland, trachea, and esophagus demonstrate no significant findings. Lungs/Pleura: Bilateral lower lobe atelectasis. No focal consolidation. No pulmonary nodule. No pulmonary mass. No pleural effusion. No pneumothorax. Musculoskeletal: No chest wall abnormality. No suspicious lytic or blastic osseous lesions. No acute displaced fracture. Review of the MIP images confirms the above findings. CT ABDOMEN and PELVIS FINDINGS Hepatobiliary: No focal liver abnormality. No gallstones, gallbladder wall thickening, or pericholecystic fluid. No biliary dilatation. Pancreas: No focal lesion. Normal pancreatic contour. No surrounding inflammatory changes. No main pancreatic ductal dilatation. Spleen: Status post splenectomy. Adrenals/Urinary Tract: No adrenal nodule bilaterally. Bilateral kidneys enhance symmetrically. 5 mm calcified stone within the left kidney. Several other punctate stones associated with this. No hydronephrosis. No hydroureter. The urinary bladder is unremarkable. On delayed imaging, there is no urothelial wall thickening and there are no filling defects in the opacified portions of the bilateral collecting systems or ureters. Stomach/Bowel: Stomach is within normal limits. No evidence of bowel wall thickening or dilatation. Scattered colonic diverticulosis with no acute diverticulitis. Appendix appears normal. Vascular/Lymphatic: No abdominal aorta or iliac aneurysm. Mild atherosclerotic plaque of the aorta and its  branches. No abdominal, pelvic, or inguinal lymphadenopathy. Reproductive: There is a 2.5 cm right ovarian  simple cystic lesion. Uterus and bilateral adnexa are unremarkable. Other: No intraperitoneal free fluid. No intraperitoneal free gas. No organized fluid collection. Musculoskeletal: No chest wall abnormality. No suspicious lytic or blastic osseous lesions. No acute displaced fracture. L5-S1 endplate sclerosis. Review of the MIP images confirms the above findings. IMPRESSION: 1. No pulmonary embolus. 2. No acute intrathoracic abnormality. 3. Nonobstructive 5 mm left nephrolithiasis. 4. Scattered colonic diverticulosis with no acute diverticulitis. 5. A 2.5 cm right ovarian cystic lesion. No follow-up imaging recommended. Note: This recommendation does not apply to premenarchal patients and to those with increased risk (genetic, family history, elevated tumor markers or other high-risk factors) of ovarian cancer. Reference: JACR 2020 Feb; 17(2):248-254 Electronically Signed   By: Iven Finn M.D.   On: 10/25/2021 20:41    ____________________________________________   PROCEDURES  Procedure(s) performed:   Procedures  None ____________________________________________   INITIAL IMPRESSION / ASSESSMENT AND PLAN / ED COURSE  Pertinent labs & imaging results that were available during my care of the patient were reviewed by me and considered in my medical decision making (see chart for details).   Patient presents to the ED with chest/abdominal pain.   Differential includes all life-threatening causes for chest pain. This includes but is not exclusive to acute coronary syndrome, aortic dissection, pulmonary embolism, cardiac tamponade, community-acquired pneumonia, pericarditis, musculoskeletal chest wall pain, etc.  Exam not consistent with Zoster. No injury history. CTA and CT abdomen pelvis with no acute process. Ovarian cyst noted but no follow up recommended. Labs reviewed with no acute findings. Plan for topical pain medications and PCP follow up. Work note provided.    ____________________________________________  FINAL CLINICAL IMPRESSION(S) / ED DIAGNOSES  Final diagnoses:  Atypical chest pain  LUQ abdominal pain     MEDICATIONS GIVEN DURING THIS VISIT:  Medications  ondansetron (ZOFRAN-ODT) disintegrating tablet 4 mg (4 mg Oral Given 10/25/21 2114)  iohexol (OMNIPAQUE) 350 MG/ML injection 100 mL (100 mLs Intravenous Contrast Given 10/25/21 2006)  oxyCODONE-acetaminophen (PERCOCET/ROXICET) 5-325 MG per tablet 1 tablet (1 tablet Oral Given 10/25/21 2114)     NEW OUTPATIENT MEDICATIONS STARTED DURING THIS VISIT:  Discharge Medication List as of 10/25/2021  9:54 PM     START taking these medications   Details  lidocaine (XYLOCAINE) 5 % ointment Apply 1 application topically 3 (three) times daily as needed for moderate pain., Starting Sun 10/25/2021, Print        Note:  This document was prepared using Dragon voice recognition software and may include unintentional dictation errors.  Nanda Quinton, MD, [facility] Emergency Medicine    Junaid Wurzer, [facility], MD 10/26/21 1255

## 2021-10-25 NOTE — Discharge Instructions (Signed)

## 2021-10-25 NOTE — ED Triage Notes (Signed)
LUQ pain radiating to back x2 days. Pt reports that it is tender to touch.

## 2021-10-27 ENCOUNTER — Encounter: Payer: Self-pay | Admitting: Family Medicine

## 2021-10-27 ENCOUNTER — Ambulatory Visit: Payer: BC Managed Care – PPO | Admitting: Family Medicine

## 2021-10-27 ENCOUNTER — Other Ambulatory Visit: Payer: Self-pay

## 2021-10-27 VITALS — BP 120/71 | HR 79 | Temp 98.1°F | Ht 62.0 in | Wt 138.0 lb

## 2021-10-27 DIAGNOSIS — R0789 Other chest pain: Secondary | ICD-10-CM | POA: Diagnosis not present

## 2021-10-27 MED ORDER — PREDNISONE 20 MG PO TABS: 40.0000 mg | ORAL_TABLET | Freq: Every day | ORAL | 0 refills | Status: AC

## 2021-10-27 MED ORDER — TIZANIDINE HCL 4 MG PO TABS: 4.0000 mg | ORAL_TABLET | Freq: Four times a day (QID) | ORAL | 0 refills | Status: DC | PRN

## 2021-10-27 NOTE — Progress Notes (Signed)
Musculoskeletal Exam  Patient: Cathy Simmons DOB: 1966/06/10  DOS: 10/27/2021  SUBJECTIVE:  Chief Complaint:   Chief Complaint  Patient presents with   Follow-up    Cathy Simmons is a 55 y.o.  female for evaluation and treatment of chest wall pain.   Onset:  almost 1 week ago. No inj or change in activity.  Location: L lower chest Character:  aching and sharp  Progression of issue:  has worsened Associated symptoms: no rashes, had neg workup in ED No bruising, swelling, redness Treatment: to date has been topical lidocaine, Tylenol (is on Eliquis), opiates. None were particularly helpful.    Neurovascular symptoms: no  Past Medical History:  Diagnosis Date   Anxiety    Asplenia    Depression    Diverticulitis    Family history of anesthesia complication    mother difficult to wake   GERD (gastroesophageal reflux disease)    History of blood clots    Hyperlipidemia     Objective: VITAL SIGNS: BP 120/71   Pulse 79   Temp 98.1 F (36.7 C) (Oral)   Ht 5\' 2"  (1.575 m)   Wt 138 lb (62.6 kg)   SpO2 97%   BMI 25.24 kg/m  Constitutional: Well formed, well developed. No acute distress. Thorax & Lungs: No accessory muscle use Musculoskeletal: L chest wall.   Tenderness to palpation: yes over lower L rib cage and lower thor parasp msc on L Deformity: no Ecchymosis: no Neurologic: Normal sensory function Psychiatric: Normal mood. Age appropriate judgment and insight. Alert & oriented x 3.    Assessment:  Left-sided chest wall pain - Plan: tiZANidine (ZANAFLEX) 4 MG tablet, predniSONE (DELTASONE) 20 MG tablet, Ambulatory referral to Physical Therapy  Plan: Stretches/exercises, heat, ice, Tylenol. PT, pred burst as she is on Eliquis. F/u prn. The patient voiced understanding and agreement to the plan.   [place], DO 10/27/21  10:15 AM

## 2021-10-27 NOTE — Patient Instructions (Addendum)
OK to take Tylenol 1000 mg (2 extra strength tabs) or 975 mg (3 regular strength tabs) every 6 hours as needed.  Ice/cold pack over area for 10-15 min twice daily.  Heat (pad or rice pillow in microwave) over affected area, 10-15 minutes twice daily.   If you do not hear anything about your referral in the next 1-2 weeks, call our office and ask for an update.  Let us know if you need anything.   Mid-Back Strain Rehab It is normal to feel mild stretching, pulling, tightness, or discomfort as you do these exercises, but you should stop right away if you feel sudden pain or your pain gets worse.   Stretching and range of motion exercises This exercise warms up your muscles and joints and improves the movement and flexibility of your back and shoulders. This exercise also help to relieve pain. Exercise A: Chest and spine stretch    Lie down on your back on a firm surface. Roll a towel or a small blanket so it is about 4 inches (10 cm) in diameter. Put the towel lengthwise under the middle of your back so it is under your spine, but not under your shoulder blades. To increase the stretch, you may put your hands behind your head and let your elbows fall to your sides. Hold for 30 seconds. Repeat exercise 2 times. Complete this exercise 3 times per week.  Strengthening exercises These exercises build strength and endurance in your back and your shoulder blade muscles. Endurance is the ability to use your muscles for a long time, even after they get tired. Exercise B: Alternating arm and leg raises    Get on your hands and knees on a firm surface. If you are on a hard floor, you may want to use padding to cushion your knees, such as an exercise mat. Line up your arms and legs. Your hands should be below your shoulders, and your knees should be below your hips. Lift your left leg behind you. At the same time, raise your right arm and straighten it in front of you. Do not lift your leg  higher than your hip. Do not lift your arm higher than your shoulder. Keep your abdominal and back muscles tight. Keep your hips facing the ground. Do not arch your back. Keep your balance carefully, and do not hold your breath. Hold for 3 seconds. Slowly return to the starting position and repeat with your right leg and your left arm. Repeat 2 times. Complete this exercise 3 times per week. Exercise C: Straight arm rows (shoulder extension)     Stand with your feet shoulder width apart. Secure an exercise band to a stable object in front of you so the band is at or above shoulder height. Hold one end of the exercise band in each hand. Straighten your elbows and lift your hands up to shoulder height. Step back, away from the secured end of the exercise band, until the band stretches. Squeeze your shoulder blades together and pull your hands down to the sides of your thighs. Stop when your hands are straight down by your sides. Do not let your hands go behind your body. Hold for 3 seconds. Slowly return to the starting position. Repeat 2 times. Complete this exercise 3 times per week. Exercise D: Shoulder external rotation, prone Lie on your abdomen on a firm bed so your left / right forearm hangs over the edge of the bed and your upper arm is on  the bed, straight out from your body. Your elbow should be bent. Your palm should be facing your feet. If instructed, hold a 5 lb weight in your hand. Squeeze your shoulder blade toward the middle of your back. Do not let your shoulder lift toward your ear. Keep your elbow bent in an "L" shape (90 degrees) while you slowly move your forearm up toward the ceiling. Move your forearm up to the height of the bed, toward your head. Your upper arm should not move. At the top of the movement, your palm should face the floor. Hold for 3 seconds. Slowly return to the starting position and relax your muscles. Repeat 3 times. Complete this exercise 3  times per week. Exercise E: Scapular retraction and external rotation, rowing    Sit in a stable chair without armrests, or stand. Secure an exercise band to a stable object in front of you so it is at shoulder height. Hold one end of the exercise band in each hand. Bring your arms out straight in front of you. Step back, away from the secured end of the exercise band, until the band stretches. Pull the band backward. As you do this, bend your elbows and squeeze your shoulder blades together, but avoid letting the rest of your body move. Do not let your shoulders lift up toward your ears. Stop when your elbows are at your sides or slightly behind your body. Hold for 5 seconds. Slowly straighten your arms to return to the starting position. Repeat 2 times. Complete this exercise 3 times per week. Posture and body mechanics    Body mechanics refers to the movements and positions of your body while you do your daily activities. Posture is part of body mechanics. Good posture and healthy body mechanics can help to relieve stress in your body's tissues and joints. Good posture means that your spine is in its natural S-curve position (your spine is neutral), your shoulders are pulled back slightly, and your head is not tipped forward. The following are general guidelines for applying improved posture and body mechanics to your everyday activities. Standing    When standing, keep your spine neutral and your feet about hip-width apart. Keep a slight bend in your knees. Your ears, shoulders, and hips should line up. When you do a task in which you lean forward while standing in one place for a long time, place one foot up on a stable object that is 2-4 inches (5-10 cm) high, such as a footstool. This helps keep your spine neutral. Sitting    When sitting, keep your spine neutral and keep your feet flat on the floor. Use a footrest, if necessary, and keep your thighs parallel to the floor. Avoid  rounding your shoulders, and avoid tilting your head forward. When working at a desk or a computer, keep your desk at a height where your hands are slightly lower than your elbows. Slide your chair under your desk so you are close enough to maintain good posture. When working at a computer, place your monitor at a height where you are looking straight ahead and you do not have to tilt your head forward or downward to look at the screen. Resting    When lying down and resting, avoid positions that are most painful for you. If you have pain with activities such as sitting, bending, stooping, or squatting (flexion-based activities), lie in a position in which your body does not bend very much. For example, avoid curling up  on your side with your arms and knees near your chest (fetal position). If you have pain with activities such as standing for a long time or reaching with your arms (extension-based activities), lie with your spine in a neutral position and bend your knees slightly. Try the following positions: Lying on your side with a pillow between your knees. Lying on your back with a pillow under your knees.   Lifting    When lifting objects, keep your feet at least shoulder-width apart and tighten your abdominal muscles. Bend your knees and hips and keep your spine neutral. It is important to lift using the strength of your legs, not your back. Do not lock your knees straight out. Always ask for help to lift heavy or awkward objects. Make sure you discuss any questions you have with your health care provider.

## 2021-10-27 NOTE — Addendum Note (Signed)
Addended by: Scharlene Gloss B on: 10/27/2021 10:28 AM   Modules accepted: Orders

## 2021-10-28 ENCOUNTER — Telehealth: Payer: Self-pay | Admitting: Family Medicine

## 2021-10-28 NOTE — Telephone Encounter (Signed)
Pt dropped off FMLA forms to be signed   Placed into wendling bin up front  Pt would like papers to be faxed to [phone]

## 2021-10-28 NOTE — Telephone Encounter (Signed)
Faxed form.

## 2021-11-02 ENCOUNTER — Ambulatory Visit: Payer: BC Managed Care – PPO | Attending: Family Medicine | Admitting: Physical Therapy

## 2021-11-02 ENCOUNTER — Other Ambulatory Visit: Payer: Self-pay

## 2021-11-02 ENCOUNTER — Encounter: Payer: Self-pay | Admitting: Physical Therapy

## 2021-11-02 DIAGNOSIS — R0789 Other chest pain: Secondary | ICD-10-CM | POA: Diagnosis not present

## 2021-11-02 DIAGNOSIS — M546 Pain in thoracic spine: Secondary | ICD-10-CM | POA: Diagnosis not present

## 2021-11-02 NOTE — Therapy (Signed)
[facility] [address] [place], [place], [address] Phone: [phone]   Fax:  [phone]  Physical Therapy Evaluation  Patient Details  Name: Cathy Simmons MRN: [id number] Date of Birth: 1966-10-24 Referring Provider (PT): Arva Chafe   Encounter Date: 11/02/2021   PT End of Session - 11/02/21 1022     Visit Number 1    Number of Visits 12    Date for PT Re-Evaluation 12/14/21    Authorization Type FOTO.    PT Start Time 0909    PT Stop Time 0951    PT Time Calculation (min) 42 min    Activity Tolerance Patient tolerated treatment well    Behavior During Therapy WFL for tasks assessed/performed             Past Medical History:  Diagnosis Date   Anxiety    Asplenia    Depression    Diverticulitis    Family history of anesthesia complication    mother difficult to wake   GERD (gastroesophageal reflux disease)    History of blood clots    Hyperlipidemia     Past Surgical History:  Procedure Laterality Date   BREAST BIOPSY Right    needle core biopsy, benign   BREAST EXCISIONAL BIOPSY Left    2 times left breast, benign both times   BREAST SURGERY  breat biopsy x2 last 1995   DILATION AND CURETTAGE OF UTERUS  x2 1984 and 1991   excision left ganglion cyst Left    FOREIGN BODY REMOVAL Right 01/29/2013   Procedure: EXCISION FOREIGN BODY RIGHT SMALL FINGER;  Surgeon: Nicki Reaper, MD;  Location: [facility];  Service: Orthopedics;  Laterality: Right;   removal spleen  2000    There were no vitals filed for this visit.    Subjective Assessment - 11/02/21 1040     Subjective COVID-19 screen performed prior to patient entering clinic.  The patient presents to the clinic today with c/o left-sided mid-back pain and pain radiation to her left anterior chest wall.  The pain was so intense that she presented to the ED on 10/25/21.  A cardiac event was ruled out.  Her resting pain-level is a 4/10.   Lying down, deep breathing, lifting, bending and sitting too long increase her pain to much higher levels.  She is not able to do housework like she was due to intense pain.  Her sleep is disturbed due to pain.    Pertinent History Op (patient reported), h/o knee pain.    How long can you sit comfortably? Varies.    How long can you stand comfortably? Varies.    Patient Stated Goals Get out of pain    Currently in Pain? Yes    Pain Score 4     Pain Location Back    Pain Orientation Left    Pain Descriptors / Indicators Aching;Sharp    Pain Type Acute pain    Pain Radiating Towards left anterior chest wall.    Pain Onset 1 to 4 weeks ago    Aggravating Factors  See above.    Pain Relieving Factors See above.                [facility] PT Assessment - 11/02/21 0001       Assessment   Medical Diagnosis Left-sided chest wall pain    Referring Provider (PT) Arva Chafe    Onset Date/Surgical Date 10/24/21      Precautions  Precaution Comments OP (patient reported).      Restrictions   Weight Bearing Restrictions No      Balance Screen   Has the patient fallen in the past 6 months No    Has the patient had a decrease in activity level because of a fear of falling?  Yes    Is the patient reluctant to leave their home because of a fear of falling?  No      Home Environment   Living Environment Private residence      Prior Function   Level of Independence Independent      Posture/Postural Control   Posture/Postural Control Postural limitations    Postural Limitations Rounded Shoulders;Forward head      Deep Tendon Reflexes   DTR Assessment Site Biceps;Brachioradialis;Triceps    Biceps DTR 2+    Brachioradialis DTR 2+    Triceps DTR 2+      ROM / Strength   AROM / PROM / Strength AROM;Strength      AROM   Overall AROM Comments Full bilateral UE range of motion.  Thoracic motion is WNL.      Strength   Overall Strength Comments Normal UE strength.       Palpation   Palpation comment Patient quite tender to palpation with overpressure at T6 to T8.                        Objective measurements completed on examination: See above findings.       [facility] Adult PT Treatment/Exercise - 11/02/21 0001       Modalities   Modalities Electrical Stimulation;Moist Heat      Moist Heat Therapy   Number Minutes Moist Heat 15 Minutes    Moist Heat Location --   Thoracic.     Programme researcher, broadcasting/film/video Location Left mid-Thoracic.    Electrical Stimulation Action IFC at 80-150 Hz.    Electrical Stimulation Parameters 40% scan                                  Plan - 11/02/21 1225     Clinical Impression Statement The patient presents to OPPT wiht c/o left-sided mid-back pain radiating to her anterior chest wall.  She is tender to palpation with overpressure at T6 to T8.  UE DTR's are intact.  Her ability to perform ADL's is significantly impacted due to high pain-levels.  UE strength is normal.  Any cardiac involvement has been ruled out.  Patient will benefit from skilled physical therapy intervention to address pain.    Personal Factors and Comorbidities Other    Examination-Activity Limitations Other    Examination-Participation Restrictions Cleaning;Other    Stability/Clinical Decision Making Stable/Uncomplicated    Clinical Decision Making Low    Rehab Potential Excellent    PT Frequency 2x / week    PT Treatment/Interventions ADLs/Self Care Home Management;Cryotherapy;Electrical Stimulation;Ultrasound;Moist Heat;Therapeutic activities;Therapeutic exercise;Manual techniques;Patient/family education;Joint Manipulations;Spinal Manipulations    PT Next Visit Plan Gentle mobs to mid-thoracic and left costovertebral joints on left.  Postural exercises.  Combo e'stim/US and STW/M.    Consulted and Agree with Plan of Care Patient             Patient will benefit from skilled  therapeutic intervention in order to improve the following deficits and impairments:  Pain, Decreased activity tolerance, Postural dysfunction  Visit Diagnosis: Pain in thoracic  spine - Plan: PT plan of care cert/re-cert     Problem List Patient Active Problem List   Diagnosis Date Noted   Bronchitis 04/03/2021   History of cerebral venous sinus thrombosis 11/17/2020   Pure hypercholesterolemia 11/17/2020   Family history of breast cancer 05/26/2020   GAD (generalized anxiety disorder) 03/28/2020   Depression, major, single episode, moderate (HCC) 03/28/2020   Bilateral plantar fasciitis 09/03/2019   OAB (overactive bladder) 09/03/2019   Cerebral venous sinus thrombosis 01/24/2019   Dislocation of jaw 05/15/2018   TMJ syndrome 05/15/2018   Asplenia    Anxiety and depression     Maggi Hershkowitz, [place], PT 11/02/2021, 12:30 PM  [facility] [address] [place], [place], [address] Phone: [phone]   Fax:  [phone]  Name: ROSIA SYME MRN: [id number] Date of Birth: 1966/08/28

## 2021-11-03 ENCOUNTER — Encounter: Payer: Self-pay | Admitting: Family Medicine

## 2021-11-03 ENCOUNTER — Ambulatory Visit: Payer: BC Managed Care – PPO | Admitting: Family Medicine

## 2021-11-03 VITALS — BP 112/68 | HR 95 | Temp 98.3°F | Ht 62.0 in | Wt 138.4 lb

## 2021-11-03 DIAGNOSIS — R0789 Other chest pain: Secondary | ICD-10-CM

## 2021-11-03 MED ORDER — GABAPENTIN 300 MG PO CAPS: 300.0000 mg | ORAL_CAPSULE | Freq: Three times a day (TID) | ORAL | 0 refills | Status: DC

## 2021-11-03 NOTE — Progress Notes (Signed)
Chief Complaint  Patient presents with   Follow-up    Subjective: Patient is a 55 y.o. female here for f/u side pain. Here w spouse.  Around 2 weeks of L side pain. Has been using Tylenol w little relief. Also sent to PT yesterday, had some improvement. No bruising, swelling, redness. No neuro s/s's. Needs forms filled out for work.   Past Medical History:  Diagnosis Date   Anxiety    Asplenia    Depression    Diverticulitis    Family history of anesthesia complication    mother difficult to wake   GERD (gastroesophageal reflux disease)    History of blood clots    Hyperlipidemia     Objective: BP 112/68   Pulse 95   Temp 98.3 F (36.8 C) (Oral)   Ht 5\' 2"  (1.575 m)   Wt 138 lb 6 oz (62.8 kg)   SpO2 97%   BMI 25.31 kg/m  General: Awake, appears stated age MSK: +TTP over L thor spine following course of rib; no crepitus, deformity, edema noted; +pain w R side bending, R rotation, L shoulder forward flexion and abduction Neuro: DTR's equal and symmetric in UE's b/l, no clonus, no cerebellar signs Lungs: No accessory muscle use Psych: Age appropriate judgment and insight, normal affect and mood  Assessment and Plan: Left-sided chest wall pain - Plan: gabapentin (NEURONTIN) 300 MG capsule  Cont Tylenol. Add gabapentin 300 mg TID, warned about drowsiness. Heat, ice, topical Voltaren. Cont w PT. Form filled out. F/u in 1 week to assess fitness to return to work.  The patient voiced understanding and agreement to the plan.  [place], DO 11/03/21  10:12 AM

## 2021-11-03 NOTE — Patient Instructions (Signed)
Ice/cold pack over area for 10-15 min twice daily.  Heat (pad or rice pillow in microwave) over affected area, 10-15 minutes twice daily.   OK to take Tylenol 1000 mg (2 extra strength tabs) or 975 mg (3 regular strength tabs) every 6 hours as needed.  Let us know if you need anything.  

## 2021-11-06 ENCOUNTER — Ambulatory Visit: Payer: BC Managed Care – PPO | Admitting: *Deleted

## 2021-11-06 ENCOUNTER — Other Ambulatory Visit: Payer: Self-pay

## 2021-11-06 DIAGNOSIS — M546 Pain in thoracic spine: Secondary | ICD-10-CM

## 2021-11-06 DIAGNOSIS — R0789 Other chest pain: Secondary | ICD-10-CM | POA: Diagnosis not present

## 2021-11-06 NOTE — Therapy (Signed)
[facility] [address] [place], [place], [address] Phone: [phone]   Fax:  [phone]  Physical Therapy Treatment  Patient Details  Name: CHANDA LAPERLE MRN: [id number] Date of Birth: 09/23/1966 Referring Provider (PT): Arva Chafe   Encounter Date: 11/06/2021   PT End of Session - 11/06/21 0906     Visit Number 2    Number of Visits 12    Date for PT Re-Evaluation 12/14/21    Authorization Type FOTO.    PT Start Time 0815    PT Stop Time 0906    PT Time Calculation (min) 51 min             Past Medical History:  Diagnosis Date   Anxiety    Asplenia    Depression    Diverticulitis    Family history of anesthesia complication    mother difficult to wake   GERD (gastroesophageal reflux disease)    History of blood clots    Hyperlipidemia     Past Surgical History:  Procedure Laterality Date   BREAST BIOPSY Right    needle core biopsy, benign   BREAST EXCISIONAL BIOPSY Left    2 times left breast, benign both times   BREAST SURGERY  breat biopsy x2 last 1995   DILATION AND CURETTAGE OF UTERUS  x2 1984 and 1991   excision left ganglion cyst Left    FOREIGN BODY REMOVAL Right 01/29/2013   Procedure: EXCISION FOREIGN BODY RIGHT SMALL FINGER;  Surgeon: Nicki Reaper, MD;  Location: [facility];  Service: Orthopedics;  Laterality: Right;   removal spleen  2000    There were no vitals filed for this visit.   Subjective Assessment - 11/06/21 0823     Subjective COVID-19 screen performed prior to patient entering clinic. Pain is constant 7/10 mid-back    Pertinent History Op (patient reported), h/o knee pain.    How long can you sit comfortably? Varies.    How long can you stand comfortably? Varies.    Patient Stated Goals Get out of pain    Currently in Pain? Yes    Pain Score 7     Pain Location Back    Pain Orientation Left    Pain Descriptors / Indicators Aching;[facility] Adult PT Treatment/Exercise - 11/06/21 0001       Modalities   Modalities Electrical Stimulation;Moist Heat;Ultrasound      Moist Heat Therapy   Number Minutes Moist Heat 15 Minutes    Moist Heat Location --   Thoracic.     Programme researcher, broadcasting/film/video Location Left mid-Thoracic.    Electrical Stimulation Action IFC at 80-150hz  x 15 mins    Electrical Stimulation Parameters Prone x15 mins    Electrical Stimulation Goals Pain      Ultrasound   Ultrasound Location midback LT side    Ultrasound Parameters prone 1.5 w/cm2 x 10 mins LT side    Ultrasound Goals Pain      Manual Therapy   Manual Therapy Soft tissue mobilization    Soft tissue mobilization STW to LT side thoracic paras while in prone  Plan - 11/06/21 0906     Clinical Impression Statement Pt arrived today 7/10 LT side midback pain. [place] combo was performed , f/b STW to this area all in prone over 1 pillow. HMP and estim end of session as well in prone. Pt did great and reported 1/10 end of RX session.    Personal Factors and Comorbidities Other    Examination-Activity Limitations Other    Stability/Clinical Decision Making Stable/Uncomplicated    Rehab Potential Excellent    PT Frequency 2x / week    PT Treatment/Interventions ADLs/Self Care Home Management;Cryotherapy;Electrical Stimulation;Ultrasound;Moist Heat;Therapeutic activities;Therapeutic exercise;Manual techniques;Patient/family education;Joint Manipulations;Spinal Manipulations    PT Next Visit Plan Gentle mobs to mid-thoracic and left costovertebral joints on left.  Postural exercises.  Combo e'stim/US and STW/M.             Patient will benefit from skilled therapeutic intervention in order to improve the following deficits and impairments:  Pain, Decreased activity tolerance, Postural dysfunction  Visit Diagnosis: Pain in thoracic  spine     Problem List Patient Active Problem List   Diagnosis Date Noted   Bronchitis 04/03/2021   History of cerebral venous sinus thrombosis 11/17/2020   Pure hypercholesterolemia 11/17/2020   Family history of breast cancer 05/26/2020   GAD (generalized anxiety disorder) 03/28/2020   Depression, major, single episode, moderate (HCC) 03/28/2020   Bilateral plantar fasciitis 09/03/2019   OAB (overactive bladder) 09/03/2019   Cerebral venous sinus thrombosis 01/24/2019   Dislocation of jaw 05/15/2018   TMJ syndrome 05/15/2018   Asplenia    Anxiety and depression     Brytney Somes,CHRIS, PTA 11/06/2021, 12:32 PM  [facility] [address] [place], [place], [address] Phone: [phone]   Fax:  [phone]  Name: KRYSTIANA FORNES MRN: [id number] Date of Birth: [birth year]

## 2021-11-09 ENCOUNTER — Ambulatory Visit: Payer: BC Managed Care – PPO

## 2021-11-09 ENCOUNTER — Other Ambulatory Visit: Payer: Self-pay

## 2021-11-11 ENCOUNTER — Encounter: Payer: Self-pay | Admitting: Gastroenterology

## 2021-11-11 ENCOUNTER — Other Ambulatory Visit: Payer: Self-pay

## 2021-11-11 ENCOUNTER — Encounter: Payer: Self-pay | Admitting: Family Medicine

## 2021-11-11 ENCOUNTER — Ambulatory Visit (AMBULATORY_SURGERY_CENTER): Payer: BC Managed Care – PPO | Admitting: Gastroenterology

## 2021-11-11 VITALS — BP 114/67 | HR 74 | Temp 97.5°F | Resp 16 | Ht 62.0 in | Wt 141.0 lb

## 2021-11-11 DIAGNOSIS — K5732 Diverticulitis of large intestine without perforation or abscess without bleeding: Secondary | ICD-10-CM

## 2021-11-11 DIAGNOSIS — K640 First degree hemorrhoids: Secondary | ICD-10-CM | POA: Diagnosis not present

## 2021-11-11 DIAGNOSIS — K529 Noninfective gastroenteritis and colitis, unspecified: Secondary | ICD-10-CM

## 2021-11-11 DIAGNOSIS — Q6680 Congenital vertical talus deformity, unspecified foot: Secondary | ICD-10-CM | POA: Insufficient documentation

## 2021-11-11 MED ORDER — SODIUM CHLORIDE 0.9 % IV SOLN: 500.0000 mL | Freq: Once | INTRAVENOUS | Status: DC

## 2021-11-11 NOTE — Progress Notes (Signed)
Called to room to assist during endoscopic procedure.  Patient ID and intended procedure confirmed with present staff. Received instructions for my participation in the procedure from the performing physician.  

## 2021-11-11 NOTE — Progress Notes (Signed)
Report given to PACU, vss 

## 2021-11-11 NOTE — Patient Instructions (Addendum)
Handouts provided on diverticulosis, hemorrhoids and high fiber diet.    Resume Xarelto at prior dose on 11/13/21.  Repeat colonoscopy in 10 years for screening purposes.   YOU HAD AN ENDOSCOPIC PROCEDURE TODAY AT THE [facility]:   Refer to the procedure report that was given to you for any specific questions about what was found during the examination.  If the procedure report does not answer your questions, please call your gastroenterologist to clarify.  If you requested that your care partner not be given the details of your procedure findings, then the procedure report has been included in a sealed envelope for you to review at your convenience later.  YOU SHOULD EXPECT: Some feelings of bloating in the abdomen. Passage of more gas than usual.  Walking can help get rid of the air that was put into your GI tract during the procedure and reduce the bloating. If you had a lower endoscopy (such as a colonoscopy or flexible sigmoidoscopy) you may notice spotting of blood in your stool or on the toilet paper. If you underwent a bowel prep for your procedure, you may not have a normal bowel movement for a few days.  Please Note:  You might notice some irritation and congestion in your nose or some drainage.  This is from the oxygen used during your procedure.  There is no need for concern and it should clear up in a day or so.  SYMPTOMS TO REPORT IMMEDIATELY:  Following lower endoscopy (colonoscopy or flexible sigmoidoscopy):  Excessive amounts of blood in the stool  Significant tenderness or worsening of abdominal pains  Swelling of the abdomen that is new, acute  Fever of 100F or higher  For urgent or emergent issues, a gastroenterologist can be reached at any hour by calling [phone]. Do not use MyChart messaging for urgent concerns.    DIET:  We do recommend a small meal at first, but then you may proceed to your regular diet.  Drink plenty of fluids but you should  avoid alcoholic beverages for 24 hours.  ACTIVITY:  You should plan to take it easy for the rest of today and you should NOT DRIVE or use heavy machinery until tomorrow (because of the sedation medicines used during the test).    FOLLOW UP: Our staff will call the number listed on your records 48-72 hours following your procedure to check on you and address any questions or concerns that you may have regarding the information given to you following your procedure. If we do not reach you, we will leave a message.  We will attempt to reach you two times.  During this call, we will ask if you have developed any symptoms of COVID 19. If you develop any symptoms (ie: fever, flu-like symptoms, shortness of breath, cough etc.) before then, please call [phone].  If you test positive for Covid 19 in the 2 weeks post procedure, please call and report this information to [place].    If any biopsies were taken you will be contacted by phone or by letter within the next 1-3 weeks.  Please call us at [phone] if you have not heard about the biopsies in 3 weeks.    SIGNATURES/CONFIDENTIALITY: You and/or your care partner have signed paperwork which will be entered into your electronic medical record.  These signatures attest to the fact that that the information above on your After Visit Summary has been reviewed and is understood.  Full responsibility of the confidentiality  of this discharge information lies with you and/or your care-partner.

## 2021-11-11 NOTE — Progress Notes (Signed)
Chief Complaint: LLQ pain  Referring Provider:  Shelda Pal*      ASSESSMENT AND PLAN;   #1.  H/O Acute sigmoid diverticulitis  06/2021 (resolved) for colon today  Plan: -colon today  HPI:    Cathy Simmons is a 55 y.o. female  Accompanied by her husband With H/O DVT on Xarelto, GERD, anxiety/depression, HLD  C/O LLQ pain since Sunday with chills.  No fever.  Mod.  She had 2 previous episodes last month which resolved on its own.  Seen in ED yesterday.  Underwent noncontrast CT Abdo/pelvis which showed diverticulitis.  CT was somewhat limited due to lack of IV contrast.  No definite abscess seen.  Her white cell count was elevated as well.  She was given Augmentin 875 twice daily x 7 days, Zofran for nausea, morphine sulfate 15 mg half a tablet every 6 hours as needed for abdominal pain.  Sent to GI clinic for emergency workin  Still has abdominal pain which is only slightly better.  She has only taken 1 dose of Augmentin.  She would normally have 3 BMs per day.  Last week she has been more constipated.  She denies use of nonsteroidals.  Nausea is better.  She denies having any upper abdominal pain, heartburn (on nexium), regurgitation.  History of colonoscopy for 15 to 20 years ago.  She does not recall where.  Past GI work-up:  CT AP with contrast 07/07/2021 -Stable nonobstructing left upper pole renal stone. -Diverticulosis without diverticulitis. -2.5 cm simple appearing cyst in the right adnexa. No follow-up imaging recommended.  CT renal stone study 09/08/2021 -Sigmoid diverticulosis with inflammatory stranding around the proximal sigmoid colon compatible with active diverticulitis. -Left upper pole nephrolithiasis. No ureteral stones or hydronephrosis. -Stable 2.4 cm right ovarian cyst. No follow-up recommended.    No acute abnormality noted. 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   Depression    Diverticulitis    Family history of anesthesia complication    mother difficult to wake   GERD (gastroesophageal reflux disease)    History of blood clots    Hyperlipidemia    Osteoporosis     Past Surgical History:  Procedure Laterality Date   BREAST BIOPSY Right    needle core biopsy, benign   BREAST EXCISIONAL BIOPSY Left    2 times left breast, benign both times   BREAST SURGERY  breat biopsy x2 last [facility]  x2 1984 and 1991   excision left ganglion cyst Left    FOREIGN BODY REMOVAL Right 01/29/2013   Procedure: EXCISION FOREIGN BODY RIGHT SMALL FINGER;  Surgeon: Wynonia Sours, MD;  Location: [facility];  Service: Orthopedics;  Laterality: Right;   removal spleen  2000    Family History  Problem Relation Age of Onset   Stroke Mother    Cancer Mother        breast cancer   Breast cancer Mother    Diabetes Father    Parkinson's disease Father    Colon cancer Neg Hx    Rectal cancer Neg Hx    Esophageal cancer Neg Hx    Stomach cancer Neg Hx     Social History   Tobacco Use   Smoking status: Former    Packs/day: 0.50    Types: Cigarettes  Quit date: 01/30/1999    Years since quitting: 22.7   Smokeless tobacco: Never  Vaping Use   Vaping Use: Never used  Substance Use Topics   Alcohol use: No   Drug use: No    Current Outpatient Medications  Medication Sig Dispense Refill   cetirizine (ZYRTEC) 10 MG tablet Take 10 mg by mouth daily.     clonazePAM (KLONOPIN) 0.5 MG tablet Take 1 tablet (0.5 mg total) by mouth 2 (two) times daily as needed. for anxiety 30 tablet 3   esomeprazole (NEXIUM) 20 MG capsule Take 20 mg by mouth daily at 12 noon.     gabapentin (NEURONTIN) 300 MG capsule Take 1 capsule (300 mg total) by mouth 3 (three) times daily. 90 capsule 0   lidocaine (XYLOCAINE) 5 % ointment Apply 1 application topically 3 (three) times daily as needed for moderate pain.  35.44 g 0   rosuvastatin (CRESTOR) 20 MG tablet Take 1 tablet (20 mg total) by mouth daily. 90 tablet 3   vitamin B-12 (CYANOCOBALAMIN) 1000 MCG tablet Take 500-1,000 mcg by mouth daily.      albuterol (VENTOLIN HFA) 108 (90 Base) MCG/ACT inhaler Inhale 2 puffs into the lungs every 6 (six) hours as needed for wheezing or shortness of breath. 6.7 g 0   Cholecalciferol (VITAMIN D-3) 1000 units CAPS Take 1,000 Units by mouth daily.      dicyclomine (BENTYL) 10 MG capsule Take 1 tab every 6 hours as needed for abdominal cramping. 60 capsule 0   ondansetron (ZOFRAN ODT) 4 MG disintegrating tablet 4mg  ODT q4 hours prn nausea/vomit 20 tablet 0   rivaroxaban (XARELTO) 20 MG TABS tablet Take 1 tablet (20 mg total) by mouth daily with supper. 90 tablet 3   tiZANidine (ZANAFLEX) 4 MG tablet Take 1 tablet (4 mg total) by mouth every 6 (six) hours as needed for muscle spasms. 30 tablet 0   Current Facility-Administered Medications  Medication Dose Route Frequency Provider Last Rate Last Admin   0.9 %  sodium chloride infusion  500 mL Intravenous Once Jackquline Denmark, MD        Allergies  Allergen Reactions   Bactrim [Sulfamethoxazole-Trimethoprim] Other (See Comments)    Sulfa causes yeast infections!   Adhesive [Tape] Rash and Other (See Comments)    Band-Aids   Cymbalta [Duloxetine Hcl] Anxiety    Review of Systems:  Constitutional: Denies fever, chills, diaphoresis, appetite change and fatigue.  HEENT: Denies photophobia, eye pain, redness, hearing loss, ear pain, congestion, sore throat, rhinorrhea, sneezing, mouth sores, neck pain, neck stiffness and tinnitus.   Respiratory: Denies SOB, DOE, cough, chest tightness,  and wheezing.   Cardiovascular: Denies chest pain, palpitations and leg swelling.  Genitourinary: Denies dysuria, urgency, frequency, hematuria, flank pain and difficulty urinating.  Musculoskeletal: Denies myalgias, back pain, joint swelling, arthralgias and gait problem.  Skin: No  rash.  Neurological: Denies dizziness, seizures, syncope, weakness, light-headedness, numbness and headaches.  Hematological: Denies adenopathy. Has Easy bruising, personal or family bleeding history  Psychiatric/Behavioral: has anxiety or depression     Physical Exam:    BP 118/82    Pulse 88    Temp (!) 97.5 F (36.4 C) (Temporal)    Ht 5\' 2"  (1.575 m)    Wt 141 lb (64 kg)    SpO2 99%    BMI 25.79 kg/m  Wt Readings from Last 3 Encounters:  11/11/21 141 lb (64 kg)  11/03/21 138 lb 6 oz (62.8 kg)  10/27/21 138 lb (62.6  kg)   Constitutional:  Well-developed, in no acute distress. Psychiatric: Normal mood and affect. Behavior is normal. HEENT: Pupils normal.  Conjunctivae are normal. No scleral icterus. Cardiovascular: Normal rate, regular rhythm. No edema Pulmonary/chest: Effort normal and breath sounds normal. No wheezing, rales or rhonchi. Abdominal: Soft, nondistended. Mild LLQ tenderness without rebound. Bowel sounds active throughout. There are no masses palpable. No hepatomegaly. Rectal: Deferred Neurological: Alert and oriented to person place and time. Skin: Skin is warm and dry. No rashes noted.  Data Reviewed: I have personally reviewed following labs and imaging studies  CBC: CBC Latest Ref Rng & Units 10/25/2021 09/08/2021 07/19/2021  WBC 4.0 - 10.5 K/uL 8.7 14.0(H) 11.4(H)  Hemoglobin 12.0 - 15.0 g/dL 14.0 13.8 14.1  Hematocrit 36.0 - 46.0 % 42.5 41.4 42.1  Platelets 150 - 400 K/uL 289 306 273    CMP: CMP Latest Ref Rng & Units 10/25/2021 09/08/2021 07/19/2021  Glucose 70 - 99 mg/dL 100(H) 106(H) 102(H)  BUN 6 - 20 mg/dL 15 6 12   Creatinine 0.44 - 1.00 mg/dL 0.55 0.56 0.54  Sodium 135 - 145 mmol/L 140 140 139  Potassium 3.5 - 5.1 mmol/L 4.0 3.6 3.6  Chloride 98 - 111 mmol/L 106 105 102  CO2 22 - 32 mmol/L 28 27 27   Calcium 8.9 - 10.3 mg/dL 9.7 9.4 9.6  Total Protein 6.5 - 8.1 g/dL 8.0 7.5 -  Total Bilirubin 0.3 - 1.2 mg/dL 0.5 0.4 -  Alkaline Phos 38 - 126  U/L 90 99 -  AST 15 - 41 U/L 22 22 -  ALT 0 - 44 U/L 20 18 -    GFR: Estimated Creatinine Clearance: 69 mL/min (by C-G formula based on SCr of 0.55 mg/dL). Liver Function Tests: No results for input(s): AST, ALT, ALKPHOS, BILITOT, PROT, ALBUMIN in the last 168 hours.  No results for input(s): LIPASE, AMYLASE in the last 168 hours.     Radiology Studies: CT Angio Chest PE W and/or Wo Contrast  Result Date: 10/25/2021 CLINICAL DATA:  PE suspected, high prob; Abdominal pain, acute, nonlocalized LUQ pain EXAM: CT ANGIOGRAPHY CHEST CT ABDOMEN AND PELVIS WITH CONTRAST TECHNIQUE: Multidetector CT imaging of the chest was performed using the standard protocol during bolus administration of intravenous contrast. Multiplanar CT image reconstructions and MIPs were obtained to evaluate the vascular anatomy. Multidetector CT imaging of the abdomen and pelvis was performed using the standard protocol during bolus administration of intravenous contrast. CONTRAST:  153mL OMNIPAQUE IOHEXOL 350 MG/ML SOLN COMPARISON:  None. FINDINGS: CTA CHEST FINDINGS Cardiovascular: Satisfactory opacification of the pulmonary arteries to the segmental level. No evidence of pulmonary embolism. Normal heart size. No significant pericardial effusion. The thoracic aorta is normal in caliber. No atherosclerotic plaque of the thoracic aorta. No coronary artery calcifications. Mediastinum/Nodes: No enlarged mediastinal, hilar, or axillary lymph nodes. Thyroid gland, trachea, and esophagus demonstrate no significant findings. Lungs/Pleura: Bilateral lower lobe atelectasis. No focal consolidation. No pulmonary nodule. No pulmonary mass. No pleural effusion. No pneumothorax. Musculoskeletal: No chest wall abnormality. No suspicious lytic or blastic osseous lesions. No acute displaced fracture. Review of the MIP images confirms the above findings. CT ABDOMEN and PELVIS FINDINGS Hepatobiliary: No focal liver abnormality. No gallstones,  gallbladder wall thickening, or pericholecystic fluid. No biliary dilatation. Pancreas: No focal lesion. Normal pancreatic contour. No surrounding inflammatory changes. No main pancreatic ductal dilatation. Spleen: Status post splenectomy. Adrenals/Urinary Tract: No adrenal nodule bilaterally. Bilateral kidneys enhance symmetrically. 5 mm calcified stone within the left kidney. Several other  punctate stones associated with this. No hydronephrosis. No hydroureter. The urinary bladder is unremarkable. On delayed imaging, there is no urothelial wall thickening and there are no filling defects in the opacified portions of the bilateral collecting systems or ureters. Stomach/Bowel: Stomach is within normal limits. No evidence of bowel wall thickening or dilatation. Scattered colonic diverticulosis with no acute diverticulitis. Appendix appears normal. Vascular/Lymphatic: No abdominal aorta or iliac aneurysm. Mild atherosclerotic plaque of the aorta and its branches. No abdominal, pelvic, or inguinal lymphadenopathy. Reproductive: There is a 2.5 cm right ovarian simple cystic lesion. Uterus and bilateral adnexa are unremarkable. Other: No intraperitoneal free fluid. No intraperitoneal free gas. No organized fluid collection. Musculoskeletal: No chest wall abnormality. No suspicious lytic or blastic osseous lesions. No acute displaced fracture. L5-S1 endplate sclerosis. Review of the MIP images confirms the above findings. IMPRESSION: 1. No pulmonary embolus. 2. No acute intrathoracic abnormality. 3. Nonobstructive 5 mm left nephrolithiasis. 4. Scattered colonic diverticulosis with no acute diverticulitis. 5. A 2.5 cm right ovarian cystic lesion. No follow-up imaging recommended. Note: This recommendation does not apply to premenarchal patients and to those with increased risk (genetic, family history, elevated tumor markers or other high-risk factors) of ovarian cancer. Reference: JACR 2020 Feb; 17(2):248-254  Electronically Signed   By: Iven Finn M.D.   On: 10/25/2021 20:41   CT ABDOMEN PELVIS W CONTRAST  Result Date: 10/25/2021 CLINICAL DATA:  PE suspected, high prob; Abdominal pain, acute, nonlocalized LUQ pain EXAM: CT ANGIOGRAPHY CHEST CT ABDOMEN AND PELVIS WITH CONTRAST TECHNIQUE: Multidetector CT imaging of the chest was performed using the standard protocol during bolus administration of intravenous contrast. Multiplanar CT image reconstructions and MIPs were obtained to evaluate the vascular anatomy. Multidetector CT imaging of the abdomen and pelvis was performed using the standard protocol during bolus administration of intravenous contrast. CONTRAST:  178mL OMNIPAQUE IOHEXOL 350 MG/ML SOLN COMPARISON:  None. FINDINGS: CTA CHEST FINDINGS Cardiovascular: Satisfactory opacification of the pulmonary arteries to the segmental level. No evidence of pulmonary embolism. Normal heart size. No significant pericardial effusion. The thoracic aorta is normal in caliber. No atherosclerotic plaque of the thoracic aorta. No coronary artery calcifications. Mediastinum/Nodes: No enlarged mediastinal, hilar, or axillary lymph nodes. Thyroid gland, trachea, and esophagus demonstrate no significant findings. Lungs/Pleura: Bilateral lower lobe atelectasis. No focal consolidation. No pulmonary nodule. No pulmonary mass. No pleural effusion. No pneumothorax. Musculoskeletal: No chest wall abnormality. No suspicious lytic or blastic osseous lesions. No acute displaced fracture. Review of the MIP images confirms the above findings. CT ABDOMEN and PELVIS FINDINGS Hepatobiliary: No focal liver abnormality. No gallstones, gallbladder wall thickening, or pericholecystic fluid. No biliary dilatation. Pancreas: No focal lesion. Normal pancreatic contour. No surrounding inflammatory changes. No main pancreatic ductal dilatation. Spleen: Status post splenectomy. Adrenals/Urinary Tract: No adrenal nodule bilaterally. Bilateral  kidneys enhance symmetrically. 5 mm calcified stone within the left kidney. Several other punctate stones associated with this. No hydronephrosis. No hydroureter. The urinary bladder is unremarkable. On delayed imaging, there is no urothelial wall thickening and there are no filling defects in the opacified portions of the bilateral collecting systems or ureters. Stomach/Bowel: Stomach is within normal limits. No evidence of bowel wall thickening or dilatation. Scattered colonic diverticulosis with no acute diverticulitis. Appendix appears normal. Vascular/Lymphatic: No abdominal aorta or iliac aneurysm. Mild atherosclerotic plaque of the aorta and its branches. No abdominal, pelvic, or inguinal lymphadenopathy. Reproductive: There is a 2.5 cm right ovarian simple cystic lesion. Uterus and bilateral adnexa are unremarkable. Other: No intraperitoneal  free fluid. No intraperitoneal free gas. No organized fluid collection. Musculoskeletal: No chest wall abnormality. No suspicious lytic or blastic osseous lesions. No acute displaced fracture. L5-S1 endplate sclerosis. Review of the MIP images confirms the above findings. IMPRESSION: 1. No pulmonary embolus. 2. No acute intrathoracic abnormality. 3. Nonobstructive 5 mm left nephrolithiasis. 4. Scattered colonic diverticulosis with no acute diverticulitis. 5. A 2.5 cm right ovarian cystic lesion. No follow-up imaging recommended. Note: This recommendation does not apply to premenarchal patients and to those with increased risk (genetic, family history, elevated tumor markers or other high-risk factors) of ovarian cancer. Reference: JACR 2020 Feb; 17(2):248-254 Electronically Signed   By: Tish Frederickson M.D.   On: 10/25/2021 20:41      Edman Circle, MD 11/11/2021, 8:18 AM  Cc: Sharlene Dory*

## 2021-11-11 NOTE — Op Note (Addendum)
[facility] Patient Name: Cathy Simmons Procedure Date: 11/11/2021 8:18 AM MRN: [id number] Endoscopist: Lynann Bologna , MD Age: 55 Referring MD:  Date of Birth: 05/29/1966 Gender: Female Account #: [id number] Procedure:                Colonoscopy Indications:              Screening for colorectal malignant neoplasm. H/O                            diverticulitis. Intermittent diarrhea. Medicines:                Monitored Anesthesia Care Procedure:                Pre-Anesthesia Assessment:                           - Prior to the procedure, a History and Physical                            was performed, and patient medications and                            allergies were reviewed. The patient's tolerance of                            previous anesthesia was also reviewed. The risks                            and benefits of the procedure and the sedation                            options and risks were discussed with the patient.                            All questions were answered, and informed consent                            was obtained. Prior Anticoagulants: The patient has                            taken Xarelto (rivaroxaban), last dose was 1 day                            prior to procedure. ASA Grade Assessment: II - A                            patient with mild systemic disease. After reviewing                            the risks and benefits, the patient was deemed in                            satisfactory condition to undergo the procedure.  After obtaining informed consent, the colonoscope                            was passed under direct vision. Throughout the                            procedure, the patient's blood pressure, pulse, and                            oxygen saturations were monitored continuously. The                            [id number]-HQ190L Colonoscope was introduced through the                            anus and  advanced to the 2 cm into the ileum. The                            colonoscopy was performed without difficulty. The                            patient tolerated the procedure well. The quality                            of the bowel preparation was good. The terminal                            ileum, ileocecal valve, appendiceal orifice, and                            rectum were photographed. Scope In: 8:25:47 AM Scope Out: 8:38:37 AM Scope Withdrawal Time: 0 hours 9 minutes 42 seconds  Total Procedure Duration: 0 hours 12 minutes 50 seconds  Findings:                 Multiple small-mouthed diverticula were found in                            the sigmoid colon, descending colon and and few in                            ascending colon.                           The colon (entire examined portion) appeared                            normal. Biopsies for histology were taken with a                            cold forceps from the entire colon for evaluation                            of microscopic colitis.  Non-bleeding internal hemorrhoids were found during                            retroflexion. The hemorrhoids were small and Grade                            I (internal hemorrhoids that do not prolapse).                           The terminal ileum appeared normal.                           The exam was otherwise without abnormality on                            direct and retroflexion views. Complications:            No immediate complications. Estimated Blood Loss:     Estimated blood loss: none. Impression:               - Diverticulosis in the sigmoid colon, in the                            descending colon and few in the ascending colon.                           - The entire examined colon is normal. Biopsied.                           - Non-bleeding internal hemorrhoids.                           - The examined portion of the ileum was normal.                            - The examination was otherwise normal on direct                            and retroflexion views. Recommendation:           - Patient has a contact number available for                            emergencies. The signs and symptoms of potential                            delayed complications were discussed with the                            patient. Return to normal activities tomorrow.                            Written discharge instructions were provided to the                            patient.                           -  High fiber diet.                           - Continue present medications.                           - Await pathology results.                           - Avoid nonsteroidals.                           - Repeat colonoscopy in 10 years for screening                            purposes. Earlier, if with any new problems or                            change in family history.                           - Resume Xarelto 12/16                           - The findings and recommendations were discussed                            with the patient's family. Jackquline Denmark, MD 11/11/2021 8:44:37 AM This report has been signed electronically.

## 2021-11-12 ENCOUNTER — Ambulatory Visit: Payer: BC Managed Care – PPO

## 2021-11-12 ENCOUNTER — Other Ambulatory Visit: Payer: Self-pay

## 2021-11-12 DIAGNOSIS — M546 Pain in thoracic spine: Secondary | ICD-10-CM | POA: Diagnosis not present

## 2021-11-12 DIAGNOSIS — R0789 Other chest pain: Secondary | ICD-10-CM | POA: Diagnosis not present

## 2021-11-12 NOTE — Therapy (Signed)
[facility] [address] [place], [place], [address] Phone: [phone]   Fax:  [phone]  Physical Therapy Treatment  Patient Details  Name: Cathy Simmons MRN: [id number] Date of Birth: [birth year] Referring Provider (PT): Arva Chafe   Encounter Date: 11/12/2021   PT End of Session - 11/12/21 0739     Visit Number 3    Number of Visits 12    Date for PT Re-Evaluation 12/14/21    Authorization Type FOTO.    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   Depression    Diverticulitis    Family history of anesthesia complication    mother difficult to wake   GERD (gastroesophageal reflux disease)    History of blood clots    Hyperlipidemia    Osteoporosis     Past Surgical History:  Procedure Laterality Date   BREAST BIOPSY Right    needle core biopsy, benign   BREAST EXCISIONAL BIOPSY Left    2 times left breast, benign both times   BREAST SURGERY  breat biopsy x2 last 1995   DILATION AND CURETTAGE OF UTERUS  x2 1984 and 1991   excision left ganglion cyst Left    FOREIGN BODY REMOVAL Right 01/29/2013   Procedure: EXCISION FOREIGN BODY RIGHT SMALL FINGER;  Surgeon: Nicki Reaper, MD;  Location:  SURGERY CENTER;  Service: Orthopedics;  Laterality: Right;   removal spleen  2000    There were no vitals filed for this visit.   Subjective Assessment - 11/12/21 0738     Subjective COVID-19 screen performed prior to patient entering clinic. Pt arrives for today's treatment session reporting 3/10 left thoracic back pain.  States she had a colonoscopy yesterday.    Pertinent History Op (patient reported), h/o knee pain.    How long can you sit comfortably? Varies.    How long can you stand comfortably? Varies.    Patient Stated  Goals Get out of pain    Currently in Pain? Yes    Pain Score 3     Pain Location Back    Pain Orientation Left                               [facility] Adult PT Treatment/Exercise - 11/12/21 0001       Exercises   Exercises Lumbar      Lumbar Exercises: Aerobic   Nustep Lvl 3 x 15 mins      Modalities   Modalities Electrical Stimulation;Moist Heat;Ultrasound      Moist Heat Therapy   Number Minutes Moist Heat 15 Minutes    Moist Heat Location Lumbar Spine      Electrical Stimulation   Electrical Stimulation Location Left mid-Thoracic.    Electrical Stimulation Action IFC at 80-150 Hz    Electrical Stimulation Parameters Prone x 15 mins    Electrical Stimulation Goals Pain      Manual Therapy   Manual Therapy Soft tissue mobilization    Soft tissue mobilization STW to LT side thoracic paras while in prone  Plan - 11/12/21 0739     Clinical Impression Statement Pt arrives for today's treatment session reporting 3/10 left side midback pain.  Pt able to tolerate introduction of Nustep as warmup without issue or complaint.  STM/W performed to this area in prone over one pillow.  Normal repsonses to estim and moist heat noted with pt positioned in prone.  Pt reported 0/10 midback pain at completion of today's treatment session.    Personal Factors and Comorbidities Other    Examination-Activity Limitations Other    Stability/Clinical Decision Making Stable/Uncomplicated    Rehab Potential Excellent    PT Frequency 2x / week    PT Treatment/Interventions ADLs/Self Care Home Management;Cryotherapy;Electrical Stimulation;Ultrasound;Moist Heat;Therapeutic activities;Therapeutic exercise;Manual techniques;Patient/family education;Joint Manipulations;Spinal Manipulations    PT Next Visit Plan Gentle mobs to mid-thoracic and left costovertebral joints on left.  Postural exercises.  Combo e'stim/US and STW/M.              Patient will benefit from skilled therapeutic intervention in order to improve the following deficits and impairments:  Pain, Decreased activity tolerance, Postural dysfunction  Visit Diagnosis: Pain in thoracic spine     Problem List Patient Active Problem List   Diagnosis Date Noted   CVT (congenital vertical talus) 11/11/2021   Bronchitis 04/03/2021   History of cerebral venous sinus thrombosis 11/17/2020   Pure hypercholesterolemia 11/17/2020   Family history of breast cancer 05/26/2020   GAD (generalized anxiety disorder) 03/28/2020   Depression, major, single episode, moderate (HCC) 03/28/2020   Bilateral plantar fasciitis 09/03/2019   OAB (overactive bladder) 09/03/2019   Cerebral venous sinus thrombosis 01/24/2019   Dislocation of jaw 05/15/2018   TMJ syndrome 05/15/2018   Asplenia    Anxiety and depression     Newman Pies, PTA 11/12/2021, 8:23 AM  [facility] [address] [place], [place], [address] Phone: [phone]   Fax:  [phone]  Name: Cathy Simmons MRN: [id number] Date of Birth: 1966-03-07

## 2021-11-13 ENCOUNTER — Encounter: Payer: Self-pay | Admitting: Family Medicine

## 2021-11-13 ENCOUNTER — Telehealth: Payer: Self-pay

## 2021-11-13 ENCOUNTER — Telehealth: Payer: Self-pay | Admitting: *Deleted

## 2021-11-13 ENCOUNTER — Ambulatory Visit: Payer: BC Managed Care – PPO | Admitting: Family Medicine

## 2021-11-13 VITALS — BP 120/78 | HR 79 | Temp 98.0°F | Ht 62.0 in | Wt 138.0 lb

## 2021-11-13 DIAGNOSIS — R0789 Other chest pain: Secondary | ICD-10-CM | POA: Diagnosis not present

## 2021-11-13 DIAGNOSIS — Z1231 Encounter for screening mammogram for malignant neoplasm of breast: Secondary | ICD-10-CM | POA: Diagnosis not present

## 2021-11-13 NOTE — Progress Notes (Signed)
Chief Complaint  Patient presents with   Follow-up    FMLA     Subjective: Patient is a 55 y.o. female here for f/u. Here w spouse.   Following up for L sided chest wall pain. Went to PT and started to improve. Requesting to return to work. No bruising, redness, swelling. Needs form filled out to return without restrictions.   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 No accessory muscle use Psych: Age appropriate judgment and insight, normal affect and mood  Assessment and Plan: Left-sided chest wall pain  Encounter for screening mammogram for malignant neoplasm of breast - Plan: MM DIGITAL SCREENING BILATERAL  Ice, heat, Tylenol. Cont massage/pressure over the muscle. Form filled out to return to work on [facility] 12/19.  F/u as originally scheduled.  The patient voiced understanding and agreement to the plan.  1/20 [place], DO 11/13/21  10:35 AM

## 2021-11-13 NOTE — Telephone Encounter (Signed)
°  Follow up Call-  Call back number 11/11/2021  Post procedure Call Back phone  # [phone]  Permission to leave phone message Yes  Some recent data might be hidden     Patient questions:  Do you have a fever, pain , or abdominal swelling? No. Pain Score  0 *  Have you tolerated food without any problems? Yes.    Have you been able to return to your normal activities? Yes.    Do you have any questions about your discharge instructions: Diet   No. Medications  No. Follow up visit  No.  Do you have questions or concerns about your Care? No.  Actions: * If pain score is 4 or above: No action needed, pain <4.  Have you developed a fever since your procedure? no  2.   Have you had an respiratory symptoms (SOB or cough) since your procedure? no  3.   Have you tested positive for COVID 19 since your procedure no  4.   Have you had any family members/close contacts diagnosed with the COVID 19 since your procedure?  no   If yes to any of these questions please route to Laverna Peace, RN and Karlton Lemon, RN

## 2021-11-13 NOTE — Telephone Encounter (Signed)
F/u call attempted, no answer, unable to leave msg.

## 2021-11-13 NOTE — Patient Instructions (Signed)
Heat (pad or rice pillow in microwave) over affected area, 10-15 minutes twice daily.   Ice/cold pack over area for 10-15 min twice daily.  OK to take Tylenol 1000 mg (2 extra strength tabs) or 975 mg (3 regular strength tabs) every 6 hours as needed.  Continue massage over the area.   Let us know if you need anything.

## 2021-11-15 ENCOUNTER — Encounter: Payer: Self-pay | Admitting: Gastroenterology

## 2021-11-24 ENCOUNTER — Ambulatory Visit: Payer: BC Managed Care – PPO | Admitting: Family Medicine

## 2021-12-07 ENCOUNTER — Encounter: Payer: Self-pay | Admitting: Family Medicine

## 2021-12-07 ENCOUNTER — Ambulatory Visit: Payer: BC Managed Care – PPO | Admitting: Family Medicine

## 2021-12-07 VITALS — BP 112/68 | HR 76 | Temp 98.1°F | Ht 62.0 in | Wt 137.2 lb

## 2021-12-07 DIAGNOSIS — F321 Major depressive disorder, single episode, moderate: Secondary | ICD-10-CM

## 2021-12-07 DIAGNOSIS — E7800 Pure hypercholesterolemia, unspecified: Secondary | ICD-10-CM

## 2021-12-07 DIAGNOSIS — Z86718 Personal history of other venous thrombosis and embolism: Secondary | ICD-10-CM | POA: Diagnosis not present

## 2021-12-07 DIAGNOSIS — F411 Generalized anxiety disorder: Secondary | ICD-10-CM

## 2021-12-07 LAB — LIPID PANEL
Cholesterol: 127 mg/dL (ref 0–200)
HDL: 56 mg/dL (ref >39.00)
LDL Cholesterol: 55 mg/dL (ref 0–99)
NonHDL: 70.87
Total CHOL/HDL Ratio: 2
Triglycerides: 78 mg/dL (ref 0.0–149.0)
VLDL: 15.6 mg/dL (ref 0.0–40.0)

## 2021-12-07 LAB — COMPREHENSIVE METABOLIC PANEL
ALT: 17 U/L (ref 0–35)
AST: 21 U/L (ref 0–37)
Albumin: 4.1 g/dL (ref 3.5–5.2)
Alkaline Phosphatase: 84 U/L (ref 39–117)
BUN: 10 mg/dL (ref 6–23)
CO2: 31 mEq/L (ref 19–32)
Calcium: 9.3 mg/dL (ref 8.4–10.5)
Chloride: 105 mEq/L (ref 96–112)
Creatinine, Ser: 0.58 mg/dL (ref 0.40–1.20)
GFR: 100.97 mL/min (ref >60.00)
Glucose, Bld: 90 mg/dL (ref 70–99)
Potassium: 3.6 mEq/L (ref 3.5–5.1)
Sodium: 142 mEq/L (ref 135–145)
Total Bilirubin: 0.4 mg/dL (ref 0.2–1.2)
Total Protein: 6.8 g/dL (ref 6.0–8.3)

## 2021-12-07 NOTE — Progress Notes (Signed)
Chief Complaint  Patient presents with   Follow-up    6 month    Subjective Cathy Simmons presents for f/u anxiety/depression.  Pt is currently being treated with Klonopin prn.  Reports doing poorly since treatment. We tried to set her up w psych but she has not been scheduled.  No thoughts of harming self or others. No self-medication with alcohol, prescription drugs or illicit drugs. Pt is not following with a counselor/psychologist.  Hyperlipidemia Patient presents for dyslipidemia follow up. Currently being treated with Crestor 20 mg/d and compliance with treatment thus far has been good. She denies myalgias. She is sometimes adhering to a healthy diet. Exercise: active at work The patient is not known to have coexisting coronary artery disease.  She has a hx of blood clots on Xarelto 20 mg/d. She is compliant, no AE's. No active bleeding. She is avoiding NSAIDs.  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   Adhesive [tape] Rash, Other (See Comments)   Band-Aids   Cymbalta [duloxetine Hcl] Anxiety        Medication List        Accurate as of December 07, 2021  7:40 AM. If you have any questions, ask your nurse or doctor.          STOP taking these medications    dicyclomine 10 MG capsule Commonly known as: BENTYL Stopped by: Sharlene Dory, DO   gabapentin 300 MG capsule Commonly known as: NEURONTIN Stopped by: Sharlene Dory, DO   lidocaine 5 % ointment Commonly known as: XYLOCAINE Stopped by:  Sharlene Dory, DO   ondansetron 4 MG disintegrating tablet Commonly known as: Zofran ODT Stopped by: Sharlene Dory, DO   tiZANidine 4 MG tablet Commonly known as: Zanaflex Stopped by: Sharlene Dory, DO       TAKE these medications    albuterol 108 (90 Base) MCG/ACT inhaler Commonly known as: VENTOLIN HFA Inhale 2 puffs into the lungs every 6 (six) hours as needed for wheezing or shortness of breath.   cetirizine 10 MG tablet Commonly known as: ZYRTEC Take 10 mg by mouth daily.   clonazePAM 0.5 MG tablet Commonly known as: KLONOPIN Take 1 tablet (0.5 mg total) by mouth 2 (two) times daily as needed. for anxiety   esomeprazole 20 MG capsule Commonly known as: NEXIUM Take 20 mg by mouth daily at 12 noon.   rivaroxaban 20 MG Tabs tablet Commonly known as: XARELTO Take 1 tablet (20 mg total) by mouth daily with supper.   rosuvastatin 20 MG tablet Commonly known as: Crestor Take 1 tablet (20 mg total) by mouth daily.   vitamin B-12 1000 MCG tablet Commonly known as: CYANOCOBALAMIN Take 500-1,000 mcg by mouth daily.   Vitamin D-3 25 MCG (1000 UT) Caps Take 1,000 Units by mouth daily.        Exam BP 112/68    Pulse 76    Temp 98.1 F (36.7 C) (  Oral)    Ht 5\' 2"  (1.575 m)    Wt 137 lb 4 oz (62.3 kg)    SpO2 97%    BMI 25.10 kg/m  General:  well developed, well nourished, in no apparent distress Heart: RRR Lungs: CTAB, no respiratory distress Psych: well oriented with normal range of affect and age-appropriate judgement/insight, alert and oriented x4.  Assessment and Plan  Depression, major, single episode, moderate (HCC), Chronic  GAD (generalized anxiety disorder)  Pure hypercholesterolemia - Plan: Comprehensive metabolic panel, Lipid panel  History of cerebral venous sinus thrombosis  History of blood clots  1/2. Chronic, not currently controlled. Cont Klonopin for now. Needs to set up with psych. I provided her with local  resources in her AVS as our in-house team is not accepting referrals at this time. Will hold off on counseling set up as she may be able to set up with hte same group she sees.  3. Chronic, stable. Cont Crestor 20 mg/d. Counseled on diet/exercise. 4/5. Cont Xarelto 20 mg/d.  F/u in 6 mo for CPE or prn. The patient voiced understanding and agreement to the plan.  [place], DO 12/07/21 7:40 AM

## 2021-12-07 NOTE — Patient Instructions (Addendum)
Give [place] 2-3 business days to get the results of your labs back.   Keep the diet clean and stay active.  Consider adding Pepcid (famotidine) 20 mg twice daily in addition to Nexium for reflux flares.   [facility] [address] [place], [place] [address] [phone]  [facility] [address] [place], [place] [address] [phone]  [facility] [address] [place], [place] [address] [phone]  [facility] [address], [place], [place], #[phone] [address], [place], [place], #[phone]  [facility] [address] [place], [place] 7[address] [phone]  [facility] [address] [place], [place] [phone]  [facility] [address] [place], [place] [phone]  [facility] [address] [place], [place]   [phone].   Please go online to complete a form to submit first.  [facility]  [address], [address] [place], [place] [phone].     [facility] -  local practices located at: [address] [place], [place]. [phone]. [address] [place], [place], [place].  [phone]. [address], [place], [place].  [phone].  Contact one of these offices sooner than later as it can take 2-3 months to get a new patient appointment.

## 2021-12-14 ENCOUNTER — Ambulatory Visit (HOSPITAL_BASED_OUTPATIENT_CLINIC_OR_DEPARTMENT_OTHER)
Admission: RE | Admit: 2021-12-14 | Discharge: 2021-12-14 | Disposition: A | Discharge status: Home / Self Care | Payer: BC Managed Care – PPO | Source: Ambulatory Visit | Attending: Family Medicine | Admitting: Family Medicine

## 2021-12-14 ENCOUNTER — Encounter (HOSPITAL_BASED_OUTPATIENT_CLINIC_OR_DEPARTMENT_OTHER): Payer: Self-pay

## 2021-12-14 ENCOUNTER — Other Ambulatory Visit: Payer: Self-pay

## 2021-12-14 DIAGNOSIS — Z1231 Encounter for screening mammogram for malignant neoplasm of breast: Secondary | ICD-10-CM | POA: Insufficient documentation

## 2022-01-17 IMAGING — MG MM DIGITAL SCREENING BILAT W/ TOMO AND CAD
7 series · 8 of 23 positions shown · non-contrast
Comparison: Previous exam(s).

CLINICAL DATA: Screening.

EXAM:
DIGITAL SCREENING BILATERAL MAMMOGRAM WITH TOMOSYNTHESIS AND CAD
TECHNIQUE: Bilateral screening digital craniocaudal and mediolateral oblique
mammograms were obtained. Bilateral screening digital breast
tomosynthesis was performed. The images were evaluated with
computer-aided detection.

[L CC synth-2D]
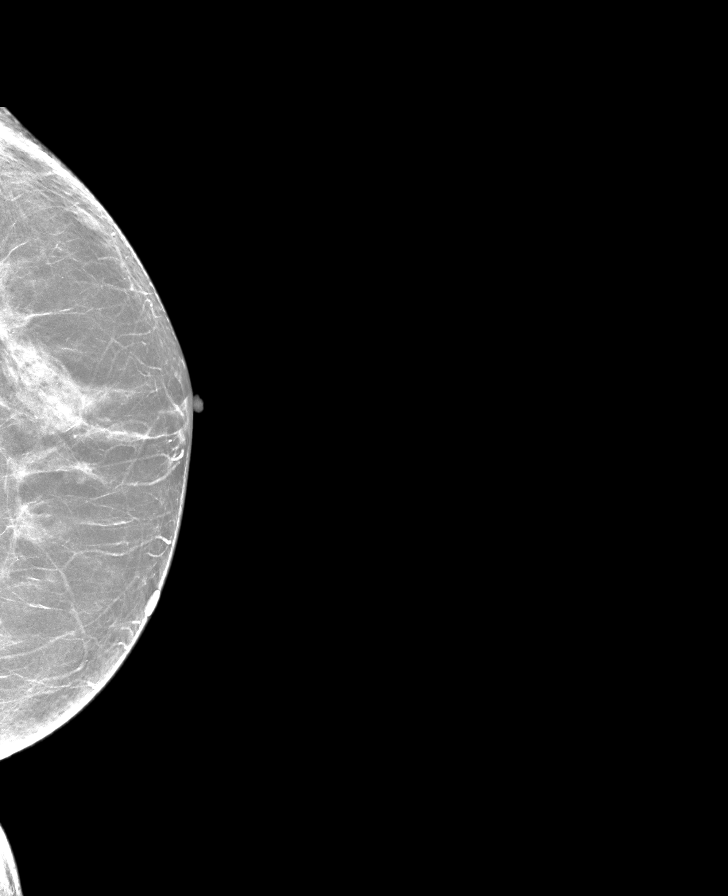

[L MLO synth-2D]
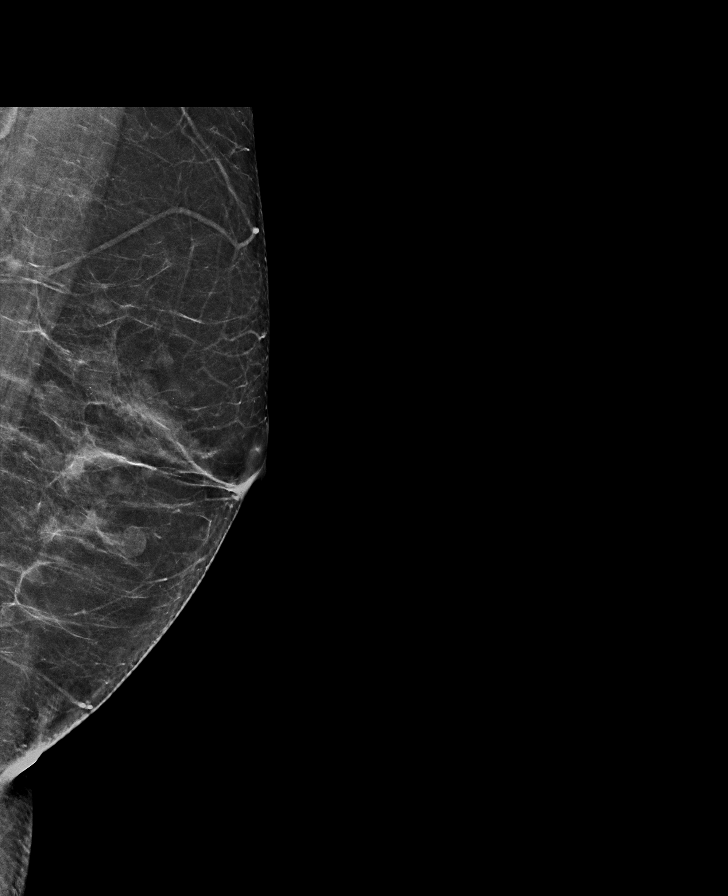

[R CC synth-2D]
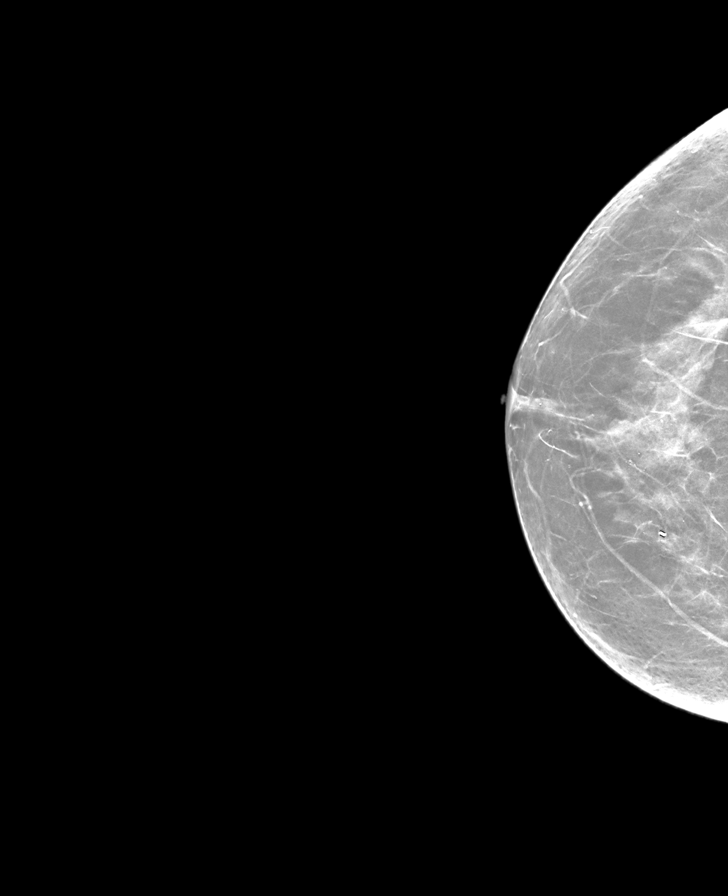

[L CC tomo · 2 of 61 frames shown]
[frame 20/61]
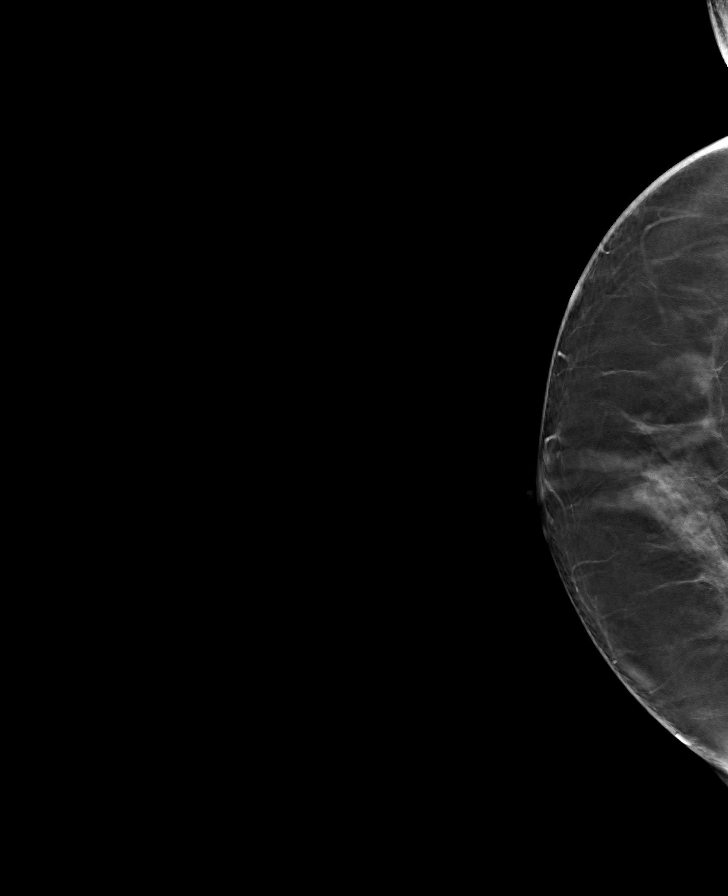
[frame 31/61]
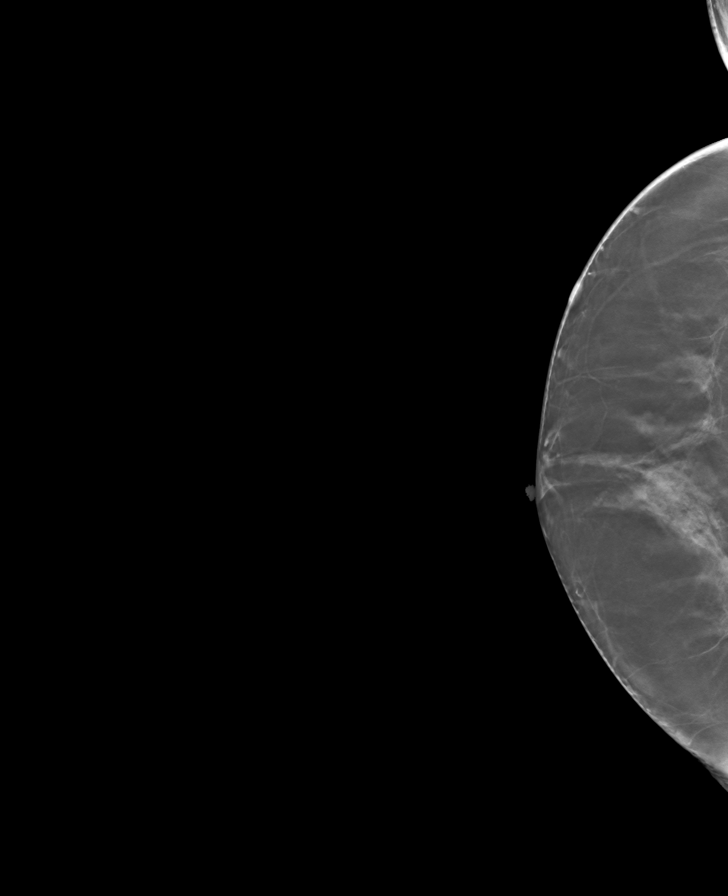

[R MLO tomo · tomo slice 31/61.0]
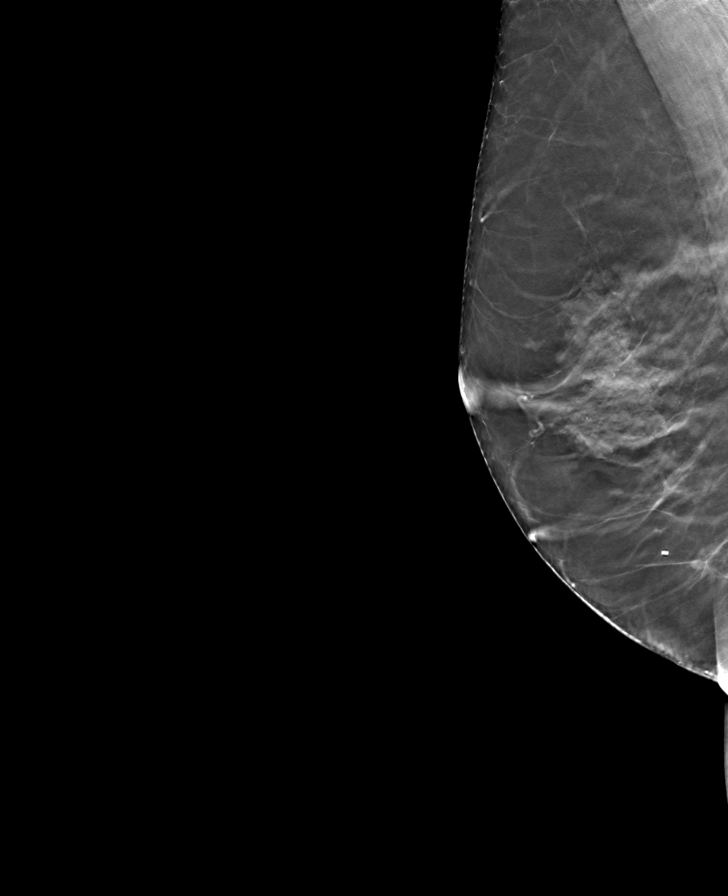

[R CC tomo · tomo slice 33/64.0]
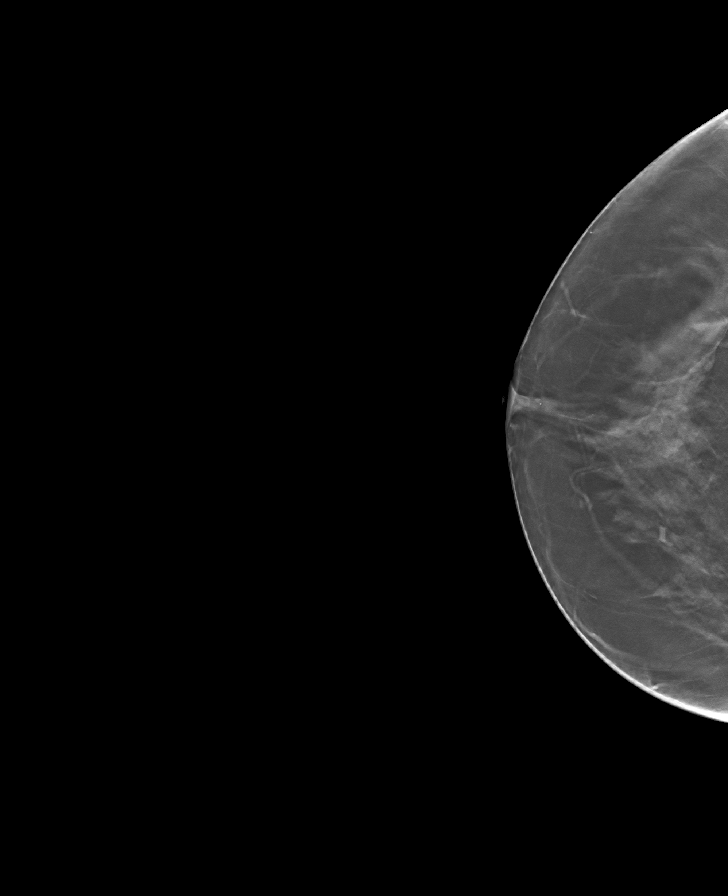

[L MLO tomo · tomo slice 31/61.0]
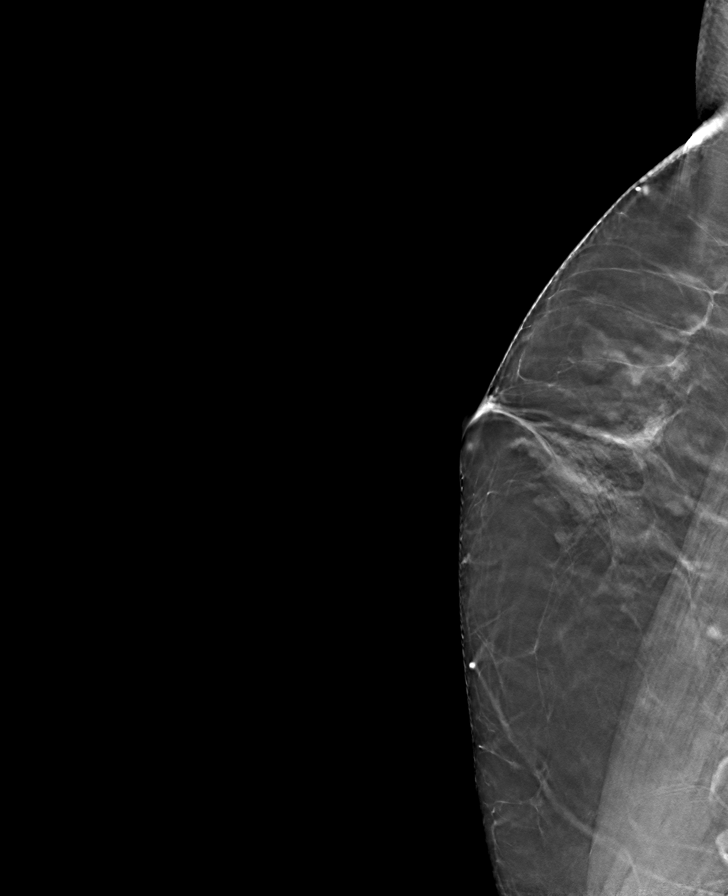

[8 of 23 positions shown; findings below may reference images not displayed]

ACR Breast Density Category b: There are scattered areas of
fibroglandular density.
FINDINGS: There are no findings suspicious for malignancy.
IMPRESSION: No mammographic evidence of malignancy. A result letter of this
screening mammogram will be mailed directly to the patient.

RECOMMENDATION:
Screening mammogram in one year. (Code:51-O-LD2)

BI-RADS CATEGORY  1: Negative.

## 2022-04-21 ENCOUNTER — Other Ambulatory Visit: Payer: Self-pay | Admitting: Family Medicine

## 2022-04-21 DIAGNOSIS — E7800 Pure hypercholesterolemia, unspecified: Secondary | ICD-10-CM

## 2022-05-27 ENCOUNTER — Other Ambulatory Visit: Payer: Self-pay | Admitting: Family Medicine

## 2022-06-07 ENCOUNTER — Encounter: Payer: BC Managed Care – PPO | Admitting: Family Medicine

## 2022-07-19 ENCOUNTER — Other Ambulatory Visit: Payer: Self-pay | Admitting: Family Medicine

## 2022-07-19 DIAGNOSIS — F419 Anxiety disorder, unspecified: Secondary | ICD-10-CM

## 2022-07-19 MED ORDER — CLONAZEPAM 0.5 MG PO TABS: 0.5000 mg | ORAL_TABLET | Freq: Two times a day (BID) | ORAL | 5 refills | Status: AC | PRN

## 2022-08-19 ENCOUNTER — Other Ambulatory Visit: Payer: Self-pay | Admitting: Family Medicine

## 2022-08-22 ENCOUNTER — Encounter (HOSPITAL_BASED_OUTPATIENT_CLINIC_OR_DEPARTMENT_OTHER): Payer: Self-pay | Admitting: Emergency Medicine

## 2022-08-22 ENCOUNTER — Emergency Department (HOSPITAL_BASED_OUTPATIENT_CLINIC_OR_DEPARTMENT_OTHER)
Admission: EM | Admit: 2022-08-22 | Discharge: 2022-08-22 | Disposition: A | Discharge status: Home / Self Care | Payer: BC Managed Care – PPO | Attending: Emergency Medicine | Admitting: Emergency Medicine

## 2022-08-22 ENCOUNTER — Other Ambulatory Visit: Payer: Self-pay

## 2022-08-22 DIAGNOSIS — Z8616 Personal history of COVID-19: Secondary | ICD-10-CM | POA: Diagnosis not present

## 2022-08-22 DIAGNOSIS — B9789 Other viral agents as the cause of diseases classified elsewhere: Secondary | ICD-10-CM | POA: Diagnosis not present

## 2022-08-22 DIAGNOSIS — R0981 Nasal congestion: Secondary | ICD-10-CM | POA: Diagnosis not present

## 2022-08-22 DIAGNOSIS — Z7901 Long term (current) use of anticoagulants: Secondary | ICD-10-CM | POA: Insufficient documentation

## 2022-08-22 DIAGNOSIS — J069 Acute upper respiratory infection, unspecified: Secondary | ICD-10-CM | POA: Diagnosis not present

## 2022-08-22 DIAGNOSIS — U071 COVID-19: Secondary | ICD-10-CM | POA: Insufficient documentation

## 2022-08-22 LAB — SARS CORONAVIRUS 2 BY RT PCR: SARS Coronavirus 2 by RT PCR: POSITIVE — AB
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
## 2022-08-22 NOTE — Discharge Instructions (Signed)
You were seen in emergency department today for nasal congestion and cough.  As we discussed you tested positive for COVID.  Not sure if this test is still positive from your previous infection.  Regardless, we will treat this as a viral infection.  You can take over-the-counter medications as needed like we talked about.  I recommend taking the antibiotics the urgent care gave you just in case this could be a bacterial process.  Make sure you are staying well-hydrated and get plenty of rest.  Continue to monitor how you're doing and return to the ER for new or worsening symptoms.
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
## 2022-08-26 ENCOUNTER — Encounter: Payer: Self-pay | Admitting: Family Medicine

## 2022-08-26 ENCOUNTER — Ambulatory Visit: Payer: BC Managed Care – PPO | Admitting: Family Medicine

## 2022-08-26 VITALS — BP 120/51 | HR 83 | Temp 98.6°F | Resp 16 | Wt 131.0 lb

## 2022-08-26 DIAGNOSIS — U071 COVID-19: Secondary | ICD-10-CM

## 2022-08-26 DIAGNOSIS — H6982 Other specified disorders of Eustachian tube, left ear: Secondary | ICD-10-CM | POA: Diagnosis not present

## 2022-08-26 MED ORDER — BENZONATATE 200 MG PO CAPS: 200.0000 mg | ORAL_CAPSULE | Freq: Two times a day (BID) | ORAL | 0 refills | Status: DC | PRN

## 2022-08-26 MED ORDER — ONDANSETRON 4 MG PO TBDP: 4.0000 mg | ORAL_TABLET | Freq: Three times a day (TID) | ORAL | 0 refills | Status: DC | PRN

## 2022-08-26 MED ORDER — PREDNISONE 20 MG PO TABS: 40.0000 mg | ORAL_TABLET | Freq: Every day | ORAL | 0 refills | Status: AC

## 2022-08-26 NOTE — Patient Instructions (Addendum)
Continue to push fluids, practice good hand hygiene, and cover your mouth if you cough.  If you start having fevers, shaking or shortness of breath, seek immediate care.  OK to take Tylenol 1000 mg (2 extra strength tabs) or 975 mg (3 regular strength tabs) every 6 hours as needed.  As long as you are vomiting or having diarrhea, please drink fluids with electrolytes like Gatorade, Powerade, or Pedialyte (if you can stomach the taste). Drink these along with water mainly.   Let us know if you need anything.
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
## 2022-10-14 ENCOUNTER — Other Ambulatory Visit: Payer: Self-pay | Admitting: Family Medicine

## 2022-10-14 DIAGNOSIS — Z1231 Encounter for screening mammogram for malignant neoplasm of breast: Secondary | ICD-10-CM

## 2022-11-02 DIAGNOSIS — Z882 Allergy status to sulfonamides status: Secondary | ICD-10-CM | POA: Diagnosis not present

## 2022-11-02 DIAGNOSIS — R051 Acute cough: Secondary | ICD-10-CM | POA: Diagnosis not present

## 2022-11-02 DIAGNOSIS — R0981 Nasal congestion: Secondary | ICD-10-CM | POA: Diagnosis not present

## 2022-11-02 DIAGNOSIS — R059 Cough, unspecified: Secondary | ICD-10-CM | POA: Diagnosis not present

## 2022-11-02 DIAGNOSIS — J069 Acute upper respiratory infection, unspecified: Secondary | ICD-10-CM | POA: Diagnosis not present

## 2022-11-02 DIAGNOSIS — J029 Acute pharyngitis, unspecified: Secondary | ICD-10-CM | POA: Diagnosis not present

## 2022-11-02 DIAGNOSIS — Z20822 Contact with and (suspected) exposure to covid-19: Secondary | ICD-10-CM | POA: Diagnosis not present

## 2022-11-02 DIAGNOSIS — B9789 Other viral agents as the cause of diseases classified elsewhere: Secondary | ICD-10-CM | POA: Diagnosis not present

## 2022-11-02 DIAGNOSIS — R509 Fever, unspecified: Secondary | ICD-10-CM | POA: Diagnosis not present

## 2022-11-02 DIAGNOSIS — J028 Acute pharyngitis due to other specified organisms: Secondary | ICD-10-CM | POA: Diagnosis not present

## 2022-11-05 ENCOUNTER — Encounter: Payer: Self-pay | Admitting: Family Medicine

## 2022-11-05 ENCOUNTER — Ambulatory Visit: Payer: BC Managed Care – PPO | Admitting: Family Medicine

## 2022-11-05 VITALS — BP 120/80 | HR 90 | Temp 98.3°F | Ht 62.0 in | Wt 130.0 lb

## 2022-11-05 DIAGNOSIS — J0100 Acute maxillary sinusitis, unspecified: Secondary | ICD-10-CM

## 2022-11-05 DIAGNOSIS — J069 Acute upper respiratory infection, unspecified: Secondary | ICD-10-CM | POA: Diagnosis not present

## 2022-11-05 MED ORDER — PREDNISONE 20 MG PO TABS: 40.0000 mg | ORAL_TABLET | Freq: Every day | ORAL | 0 refills | Status: AC

## 2022-11-05 MED ORDER — PROMETHAZINE-DM 6.25-15 MG/5ML PO SYRP: 5.0000 mL | ORAL_SOLUTION | Freq: Four times a day (QID) | ORAL | 0 refills | Status: DC | PRN

## 2022-11-05 NOTE — Patient Instructions (Signed)
Continue to push fluids, practice good hand hygiene, and cover your mouth if you cough.  If you start having fevers, shaking or shortness of breath, seek immediate care.  Send me a message with the dates in mind, will plan on the start date of 11/02/22 (Tuesday).   OK to take Tylenol 1000 mg (2 extra strength tabs) or 975 mg (3 regular strength tabs) every 6 hours as needed.  Let us know if you need anything.

## 2022-11-05 NOTE — Progress Notes (Signed)
Chief Complaint  Patient presents with   Cough    Congestion Vomiting headaches    Denton Meek here for URI complaints.  Duration: 5 days  Associated symptoms: Fever (102), sinus congestion, rhinorrhea, itchy watery eyes, ear pain, sore throat, shortness of breath, myalgia, and coughing, N/V, abd pain LLQ Denies: ear drainage, wheezing, and diarrhea, sinus pain Treatment to date: Zofran, Tessalon Perles Sick contacts: Yes- grandchild  Neg for flu, covid, rsv, strep at ED. CXR neg.   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   Depression    Diverticulitis    Family history of anesthesia complication    mother difficult to wake   GERD (gastroesophageal reflux disease)    History of blood clots    Hyperlipidemia    Osteoporosis     Objective BP 120/80 (BP Location: Left Arm, Patient Position: Sitting, Cuff Size: Normal)   Pulse 90   Temp 98.3 F (36.8 C) (Oral)   Ht 5\' 2"  (1.575 m)   Wt 130 lb (59 kg)   SpO2 97%   BMI 23.78 kg/m  General: Awake, alert, appears stated age HEENT: AT, [place], ears patent b/l and TM's neg, nares patent w/o discharge, pharynx pink and without exudates, MMM, max sinuses ttp b/l Neck: No masses or asymmetry Heart: RRR Lungs: CTAB, no accessory muscle use Psych: Age appropriate judgment and insight, normal mood and affect  Viral URI with cough - Plan: promethazine-dextromethorphan (PROMETHAZINE-DM) 6.25-15 MG/5ML syrup  Acute maxillary sinusitis, recurrence not specified - Plan: predniSONE (DELTASONE) 20 MG tablet  5 d pred burst 40 mg/d. Syrup as above for cough and nausea. Continue to push fluids, practice good hand hygiene, cover mouth when coughing. F/u prn. If starting to experience worsening s/s's, shaking, or shortness of breath, seek immediate care. Pt voiced understanding and agreement to the plan.  01-31-1992 [place], DO 11/05/22 3:29  PM

## 2022-11-08 ENCOUNTER — Encounter: Payer: Self-pay | Admitting: Family Medicine

## 2022-11-08 ENCOUNTER — Other Ambulatory Visit: Payer: Self-pay | Admitting: Family Medicine

## 2022-11-08 MED ORDER — HYDROCODONE BIT-HOMATROP MBR 5-1.5 MG/5ML PO SOLN: 5.0000 mL | Freq: Three times a day (TID) | ORAL | 0 refills | Status: DC | PRN

## 2022-11-08 MED ORDER — TOBRAMYCIN 0.3 % OP SOLN: 1.0000 [drp] | OPHTHALMIC | 0 refills | Status: DC

## 2022-11-09 ENCOUNTER — Encounter: Payer: Self-pay | Admitting: Family Medicine

## 2022-11-18 ENCOUNTER — Other Ambulatory Visit: Payer: Self-pay | Admitting: Family Medicine

## 2022-11-24 ENCOUNTER — Ambulatory Visit
Admission: RE | Admit: 2022-11-24 | Discharge: 2022-11-24 | Disposition: A | Discharge status: Home / Self Care | Payer: BC Managed Care – PPO | Source: Ambulatory Visit | Attending: Family Medicine | Admitting: Family Medicine

## 2022-11-24 DIAGNOSIS — Z1231 Encounter for screening mammogram for malignant neoplasm of breast: Secondary | ICD-10-CM

## 2022-12-14 ENCOUNTER — Other Ambulatory Visit (HOSPITAL_COMMUNITY): Payer: Self-pay | Admitting: Family Medicine

## 2022-12-14 DIAGNOSIS — Z1231 Encounter for screening mammogram for malignant neoplasm of breast: Secondary | ICD-10-CM

## 2022-12-17 ENCOUNTER — Ambulatory Visit (HOSPITAL_COMMUNITY)
Admission: RE | Admit: 2022-12-17 | Discharge: 2022-12-17 | Disposition: A | Discharge status: Home / Self Care | Payer: BC Managed Care – PPO | Source: Ambulatory Visit | Attending: Family Medicine | Admitting: Family Medicine

## 2022-12-17 DIAGNOSIS — Z1231 Encounter for screening mammogram for malignant neoplasm of breast: Secondary | ICD-10-CM | POA: Diagnosis not present

## 2022-12-20 ENCOUNTER — Ambulatory Visit: Payer: BC Managed Care – PPO | Admitting: Medical

## 2022-12-20 VITALS — BP 115/60 | HR 84 | Temp 98.4°F | Resp 18 | Ht 62.0 in | Wt 132.6 lb

## 2022-12-20 DIAGNOSIS — R6883 Chills (without fever): Secondary | ICD-10-CM | POA: Diagnosis not present

## 2022-12-20 DIAGNOSIS — R52 Pain, unspecified: Secondary | ICD-10-CM | POA: Diagnosis not present

## 2022-12-20 DIAGNOSIS — R110 Nausea: Secondary | ICD-10-CM | POA: Diagnosis not present

## 2022-12-20 DIAGNOSIS — B349 Viral infection, unspecified: Secondary | ICD-10-CM

## 2022-12-20 DIAGNOSIS — R059 Cough, unspecified: Secondary | ICD-10-CM

## 2022-12-20 DIAGNOSIS — R197 Diarrhea, unspecified: Secondary | ICD-10-CM

## 2022-12-20 LAB — POCT INFLUENZA A/B
Influenza A, POC: NEGATIVE
Influenza B, POC: NEGATIVE

## 2022-12-20 LAB — POC COVID19 BINAXNOW: SARS Coronavirus 2 Ag: NEGATIVE

## 2022-12-20 MED ORDER — BENZONATATE 100 MG PO CAPS: 100.0000 mg | ORAL_CAPSULE | Freq: Three times a day (TID) | ORAL | 0 refills | Status: DC | PRN

## 2022-12-20 MED ORDER — FAMOTIDINE 20 MG PO TABS: 20.0000 mg | ORAL_TABLET | Freq: Every day | ORAL | 0 refills | Status: DC

## 2022-12-20 NOTE — Patient Instructions (Addendum)
Viral syndrome. Your flu test and covid test both are negative. Rest hydrate tylenol for fever. Bland and healthy foods. Hydrate well. If you get worse nausea or vomiting rx zofran.  For nasal congestion use flonase and continue mucinex.  If cough worsens start benzonatate  If diarrhea persists turn in stool panel kit by Wednesday.  With your reported abdomen pain will get cbc, cmp and lipase. You may have small early umbilical hernia found incidentally on exam. If your pain in abdomen worsens will get ct abd/pelvis as you mentioned hx of diverticulitis. Current presentation does not indicate need for ct abd/pelvis. Will rx famotadine for epigastic area pain.   Follow up in 7-10 days or sooner if needed.

## 2022-12-20 NOTE — Progress Notes (Signed)
Subjective:    Patient ID: Cathy Simmons, female    DOB: 12/15/1965, 57 y.o.   MRN: [id number]  HPI Pt in with some recent upset stomach, cough,ha, body aches and chills. Checked her temp but no fever. Symptoms for 3 days. Started on Saturday with first symptoms with diffuse body. Mild dry cough intermittent.   No sob or wheezing. Pt has had some loose stools. 4 loose stools today.  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     Review of Systems  Constitutional:  Positive for chills. Negative for fatigue and fever.  HENT:  Negative for congestion and ear pain.   Respiratory:  Positive for cough. Negative for chest tightness, shortness of breath and wheezing.   Cardiovascular:  Negative for chest pain and palpitations.  Gastrointestinal:  Positive for abdominal pain and diarrhea. Negative for blood in stool, nausea and vomiting.  Genitourinary:  Negative for dysuria, flank pain and frequency.  Musculoskeletal:  Positive for myalgias. Negative for back pain.  Neurological:  Negative for dizziness, speech difficulty, weakness, numbness and headaches.  Hematological:  Negative for adenopathy. Does not bruise/bleed easily.  Psychiatric/Behavioral:  Negative for behavioral problems, confusion, dysphoric mood and hallucinations.     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   Depression    Diverticulitis    Family history of anesthesia complication    mother difficult to wake   GERD (gastroesophageal reflux disease)    History of blood clots    Hyperlipidemia    Osteoporosis      Social History   Socioeconomic History   Marital status: Married    Spouse name: Not on file   Number of children: Not on file   Years of education: Not on file   Highest education level: Not on file  Occupational History   Not on  file  Tobacco Use   Smoking status: Former    Packs/day: 0.50    Types: Cigarettes    Quit date: 01/30/1999    Years since quitting: 23.9   Smokeless tobacco: Never  Vaping Use   Vaping Use: Never used  Substance and Sexual Activity   Alcohol use: No   Drug use: No   Sexual activity: Yes  Other Topics Concern   Not on file  Social History Narrative   Not on file   Social Determinants of Health   Financial Resource Strain: Not on file  Food Insecurity: Not on file  Transportation Needs: Not on file  Physical Activity: Not on file  Stress: Not on file  Social Connections: Not on file  Intimate Partner Violence: Not on file    Past Surgical History:  Procedure Laterality Date   BREAST BIOPSY Right    needle core biopsy, benign   BREAST EXCISIONAL BIOPSY Left    2 times left breast, benign both times   BREAST SURGERY  breat biopsy x2 last 1995   DILATION AND CURETTAGE OF UTERUS  x2 1984 and 1991   excision left ganglion cyst Left    FOREIGN BODY REMOVAL Right 01/29/2013   Procedure: EXCISION FOREIGN BODY RIGHT SMALL FINGER;  Surgeon: Nicki Reaper, MD;  Location: [facility];  Service: Orthopedics;  Laterality: Right;   removal spleen  2000    Family  History  Problem Relation Age of Onset   Stroke Mother    Cancer Mother        breast cancer   Breast cancer Mother    Diabetes Father    Parkinson's disease Father    Colon cancer Neg Hx    Rectal cancer Neg Hx    Esophageal cancer Neg Hx    Stomach cancer Neg Hx     Allergies  Allergen Reactions   Bactrim [Sulfamethoxazole-Trimethoprim] Other (See Comments)    Sulfa causes yeast infections!   Adhesive [Tape] Rash and Other (See Comments)    Band-Aids   Cymbalta [Duloxetine Hcl] Anxiety    Current Outpatient Medications on File Prior to Visit  Medication Sig Dispense Refill   albuterol (VENTOLIN HFA) 108 (90 Base) MCG/ACT inhaler Inhale 2 puffs into the lungs every 6 (six) hours as needed for  wheezing or shortness of breath. 6.7 g 0   cetirizine (ZYRTEC) 10 MG tablet Take 10 mg by mouth daily.     Cholecalciferol (VITAMIN D-3) 1000 units CAPS Take 1,000 Units by mouth daily.      clonazePAM (KLONOPIN) 0.5 MG tablet Take 1 tablet (0.5 mg total) by mouth 2 (two) times daily as needed. for anxiety 30 tablet 5   esomeprazole (NEXIUM) 20 MG capsule Take 20 mg by mouth daily at 12 noon.     rosuvastatin (CRESTOR) 20 MG tablet TAKE ONE TABLET BY MOUTH DAILY 90 tablet 3   tobramycin (TOBREX) 0.3 % ophthalmic solution Place 1 drop into both eyes every 4 (four) hours. 5 mL 0   vitamin B-12 (CYANOCOBALAMIN) 1000 MCG tablet Take 500-1,000 mcg by mouth daily.      XARELTO 20 MG TABS tablet TAKE 1 TABLET BY MOUTH ONCE DAILY WITH SUPPER 90 tablet 0   HYDROcodone bit-homatropine (HYCODAN) 5-1.5 MG/5ML syrup Take 5 mLs by mouth every 8 (eight) hours as needed for cough. 120 mL 0   ondansetron (ZOFRAN-ODT) 4 MG disintegrating tablet Take 1 tablet (4 mg total) by mouth every 8 (eight) hours as needed for nausea or vomiting. 20 tablet 0   No current facility-administered medications on file prior to visit.    BP 115/60   Pulse 84   Temp 98.4 F (36.9 C)   Resp 18   Ht 5\' 2"  (1.575 m)   Wt 132 lb 9.6 oz (60.1 kg)   SpO2 97%   BMI 24.25 kg/m        Objective:   Physical Exam  General Mental Status- Alert. General Appearance- Not in acute distress.   Skin General: Color- Normal Color. Moisture- Normal Moisture.  Neck Carotid Arteries- Normal color. Moisture- Normal Moisture. No carotid bruits. No JVD.  Chest and Lung Exam Auscultation: Breath Sounds:-Normal.  Cardiovascular Auscultation:Rythm- Regular. Murmurs & Other Heart Sounds:Auscultation of the heart reveals- No Murmurs.  Abdomen Inspection:-Inspeection Normal. Palpation/Percussion:Note:No mass. Palpation and Percussion of the abdomen reveal- mild Tender epigatric area. No obvious bulge, Non Distended + BS, no rebound or  guarding.   Neurologic Cranial Nerve exam:- CN III-XII intact(No nystagmus), symmetric smile. Strength:- 5/5 equal and symmetric strength both upper and lower extremities.       Assessment & Plan:   Patient Instructions  Viral syndrome. Your flu test and covid test both are negative. Rest hydrate tylenol for fever. Bland and healthy foods. Hydrate well. If you get worse nausea or vomiting rx zofran.  For nasal congestion use flonase and continue mucinex.  If cough worsens start benzonatate  If  diarrhea persists turn in stool panel kit by Wednesday.  With your reported abdomen pain will get cbc, cmp and lipase. You may have small early umbilical hernia found incidentally on exam. If your pain in abdomen worsens will get ct abd/pelvis as you mentioned hx of diverticulitis. Current presentation does not indicate need for ct abd/pelvis. Will rx famotadine for epigastic area pain.   Follow up in 7-10 days or sooner if needed.     Mackie Pai PA-C

## 2022-12-21 LAB — COMPREHENSIVE METABOLIC PANEL
ALT: 15 U/L (ref 0–35)
AST: 19 U/L (ref 0–37)
Albumin: 4.1 g/dL (ref 3.5–5.2)
Alkaline Phosphatase: 88 U/L (ref 39–117)
BUN: 12 mg/dL (ref 6–23)
CO2: 29 mEq/L (ref 19–32)
Calcium: 9.5 mg/dL (ref 8.4–10.5)
Chloride: 103 mEq/L (ref 96–112)
Creatinine, Ser: 0.57 mg/dL (ref 0.40–1.20)
GFR: 100.66 mL/min (ref >60.00)
Glucose, Bld: 77 mg/dL (ref 70–99)
Potassium: 4.3 mEq/L (ref 3.5–5.1)
Sodium: 140 mEq/L (ref 135–145)
Total Bilirubin: 0.3 mg/dL (ref 0.2–1.2)
Total Protein: 7.2 g/dL (ref 6.0–8.3)

## 2022-12-21 LAB — CBC WITH DIFFERENTIAL/PLATELET
Basophils Absolute: 0.1 10*3/uL (ref 0.0–0.1)
Basophils Relative: 1.2 % (ref 0.0–3.0)
Eosinophils Absolute: 0.1 10*3/uL (ref 0.0–0.7)
Eosinophils Relative: 1.2 % (ref 0.0–5.0)
HCT: 42.6 % (ref 36.0–46.0)
Hemoglobin: 14.2 g/dL (ref 12.0–15.0)
Lymphocytes Relative: 32.4 % (ref 12.0–46.0)
Lymphs Abs: 3.5 10*3/uL (ref 0.7–4.0)
MCHC: 33.4 g/dL (ref 30.0–36.0)
MCV: 92.9 fl (ref 78.0–100.0)
Monocytes Absolute: 1.5 10*3/uL — ABNORMAL HIGH (ref 0.1–1.0)
Monocytes Relative: 13.8 % — ABNORMAL HIGH (ref 3.0–12.0)
Neutro Abs: 5.5 10*3/uL (ref 1.4–7.7)
Neutrophils Relative %: 51.4 % (ref 43.0–77.0)
Platelets: 260 10*3/uL (ref 150.0–400.0)
RBC: 4.58 Mil/uL (ref 3.87–5.11)
RDW: 14.3 % (ref 11.5–15.5)
WBC: 10.7 10*3/uL — ABNORMAL HIGH (ref 4.0–10.5)

## 2022-12-21 LAB — LIPASE: Lipase: 21 U/L (ref 11.0–59.0)

## 2022-12-22 ENCOUNTER — Encounter: Payer: Self-pay | Admitting: Medical

## 2023-01-06 ENCOUNTER — Telehealth: Payer: Self-pay

## 2023-01-06 DIAGNOSIS — M7122 Synovial cyst of popliteal space [Baker], left knee: Secondary | ICD-10-CM | POA: Diagnosis not present

## 2023-01-06 DIAGNOSIS — Z882 Allergy status to sulfonamides status: Secondary | ICD-10-CM | POA: Diagnosis not present

## 2023-01-06 DIAGNOSIS — M79652 Pain in left thigh: Secondary | ICD-10-CM | POA: Diagnosis not present

## 2023-01-06 DIAGNOSIS — M79606 Pain in leg, unspecified: Secondary | ICD-10-CM | POA: Diagnosis not present

## 2023-01-06 DIAGNOSIS — M7989 Other specified soft tissue disorders: Secondary | ICD-10-CM | POA: Diagnosis not present

## 2023-01-06 NOTE — Telephone Encounter (Signed)
Nurse Assessment Nurse: Lissa Merlin, RN, Abigail Date/Time (Eastern Time): 01/06/2023 12:35:02 PM Confirm and document reason for call. If symptomatic, describe symptoms. ---Caller states that hse has been having pain in back of her left thigh for 2 weeks. Pain is currenlty 8/10 has been taking tylenol and it has not been helping. States that it just randomly started hurting and the pain is only in the leg. Pain has gone down to the ankle maybe once. Does the patient have any new or worsening symptoms? ---Yes Will a triage be completed? ---Yes Related visit to physician within the last 2 weeks? ---No Does the PT have any chronic conditions? (i.e. diabetes, asthma, this includes High risk factors for pregnancy, etc.) ---Yes List chronic conditions. ---No spleen Blood clots in sinuses Is this a behavioral health or substance abuse call? ---No Guidelines Guideline Title Affirmed Question Affirmed Notes Nurse Date/Time Eilene Ghazi Time) Leg Pain Difficulty breathing Lissa Merlin, RN, Abigail 01/06/2023 12:39:40 PM Disp. Time Eilene Ghazi Time) Disposition Final User 01/06/2023 12:40:54 PM Go to ED Now Yes Lissa Merlin, RN, Abigail PLEASE NOTE: All timestamps contained within this report are represented as [place] Standard Time. CONFIDENTIALTY NOTICE: This fax transmission is intended only for the addressee. It contains information that is legally privileged, confidential or otherwise protected from use or disclosure. If you are not the intended recipient, you are strictly prohibited from reviewing, disclosing, copying using or disseminating any of this information or taking any action in reliance on or regarding this information. If you have received this fax in error, please notify us immediately by telephone so that we can arrange for its return to [place]. Phone: [phone], Toll-Free: [phone], Fax: [phone] Page: 2 of 2 Call Id: [id number] Final Disposition 01/06/2023 12:40:54 PM Go to ED Now Yes Lissa Merlin, RN,  Abigail Caller Disagree/Comply Comply Caller Understands Yes PreDisposition InappropriateToAsk Care Advice Given Per Guideline GO TO ED NOW: * You need to be seen in the Emergency Department. * Go to the ED at ___________ [place] now. Drive carefully. CARE ADVICE given per Leg Pain (Adult) guideline. CALL EMS 911 IF: * Call EMS if you become worse. Referrals GO TO FACILITY UNDECIDED

## 2023-01-06 NOTE — Telephone Encounter (Signed)
Pt has an appt tomorrow °

## 2023-01-07 ENCOUNTER — Encounter: Payer: Self-pay | Admitting: Family Medicine

## 2023-01-07 ENCOUNTER — Ambulatory Visit: Payer: BC Managed Care – PPO | Admitting: Family Medicine

## 2023-01-07 VITALS — BP 114/70 | HR 84 | Temp 98.2°F | Ht 62.0 in | Wt 136.1 lb

## 2023-01-07 DIAGNOSIS — M79652 Pain in left thigh: Secondary | ICD-10-CM | POA: Diagnosis not present

## 2023-01-07 MED ORDER — TRAMADOL HCL 50 MG PO TABS: 50.0000 mg | ORAL_TABLET | Freq: Three times a day (TID) | ORAL | 0 refills | Status: AC | PRN

## 2023-01-07 MED ORDER — PREDNISONE 20 MG PO TABS: 40.0000 mg | ORAL_TABLET | Freq: Every day | ORAL | 0 refills | Status: AC

## 2023-01-07 NOTE — Patient Instructions (Signed)
Ice/cold pack over area for 10-15 min twice daily.  Heat (pad or rice pillow in microwave) over affected area, 10-15 minutes twice daily.   OK to take Tylenol 1000 mg (2 extra strength tabs) or 975 mg (3 regular strength tabs) every 6 hours as needed.  Do not drink alcohol, do any illicit/street drugs, drive or do anything that requires alertness while on this medicine.   Send me a message next week if not improving and we will set you up with the physical therapy team.  Your exam today is not obvious for a ruptured baker's cyst.  Let us know if you need anything.  Semimembranosus Tendinitis Rehab  It is normal to feel mild stretching, pulling, tightness, or discomfort as you do these exercises, but you should stop right away if you feel sudden pain or your pain gets worse.  Stretching and range of motion exercises These exercises warm up your muscles and joints and improve the movement and flexibility of your thigh. These exercises also help to relieve pain, numbness, and tingling. Exercise A: Hamstring stretch, supine    Lie on your back. Loop a belt or towel across the ball of your left / right foot The ball of your foot is on the walking surface, right under your toes. Straighten your left / right knee and slowly pull on the belt to raise your leg. Stop when you feel a gentle stretch behind your left / right knee or thigh. Do not allow the knee to bend. Keep your other leg flat on the floor. Hold this position for 30 seconds. Repeat 2 times. Complete this exercise 3 times a week. Strengthening exercises These exercises build strength and endurance in your thigh. Endurance is the ability to use your muscles for a long time, even after they get tired. Exercise B: Straight leg raises (hip extensors) Lie on your belly on a bed or a firm surface with a pillow under your hips. Bend your left / right knee so your foot is straight up in the air. Squeeze your buttock muscles and lift your  left / right thigh off the bed. Do not let your back arch. Hold this position for 3 seconds. Slowly return to the starting position. Let your muscles relax completely before you do another repetition. Repeat 2 times. Complete this exercise 3 times a week. Exercise C: Bridge (hip extensors)     Lie on your back on a firm surface with your knees bent and your feet flat on the floor. Tighten your buttocks muscles and lift your bottom off the floor until your trunk is level with your thighs. You should feel the muscles working in your buttocks and the back of your thighs. If you do not feel these muscles, slide your feet 1-2 inches (2.5-5 cm) farther away from your buttocks. Do not arch your back. Hold this position for 3 seconds. Slowly lower your hips to the starting position. Let your buttocks muscles relax completely between repetitions. If this exercise is too easy, try doing it with your arms crossed over your chest. Repeat 2 times. Complete this exercise 3 times a week. Exercise D: Hamstring eccentric, prone Lie on your belly on a bed or on the floor. Start with your legs straight. Cross your legs at the ankles with your left / right leg on top. Using your bottom leg to do the work, bend both knees. Using just your left / right leg alone, slowly lower your leg back down toward the bed.  Add a 5 lb weight as told by your health care provider. Let your muscles relax completely between repetitions. Repeat 2 times. Complete this exercise 3 times a week. Exercise E: Squats Stand in front of a table, with your feet and knees pointing straight ahead. You may rest your hands on the table for balance but not for support. Slowly bend your knees and lower your hips like you are going to sit in a chair. Keep your thighs straight or pointed slightly outward. Keep your weight over your heels, not over your toes. Keep your lower legs upright so they are parallel with the table legs. Do not let your  hips go lower than your knees. Stop when your knees are bent to the shape of an upside-down letter "L" (90 degree angle). Do not bend lower than told by your health care provider. If your knee pain increases, do not bend as low. Hold the squat position 1-2 seconds. Slowly push with your legs to return to standing. Do not use your hands to pull yourself to standing. Repeat 2 times. Complete this exercise 3 times a week. Make sure you discuss any questions you have with your health care provider. Document Released: 11/15/2005 Document Revised: 07/22/2016 Document Reviewed: 08/19/2015 Elsevier Interactive Patient Education  [employer].

## 2023-01-07 NOTE — Progress Notes (Signed)
Musculoskeletal Exam  Patient: Cathy Simmons DOB: [birth year]  DOS: 01/07/2023  SUBJECTIVE:  Chief Complaint:   Chief Complaint  Patient presents with   Leg Pain    Cathy Simmons is a 57 y.o.  female for evaluation and treatment of LLE pain.   Onset:  2 weeks ago. No inj or change in activity.  Location: distal hamstring Character:  aching  Progression of issue:  has worsened Associated symptoms: Difficulty walking No bruising, swelling, redness, or fevers. Treatment: to date has been ice and acetaminophen.   Neurovascular symptoms: no She went to the emergency department yesterday and had an ultrasound done that did not show DVT.  She has been compliant with her Xarelto.  She states she was told she had a possible ruptured Baker's cyst.  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 No acute distress. Thorax & Lungs: No accessory muscle use Musculoskeletal: Left knee/hamstring.   Normal active range of motion: yes.   Normal passive range of motion: yes Tenderness to palpation: Yes over the distal hamstring with less TTP over the popliteal region Deformity: no Ecchymosis: no Tests positive: None Tests negative: [place] Neurologic: Normal sensory function.  Gait is antalgic. Psychiatric: Normal mood. Age appropriate judgment and insight. Alert & oriented x 3.    Assessment:  Pain of left thigh - Plan:  traMADol (ULTRAM) 50 MG tablet, predniSONE (DELTASONE) 20 MG tablet  Plan: 5-day prednisone burst as she is on Xarelto.  Tramadol as needed, she has tolerated this well in the past.  Stretches/exercises, heat, ice, Tylenol.  She will send a message in a few days if no improvement and we will set her up with the sports medicine team.  I do not think she has a ruptured Baker's cyst as there is no swelling or bruising.  Her pain is more over the hamstring region rather than the popliteal region as well. F/u as originally scheduled. The patient voiced understanding and agreement to the plan.   [place], DO 01/07/23  11:58 AM

## 2023-01-19 ENCOUNTER — Ambulatory Visit: Payer: BC Managed Care – PPO | Admitting: Family Medicine

## 2023-01-19 ENCOUNTER — Encounter: Payer: Self-pay | Admitting: Family Medicine

## 2023-01-19 VITALS — BP 110/78 | HR 86 | Temp 98.0°F | Ht 62.0 in | Wt 133.5 lb

## 2023-01-19 DIAGNOSIS — J029 Acute pharyngitis, unspecified: Secondary | ICD-10-CM | POA: Diagnosis not present

## 2023-01-19 DIAGNOSIS — R0981 Nasal congestion: Secondary | ICD-10-CM

## 2023-01-19 DIAGNOSIS — R110 Nausea: Secondary | ICD-10-CM | POA: Diagnosis not present

## 2023-01-19 LAB — POCT INFLUENZA A/B
Influenza A, POC: NEGATIVE
Influenza B, POC: NEGATIVE

## 2023-01-19 LAB — POC COVID19 BINAXNOW: SARS Coronavirus 2 Ag: NEGATIVE

## 2023-01-19 LAB — POCT RAPID STREP A (OFFICE): Rapid Strep A Screen: NEGATIVE

## 2023-01-19 MED ORDER — ONDANSETRON 4 MG PO TBDP: 4.0000 mg | ORAL_TABLET | Freq: Three times a day (TID) | ORAL | 0 refills | Status: DC | PRN

## 2023-01-19 MED ORDER — AMOXICILLIN 500 MG PO CAPS: 1000.0000 mg | ORAL_CAPSULE | Freq: Every day | ORAL | 0 refills | Status: AC

## 2023-01-19 NOTE — Progress Notes (Signed)
Chief Complaint  Patient presents with   Sore Throat    Headache Fatigue  Ear pain  Congestion Nausea     Cathy Simmons here for URI complaints.  Duration: 1 day  Associated symptoms: Fever (99.8 F), sinus congestion, sinus pain, itchy watery eyes, ear pain, sore throat, myalgia, and N/V Denies: rhinorrhea, ear drainage, wheezing, shortness of breath, coughing, and diarrhea Treatment to date: none Sick contacts: Yes; grand son - covid test at work.   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   Depression    Diverticulitis    Family history of anesthesia complication    mother difficult to wake   GERD (gastroesophageal reflux disease)    History of blood clots    Hyperlipidemia    Osteoporosis     Objective BP 110/78 (BP Location: Right Arm, Patient Position: Sitting, Cuff Size: Normal)   Pulse 86   Temp 98 F (36.7 C) (Oral)   Ht 5' 2"$  (1.575 m)   Wt 133 lb 8 oz (60.6 kg)   SpO2 98%   BMI 24.42 kg/m  General: Awake, alert, appears stated age HEENT: AT, [place], ears patent b/l and TM's neg, nares patent w/o discharge, pharynx erythematous, tonsils 2+ bilaterally and with exudates, more prominent on the right, MMM Neck: + Tender cervical lymph nodes bilaterally Heart: RRR Lungs: CTAB, no accessory muscle use Psych: Age appropriate judgment and insight, normal mood and affect  Sore throat - Plan: POC COVID-19, POCT Influenza A/B  Nasal congestion - Plan: POC COVID-19, POCT Influenza A/B  3/4 [place] criteria.  10 days of amoxicillin.  Zofran as needed for nausea.  Flu and COVID testing negative today.  Letter for work given, return in 2 days.  Throw toothbrush head/replace toothbrush after 24 hours of being on antibiotic.  Continue to push fluids, practice good hand hygiene, use Tylenol for pain. F/u prn. If starting to experience worsening symptoms, shaking, or shortness of  breath, seek immediate care. Pt voiced understanding and agreement to the plan.  [place], DO 01/19/23 11:28 AM

## 2023-01-19 NOTE — Patient Instructions (Addendum)
Continue to push fluids, practice good hand hygiene, and cover your mouth if you cough.  If you start having fevers, shaking or shortness of breath, seek immediate care.  Consider throat lozenges, salt water gargles and an air humidifier for symptomatic care.   Throw out your toothbrush/replace the head after 24 hrs of being on the antibiotic.  OK to take Tylenol 1000 mg (2 extra strength tabs) or 975 mg (3 regular strength tabs) every 6 hours as needed.  Let us know if you need anything.

## 2023-01-28 ENCOUNTER — Other Ambulatory Visit: Payer: Self-pay | Admitting: Family Medicine

## 2023-01-28 MED ORDER — FLUCONAZOLE 150 MG PO TABS: ORAL_TABLET | ORAL | 0 refills | Status: DC

## 2023-02-18 ENCOUNTER — Other Ambulatory Visit: Payer: Self-pay | Admitting: Family Medicine

## 2023-02-21 ENCOUNTER — Encounter: Payer: Self-pay | Admitting: Family Medicine

## 2023-02-21 DIAGNOSIS — E7800 Pure hypercholesterolemia, unspecified: Secondary | ICD-10-CM

## 2023-02-21 MED ORDER — ROSUVASTATIN CALCIUM 20 MG PO TABS: 20.0000 mg | ORAL_TABLET | Freq: Every day | ORAL | 3 refills | Status: DC

## 2023-03-21 ENCOUNTER — Ambulatory Visit: Payer: BC Managed Care – PPO | Admitting: Family Medicine

## 2023-03-21 ENCOUNTER — Encounter: Payer: Self-pay | Admitting: Family Medicine

## 2023-03-21 VITALS — BP 110/70 | HR 83 | Temp 98.3°F | Ht 62.0 in | Wt 136.0 lb

## 2023-03-21 DIAGNOSIS — J0100 Acute maxillary sinusitis, unspecified: Secondary | ICD-10-CM

## 2023-03-21 DIAGNOSIS — I889 Nonspecific lymphadenitis, unspecified: Secondary | ICD-10-CM | POA: Diagnosis not present

## 2023-03-21 LAB — POCT INFLUENZA A/B
Influenza A, POC: NEGATIVE
Influenza B, POC: NEGATIVE

## 2023-03-21 LAB — POC COVID19 BINAXNOW: SARS Coronavirus 2 Ag: NEGATIVE

## 2023-03-21 MED ORDER — PREDNISONE 20 MG PO TABS: 40.0000 mg | ORAL_TABLET | Freq: Every day | ORAL | 0 refills | Status: AC

## 2023-03-21 MED ORDER — FLUCONAZOLE 150 MG PO TABS: ORAL_TABLET | ORAL | 0 refills | Status: DC

## 2023-03-21 MED ORDER — AMOXICILLIN-POT CLAVULANATE 875-125 MG PO TABS: 1.0000 | ORAL_TABLET | Freq: Two times a day (BID) | ORAL | 0 refills | Status: DC

## 2023-03-21 NOTE — Progress Notes (Signed)
Chief Complaint  Patient presents with   Headache    Body aches Congestion     Denton Meek here for URI complaints.  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   Depression    Diverticulitis    Family history of anesthesia complication    mother difficult to wake   GERD (gastroesophageal reflux disease)    History of blood clots    Hyperlipidemia    Osteoporosis     Objective BP 110/70 (BP Location: Left Arm, Patient Position: Sitting, Cuff Size: Normal)   Pulse 83   Temp 98.3 F (36.8 C) (Oral)   Ht  (1.575 m)   Wt 136 lb (61.7 kg)   SpO2 97%   BMI 24.87 kg/m  General: Awake, alert, appears stated age HEENT: AT, [place], ears patent b/l and TM's neg, nares patent w/o discharge, pharynx pink and without exudates, MMM, ttp over L max sinus Neck: enlarged submand glands w ttp, more pronounced on L Heart: RRR Lungs: CTAB, no accessory muscle use Psych: Age appropriate judgment and insight, normal mood and affect  Lymphadenitis - Plan: predniSONE (DELTASONE) 20 MG tablet, amoxicillin-clavulanate (AUGMENTIN) 875-125 MG tablet  Acute maxillary sinusitis, recurrence not specified - Plan: predniSONE (DELTASONE) 20 MG tablet, amoxicillin-clavulanate (AUGMENTIN) 875-125 MG tablet  Continue to push fluids, practice good hand hygiene, cover mouth when coughing. 5 d pred burst 40 mg/d. If no better in 2 d, will start 7 d of Augmentin, Diflucan prn.  F/u prn. If starting to experience fevers, shaking, or worsening shortness of breath, seek immediate care. Pt voiced understanding and  agreement to the plan.  Jilda Roche [place], DO 03/21/23 2:36 PM

## 2023-03-21 NOTE — Patient Instructions (Signed)
Continue to push fluids, practice good hand hygiene, and cover your mouth if you cough. ? ?If you start having fevers, shaking or shortness of breath, seek immediate care. ? ?OK to take Tylenol 1000 mg (2 extra strength tabs) or 975 mg (3 regular strength tabs) every 6 hours as needed. ? ?Let us know if you need anything. ?

## 2023-03-23 ENCOUNTER — Encounter: Payer: Self-pay | Admitting: Family Medicine

## 2023-03-23 ENCOUNTER — Other Ambulatory Visit: Payer: Self-pay | Admitting: Family Medicine

## 2023-03-23 MED ORDER — DOXYCYCLINE HYCLATE 100 MG PO TABS: 100.0000 mg | ORAL_TABLET | Freq: Two times a day (BID) | ORAL | 0 refills | Status: AC

## 2023-03-23 MED ORDER — PROMETHAZINE-DM 6.25-15 MG/5ML PO SYRP: 5.0000 mL | ORAL_SOLUTION | Freq: Four times a day (QID) | ORAL | 0 refills | Status: DC | PRN

## 2023-04-04 ENCOUNTER — Encounter: Payer: Self-pay | Admitting: Family Medicine

## 2023-04-04 ENCOUNTER — Ambulatory Visit (INDEPENDENT_AMBULATORY_CARE_PROVIDER_SITE_OTHER): Payer: BC Managed Care – PPO | Admitting: Family Medicine

## 2023-04-04 VITALS — BP 121/80 | HR 86 | Temp 99.7°F | Ht 62.0 in | Wt 135.4 lb

## 2023-04-04 DIAGNOSIS — A084 Viral intestinal infection, unspecified: Secondary | ICD-10-CM | POA: Diagnosis not present

## 2023-04-04 MED ORDER — ONDANSETRON 4 MG PO TBDP
4.0000 mg | ORAL_TABLET | Freq: Three times a day (TID) | ORAL | 0 refills | Status: DC | PRN
Start: 2023-04-04 — End: 2023-08-02

## 2023-04-04 MED ORDER — DICYCLOMINE HCL 10 MG PO CAPS
ORAL_CAPSULE | ORAL | 0 refills | Status: DC
Start: 2023-04-04 — End: 2024-04-09

## 2023-04-04 NOTE — Progress Notes (Signed)
Chief Complaint  Patient presents with   Headache    Nausea/vomiting     Subjective: Patient is a 56 y.o. female here for HA.  2 d ago had a mild headache. Had some associated N/V. Has lower abd pain without urinary complaints. +crampy. No bleeding, fevers, diarrhea. No known sick contacts. No new meds.   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 No accessory muscle use Mouth: MMM Ab: BS+, S, NT, ND Psych: Age appropriate judgment and insight, normal affect and mood  Assessment and Plan: Viral gastroenteritis - Plan: dicyclomine (BENTYL) 10 MG capsule, ondansetron (ZOFRAN-ODT) 4 MG disintegrating tablet  Slightly improved s/s's today. Cont supportive care + above prn. Send message if no improvement. Warning signs and symptoms verbalized. The patient voiced understanding and agreement to the plan.  Jilda Roche [place], DO 04/04/23  3:30 PM

## 2023-04-04 NOTE — Patient Instructions (Signed)
OK to take Tylenol 1000 mg (2 extra strength tabs) or 975 mg (3 regular strength tabs) every 6 hours as needed.  As long as you are vomiting or having diarrhea, please drink fluids with electrolytes like Gatorade, Powerade, or Pedialyte (if you can stomach the taste). Drink these along with water mainly.   Let us know if you need anything.

## 2023-05-18 ENCOUNTER — Other Ambulatory Visit: Payer: Self-pay | Admitting: Family Medicine

## 2023-07-01 ENCOUNTER — Telehealth: Payer: Self-pay | Admitting: Family Medicine

## 2023-07-01 NOTE — Telephone Encounter (Signed)
PCP completed the form Faxed to dental office at [phone] Patient informed

## 2023-07-01 NOTE — Telephone Encounter (Signed)
Pt dropped off document to be filled out by provider (Clearance form from [facility] -1 page) Pt would like to have document faxed to dental office at [phone]. Document put at front office tray under providers name.

## 2023-08-02 ENCOUNTER — Encounter: Payer: Self-pay | Admitting: Family Medicine

## 2023-08-02 ENCOUNTER — Ambulatory Visit: Payer: BC Managed Care – PPO | Admitting: Family Medicine

## 2023-08-02 VITALS — BP 108/64 | HR 91 | Temp 98.2°F | Ht 62.0 in | Wt 139.1 lb

## 2023-08-02 DIAGNOSIS — G4489 Other headache syndrome: Secondary | ICD-10-CM

## 2023-08-02 DIAGNOSIS — R1110 Vomiting, unspecified: Secondary | ICD-10-CM

## 2023-08-02 DIAGNOSIS — R059 Cough, unspecified: Secondary | ICD-10-CM

## 2023-08-02 LAB — POC COVID19 BINAXNOW: SARS Coronavirus 2 Ag: NEGATIVE

## 2023-08-02 MED ORDER — ONDANSETRON 4 MG PO TBDP: 4.0000 mg | ORAL_TABLET | Freq: Three times a day (TID) | ORAL | 0 refills | Status: DC | PRN

## 2023-08-02 MED ORDER — PREDNISONE 20 MG PO TABS: 40.0000 mg | ORAL_TABLET | Freq: Every day | ORAL | 0 refills | Status: AC

## 2023-08-02 NOTE — Patient Instructions (Signed)
OK to take Tylenol 1000 mg (2 extra strength tabs) or 975 mg (3 regular strength tabs) every 6 hours as needed.  Continue to push fluids, practice good hand hygiene, and cover your mouth if you cough.  If you start having fevers, shaking or shortness of breath, seek immediate care.  Let us know if you need anything.

## 2023-08-02 NOTE — Progress Notes (Signed)
Chief Complaint  Patient presents with   Eye Drainage    Vomiting yesterday Diarrhea yesterday and today Headache  Cough       Subjective Cathy Simmons is a 57 y.o. female who presents with vomiting and diarrhea Symptoms began 3 d ago.  Patient has vomiting, diarrhea, URI symptoms, myalgias, LLQ abd pain, L ear pain, runny/stuffy nose, and cough Patient denies SOB, wheezing, ST, ear drainage, loss of smell/taste Treatment to date: Mucinex, Tylenol Sick contacts: covid going around at work Tested negative for COVID at home.  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   Depression    Diverticulitis    Family history of anesthesia complication    mother difficult to wake   GERD (gastroesophageal reflux disease)    History of blood clots    Hyperlipidemia    Osteoporosis     Exam BP 108/64 (BP Location: Left Arm, Patient Position: Sitting, Cuff Size: Normal)   Pulse 91   Temp 98.2 F (36.8 C) (Oral)   Ht 5\' 2"  (1.575 m)   Wt 139 lb 2 oz (63.1 kg)   SpO2 98%   BMI 25.45 kg/m  General:  well developed, well hydrated, in no apparent distress Skin:  warm, no pallor or diaphoresis, no rashes Ears: Canals are patent bilaterally, right TM unremarkable, left TM mildly retracted without erythema or effusion Nose: Nares are patent, no rhinorrhea + TTP over the left maxillary sinus Throat/Pharynx:  lips and gingiva without lesion; tongue and uvula midline; non-inflamed pharynx; no exudates or postnasal drainage Lungs:  clear to auscultation, breath sounds equal bilaterally, no respiratory distress, no wheezes Cardio:  RRR Abdomen:  abdomen soft, TTP in left lower quadrant; bowel sounds normal; no masses or organomegaly Psych: Appropriate judgement/insight  Assessment and Plan  Other headache syndrome - Plan: POC COVID-19  Vomiting, unspecified vomiting type, unspecified whether nausea  present - Plan: POC COVID-19  Cough, unspecified type - Plan: POC COVID-19  COVID testing is negative.  Zofran for nausea.  Prednisone for upper airway symptoms including sinus pain and likely eustachian tube dysfunction. Avoid aggravating foods, discussed advancing diet. F/u if symptoms fail to improve, sooner if worsening. The patient voiced understanding and agreement to the plan.  Jilda Roche [place], DO 08/02/23  3:24 PM

## 2023-08-08 ENCOUNTER — Ambulatory Visit: Payer: BC Managed Care – PPO | Admitting: Physician Assistant

## 2023-08-08 ENCOUNTER — Encounter: Payer: Self-pay | Admitting: Physician Assistant

## 2023-08-08 ENCOUNTER — Telehealth: Payer: Self-pay | Admitting: Family Medicine

## 2023-08-08 VITALS — BP 116/72 | HR 85 | Temp 99.1°F | Resp 20 | Wt 140.8 lb

## 2023-08-08 DIAGNOSIS — R3129 Other microscopic hematuria: Secondary | ICD-10-CM

## 2023-08-08 DIAGNOSIS — R1030 Lower abdominal pain, unspecified: Secondary | ICD-10-CM

## 2023-08-08 DIAGNOSIS — R079 Chest pain, unspecified: Secondary | ICD-10-CM

## 2023-08-08 LAB — POCT URINALYSIS DIP (MANUAL ENTRY)
Bilirubin, UA: NEGATIVE
Glucose, UA: NEGATIVE mg/dL
Ketones, POC UA: NEGATIVE mg/dL
Leukocytes, UA: NEGATIVE
Nitrite, UA: NEGATIVE
Protein Ur, POC: NEGATIVE mg/dL
Spec Grav, UA: 1.01 (ref 1.010–1.025)
Urobilinogen, UA: 0.2 U/dL
pH, UA: 6.5 (ref 5.0–8.0)

## 2023-08-08 LAB — URINALYSIS, MICROSCOPIC ONLY

## 2023-08-08 LAB — COMPREHENSIVE METABOLIC PANEL
ALT: 21 U/L (ref 0–35)
AST: 19 U/L (ref 0–37)
Albumin: 3.9 g/dL (ref 3.5–5.2)
Alkaline Phosphatase: 87 U/L (ref 39–117)
BUN: 11 mg/dL (ref 6–23)
CO2: 32 meq/L (ref 19–32)
Calcium: 9.3 mg/dL (ref 8.4–10.5)
Chloride: 101 meq/L (ref 96–112)
Creatinine, Ser: 0.61 mg/dL (ref 0.40–1.20)
GFR: 98.59 mL/min (ref >60.00)
Glucose, Bld: 81 mg/dL (ref 70–99)
Potassium: 4 meq/L (ref 3.5–5.1)
Sodium: 140 meq/L (ref 135–145)
Total Bilirubin: 0.3 mg/dL (ref 0.2–1.2)
Total Protein: 7 g/dL (ref 6.0–8.3)

## 2023-08-08 LAB — CBC WITH DIFFERENTIAL/PLATELET
Basophils Absolute: 0 10*3/uL (ref 0.0–0.1)
Basophils Relative: 0.4 % (ref 0.0–3.0)
Eosinophils Absolute: 0.1 10*3/uL (ref 0.0–0.7)
Eosinophils Relative: 1.2 % (ref 0.0–5.0)
HCT: 44.4 % (ref 36.0–46.0)
Hemoglobin: 14.6 g/dL (ref 12.0–15.0)
Lymphocytes Relative: 34.5 % (ref 12.0–46.0)
Lymphs Abs: 2.8 10*3/uL (ref 0.7–4.0)
MCHC: 32.9 g/dL (ref 30.0–36.0)
MCV: 94.7 fl (ref 78.0–100.0)
Monocytes Absolute: 0.8 10*3/uL (ref 0.1–1.0)
Monocytes Relative: 10.4 % (ref 3.0–12.0)
Neutro Abs: 4.3 10*3/uL (ref 1.4–7.7)
Neutrophils Relative %: 53.5 % (ref 43.0–77.0)
Platelets: 297 10*3/uL (ref 150.0–400.0)
RBC: 4.69 Mil/uL (ref 3.87–5.11)
RDW: 13.8 % (ref 11.5–15.5)
WBC: 8.1 10*3/uL (ref 4.0–10.5)

## 2023-08-08 NOTE — Telephone Encounter (Signed)
FMLA PCP completed FMLA Faxed to [employer] at [phone] Received fax confirmation

## 2023-08-08 NOTE — Progress Notes (Addendum)
Established patient visit   Patient: Cathy Simmons   DOB: Feb 07, 1966   57 y.o. Female  MRN: [id number] Visit Date: 08/08/2023  Today's healthcare provider: Alfredia Ferguson, PA-C   Cc. Chest pain, back pain, abdominal pain  Subjective    HPI  Pt reports having a cold a few days ago, that her cough has resolved, some persistent nasal congestion. For the past few days she has had chest pain, thoracic pack pain, and left sided lower abdominal pain. Reports the chest/back pain are random, but tend to occur when she is lying down. Reports associated diarrhea with the abdominal pain, denies blood in stool, but reports two episodes of incontinence. History of diverticulitis.   She has a history of acid reflux and reports worsening symptoms as well.  Medications: Outpatient Medications Prior to Visit  Medication Sig   albuterol (VENTOLIN HFA) 108 (90 Base) MCG/ACT inhaler Inhale 2 puffs into the lungs every 6 (six) hours as needed for wheezing or shortness of breath.   cetirizine (ZYRTEC) 10 MG tablet Take 10 mg by mouth daily.   clonazePAM (KLONOPIN) 0.5 MG tablet Take 1 tablet (0.5 mg total) by mouth 2 (two) times daily as needed. for anxiety   esomeprazole (NEXIUM) 20 MG capsule Take 20 mg by mouth daily at 12 noon.   famotidine (PEPCID) 20 MG tablet Take 1 tablet (20 mg total) by mouth daily.   fluconazole (DIFLUCAN) 150 MG tablet Take 1 tab, repeat in 72 hours if no improvement.   ondansetron (ZOFRAN-ODT) 4 MG disintegrating tablet Take 1 tablet (4 mg total) by mouth every 8 (eight) hours as needed for nausea or vomiting.   promethazine-dextromethorphan (PROMETHAZINE-DM) 6.25-15 MG/5ML syrup Take 5 mLs by mouth 4 (four) times daily as needed for cough. (Patient not taking: Reported on 08/08/2023)   rosuvastatin (CRESTOR) 20 MG tablet Take 1 tablet (20 mg total) by mouth daily.   vitamin B-12 (CYANOCOBALAMIN) 1000 MCG tablet Take 500-1,000 mcg by mouth daily.    XARELTO 20 MG TABS  tablet TAKE 1 TABLET BY MOUTH ONCE DAILY WITH SUPPER   benzonatate (TESSALON) 100 MG capsule Take 1 capsule (100 mg total) by mouth 3 (three) times daily as needed for cough. (Patient not taking: Reported on 08/08/2023)   Cholecalciferol (VITAMIN D-3) 1000 units CAPS Take 1,000 Units by mouth daily.  (Patient not taking: Reported on 08/08/2023)   dicyclomine (BENTYL) 10 MG capsule Take 1 tab every 6 hours as needed for abdominal cramping. (Patient not taking: Reported on 08/08/2023)   tobramycin (TOBREX) 0.3 % ophthalmic solution Place 1 drop into both eyes every 4 (four) hours. (Patient not taking: Reported on 08/08/2023)   No facility-administered medications prior to visit.    Review of Systems  Constitutional:  Negative for fatigue and fever.  HENT:  Positive for congestion.   Respiratory:  Negative for cough and shortness of breath.   Cardiovascular:  Positive for chest pain. Negative for leg swelling.  Gastrointestinal:  Positive for abdominal pain.  Musculoskeletal:  Positive for back pain.  Neurological:  Negative for dizziness and headaches.      Objective    BP 116/72 (BP Location: Left Arm, Patient Position: Sitting, Cuff Size: Small)   Pulse 85   Temp 99.1 F (37.3 C) (Oral)   Resp 20   Wt 140 lb 12.8 oz (63.9 kg)   SpO2 98%   BMI 25.75 kg/m   Physical Exam Constitutional:      General: She is  awake.     Appearance: She is well-developed.  HENT:     Head: Normocephalic.  Eyes:     Conjunctiva/sclera: Conjunctivae normal.  Cardiovascular:     Rate and Rhythm: Normal rate and regular rhythm.     Heart sounds: Normal heart sounds.  Pulmonary:     Effort: Pulmonary effort is normal.     Breath sounds: Normal breath sounds.  Abdominal:     Palpations: Abdomen is soft.     Tenderness: There is abdominal tenderness in the left lower quadrant.  Skin:    General: Skin is warm.  Neurological:     Mental Status: She is alert and oriented to person, place, and time.   Psychiatric:        Attention and Perception: Attention normal.        Mood and Affect: Mood normal.        Speech: Speech normal.        Behavior: Behavior is cooperative.      No results found for any visits on 08/08/23.  Assessment & Plan     1. Chest pain/back pain unspecified type EKG performed today for chest pain Normal sinus rhythm, stable compared to 10/28/2021 .  Lungs clear, afebrile, cough resolved, low sus of pneumonia  Recommending adding pepcid 20 mg BID to her nexium   2. Lower abdominal pain UA today neg leuk, + small blood. Urine culture + urine micro ordered  History of divertic, ordering cbc/cmp Advised all liquid diet for 2-3 days.   - POCT urinalysis dipstick - CBC w/Diff - Comp Met (CMET)   Return if symptoms worsen or fail to improve.      I, Alfredia Ferguson, PA-C have reviewed all documentation for this visit. The documentation on  08/08/23   for the exam, diagnosis, procedures, and orders are all accurate and complete.    Alfredia Ferguson, PA-C  [facility] [phone] (phone) [phone] (fax)  [facility]

## 2023-08-09 LAB — URINE CULTURE
MICRO NUMBER:: 15439181
Result:: NO GROWTH
SPECIMEN QUALITY:: ADEQUATE

## 2023-08-10 ENCOUNTER — Ambulatory Visit (HOSPITAL_BASED_OUTPATIENT_CLINIC_OR_DEPARTMENT_OTHER)
Admission: RE | Admit: 2023-08-10 | Discharge: 2023-08-10 | Disposition: A | Discharge status: Home / Self Care | Payer: BC Managed Care – PPO | Source: Ambulatory Visit | Attending: Physician Assistant | Admitting: Physician Assistant

## 2023-08-10 ENCOUNTER — Other Ambulatory Visit: Payer: Self-pay | Admitting: Physician Assistant

## 2023-08-10 ENCOUNTER — Other Ambulatory Visit: Payer: Self-pay

## 2023-08-10 DIAGNOSIS — R1030 Lower abdominal pain, unspecified: Secondary | ICD-10-CM | POA: Diagnosis not present

## 2023-08-10 DIAGNOSIS — R109 Unspecified abdominal pain: Secondary | ICD-10-CM | POA: Diagnosis not present

## 2023-08-10 DIAGNOSIS — R3129 Other microscopic hematuria: Secondary | ICD-10-CM

## 2023-08-10 DIAGNOSIS — R197 Diarrhea, unspecified: Secondary | ICD-10-CM

## 2023-08-10 DIAGNOSIS — M549 Dorsalgia, unspecified: Secondary | ICD-10-CM | POA: Diagnosis not present

## 2023-08-10 DIAGNOSIS — R319 Hematuria, unspecified: Secondary | ICD-10-CM | POA: Diagnosis not present

## 2023-08-10 DIAGNOSIS — N200 Calculus of kidney: Secondary | ICD-10-CM | POA: Diagnosis not present

## 2023-08-10 NOTE — Addendum Note (Signed)
Addended by: Rosita Kea on: 08/10/2023 11:18 AM   Modules accepted: Orders

## 2023-08-11 ENCOUNTER — Other Ambulatory Visit: Payer: BC Managed Care – PPO

## 2023-08-11 DIAGNOSIS — R197 Diarrhea, unspecified: Secondary | ICD-10-CM

## 2023-08-11 NOTE — Progress Notes (Signed)
Specimen drop off

## 2023-08-12 ENCOUNTER — Encounter: Payer: Self-pay | Admitting: Physician Assistant

## 2023-08-12 ENCOUNTER — Other Ambulatory Visit: Payer: Self-pay | Admitting: Physician Assistant

## 2023-08-12 DIAGNOSIS — R197 Diarrhea, unspecified: Secondary | ICD-10-CM

## 2023-08-12 MED ORDER — CIPROFLOXACIN HCL 500 MG PO TABS
500.0000 mg | ORAL_TABLET | Freq: Two times a day (BID) | ORAL | 0 refills | Status: AC
Start: 2023-08-12 — End: 2023-08-17

## 2023-08-17 ENCOUNTER — Encounter: Payer: Self-pay | Admitting: Physician Assistant

## 2023-08-17 NOTE — Progress Notes (Unsigned)
Established patient visit   Patient: Cathy Simmons   DOB: 31-Oct-1966   57 y.o. Female  MRN: [id number] Visit Date: 08/18/2023  Today's healthcare provider: Alfredia Ferguson, PA-C   No chief complaint on file.  Subjective    HPI  Pt was seen in office 08/02/23 for vomiting ,diarrhea, abdominal pain and upper respiratory symptoms. She was dx with a URI, given prednisone, zofran, and recommended tylenol for symptoms.  Pt was seen again 08/08/23 for chest pain, lower abdominal pain, diarrhea. She then developed fevers, persistent diarrhea and abdominal pain. Labs were within a normal range, KUB was negative, and stool cultures.   Today,  Medications: Outpatient Medications Prior to Visit  Medication Sig   promethazine-dextromethorphan (PROMETHAZINE-DM) 6.25-15 MG/5ML syrup Take 5 mLs by mouth 4 (four) times daily as needed for cough. (Patient not taking: Reported on 08/08/2023)   albuterol (VENTOLIN HFA) 108 (90 Base) MCG/ACT inhaler Inhale 2 puffs into the lungs every 6 (six) hours as needed for wheezing or shortness of breath.   benzonatate (TESSALON) 100 MG capsule Take 1 capsule (100 mg total) by mouth 3 (three) times daily as needed for cough. (Patient not taking: Reported on 08/08/2023)   cetirizine (ZYRTEC) 10 MG tablet Take 10 mg by mouth daily.   Cholecalciferol (VITAMIN D-3) 1000 units CAPS Take 1,000 Units by mouth daily.  (Patient not taking: Reported on 08/08/2023)   ciprofloxacin (CIPRO) 500 MG tablet Take 1 tablet (500 mg total) by mouth 2 (two) times daily for 5 days.   clonazePAM (KLONOPIN) 0.5 MG tablet Take 1 tablet (0.5 mg total) by mouth 2 (two) times daily as needed. for anxiety   dicyclomine (BENTYL) 10 MG capsule Take 1 tab every 6 hours as needed for abdominal cramping. (Patient not taking: Reported on 08/08/2023)   esomeprazole (NEXIUM) 20 MG capsule Take 20 mg by mouth daily at 12 noon.   famotidine (PEPCID) 20 MG tablet Take 1 tablet (20 mg total) by mouth daily.    fluconazole (DIFLUCAN) 150 MG tablet Take 1 tab, repeat in 72 hours if no improvement.   ondansetron (ZOFRAN-ODT) 4 MG disintegrating tablet Take 1 tablet (4 mg total) by mouth every 8 (eight) hours as needed for nausea or vomiting.   rosuvastatin (CRESTOR) 20 MG tablet Take 1 tablet (20 mg total) by mouth daily.   tobramycin (TOBREX) 0.3 % ophthalmic solution Place 1 drop into both eyes every 4 (four) hours. (Patient not taking: Reported on 08/08/2023)   vitamin B-12 (CYANOCOBALAMIN) 1000 MCG tablet Take 500-1,000 mcg by mouth daily.    XARELTO 20 MG TABS tablet TAKE 1 TABLET BY MOUTH ONCE DAILY WITH SUPPER   No facility-administered medications prior to visit.    Review of Systems {Insert previous labs (optional):23779} {See past labs  Heme  Chem  Endocrine  Serology  Results Review (optional):1}   Objective    There were no vitals taken for this visit. {Insert last BP/Wt (optional):23777}{See vitals history (optional):1}  Physical Exam  ***  No results found for any visits on 08/18/23.  Assessment & Plan     ***  No follow-ups on file.      {provider attestation***:1}   Alfredia Ferguson, PA-C  [facility] at [facility] [phone] (phone) [phone] (fax)  [facility]

## 2023-08-18 ENCOUNTER — Encounter: Payer: Self-pay | Admitting: Physician Assistant

## 2023-08-18 ENCOUNTER — Ambulatory Visit (INDEPENDENT_AMBULATORY_CARE_PROVIDER_SITE_OTHER): Payer: BC Managed Care – PPO | Admitting: Physician Assistant

## 2023-08-18 VITALS — BP 112/76 | HR 77 | Temp 98.6°F | Resp 20 | Wt 140.0 lb

## 2023-08-18 DIAGNOSIS — R197 Diarrhea, unspecified: Secondary | ICD-10-CM

## 2023-08-18 LAB — CDIFF NAA+O+P+STOOL CULTURE
E coli, Shiga toxin Assay: NEGATIVE
Toxigenic C. Difficile by PCR: NEGATIVE

## 2023-08-23 ENCOUNTER — Other Ambulatory Visit: Payer: Self-pay | Admitting: Family Medicine

## 2023-08-24 DIAGNOSIS — T7840XA Allergy, unspecified, initial encounter: Secondary | ICD-10-CM | POA: Diagnosis not present

## 2023-08-24 DIAGNOSIS — Z6826 Body mass index (BMI) 26.0-26.9, adult: Secondary | ICD-10-CM | POA: Diagnosis not present

## 2023-08-24 DIAGNOSIS — E663 Overweight: Secondary | ICD-10-CM | POA: Diagnosis not present

## 2023-08-24 DIAGNOSIS — R030 Elevated blood-pressure reading, without diagnosis of hypertension: Secondary | ICD-10-CM | POA: Diagnosis not present

## 2023-09-12 ENCOUNTER — Ambulatory Visit: Payer: BC Managed Care – PPO | Admitting: Family Medicine

## 2023-09-12 ENCOUNTER — Encounter: Payer: Self-pay | Admitting: Family Medicine

## 2023-09-12 ENCOUNTER — Other Ambulatory Visit: Payer: Self-pay | Admitting: Family Medicine

## 2023-09-12 VITALS — BP 122/80 | HR 84 | Temp 85.0°F | Resp 16 | Ht 62.0 in | Wt 142.2 lb

## 2023-09-12 DIAGNOSIS — R3129 Other microscopic hematuria: Secondary | ICD-10-CM | POA: Diagnosis not present

## 2023-09-12 DIAGNOSIS — J0100 Acute maxillary sinusitis, unspecified: Secondary | ICD-10-CM

## 2023-09-12 LAB — URINALYSIS, MICROSCOPIC ONLY

## 2023-09-12 LAB — POC COVID19 BINAXNOW: SARS Coronavirus 2 Ag: NEGATIVE

## 2023-09-12 MED ORDER — PREDNISONE 20 MG PO TABS
40.0000 mg | ORAL_TABLET | Freq: Every day | ORAL | 0 refills | Status: AC
Start: 2023-09-12 — End: 2023-09-17

## 2023-09-12 MED ORDER — FLUCONAZOLE 150 MG PO TABS
ORAL_TABLET | ORAL | 0 refills | Status: DC
Start: 2023-09-12 — End: 2023-11-22

## 2023-09-12 MED ORDER — DOXYCYCLINE HYCLATE 100 MG PO TABS
100.0000 mg | ORAL_TABLET | Freq: Two times a day (BID) | ORAL | 0 refills | Status: AC
Start: 2023-09-14 — End: 2023-09-21

## 2023-09-12 NOTE — Progress Notes (Signed)
Chief Complaint  Patient presents with   Sinus Problem    Sinus Problem    Cathy Simmons here for URI complaints.  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   Depression    Diverticulitis    Family history of anesthesia complication    mother difficult to wake   GERD (gastroesophageal reflux disease)    History of blood clots    Hyperlipidemia    Osteoporosis     Objective BP 122/80 (BP Location: Left Arm, Patient Position: Sitting, Cuff Size: Normal)   Pulse 84   Temp (!) 85 F (29.4 C) (Oral)   Resp 16   Ht 5\' 2"  (1.575 m)   Wt 142 lb 3.2 oz (64.5 kg)   SpO2 98%   BMI 26.01 kg/m  General: Awake, alert, appears stated age HEENT: AT, [place], ears patent b/l and TM's neg, nares patent w/o discharge, pharynx pink and without exudates, MMM, +ttp over max sinuses b/l Neck: No masses or asymmetry Heart: RRR Lungs: CTAB, no accessory muscle use Psych: Age appropriate judgment and insight, normal mood and affect  Acute maxillary sinusitis, recurrence not specified - Plan: predniSONE (DELTASONE) 20 MG tablet, fluconazole (DIFLUCAN) 150 MG tablet, doxycycline (VIBRA-TABS) 100 MG tablet  Microscopic hematuria - Plan: Urine Microscopic Only  5 d pred burst 40 mg/d. If no improvement in the next 2 d, will take 7 d of doxy. Diflucan prn. Continue to push fluids, practice good hand hygiene, cover mouth when coughing. F/u prn. If starting to experience fevers, shaking, or shortness of breath, seek immediate care. Recheck microscopy.  Pt voiced understanding and agreement to the  plan.  Jilda Roche [place], DO 09/12/23 11:31 AM

## 2023-09-12 NOTE — Addendum Note (Signed)
Addended by: Kathi Ludwig on: 09/12/2023 11:51 AM   Modules accepted: Orders

## 2023-09-12 NOTE — Patient Instructions (Addendum)
Continue to push fluids, practice good hand hygiene, and cover your mouth if you cough.  If you start having fevers, shaking or shortness of breath, seek immediate care.  OK to take Tylenol 1000 mg (2 extra strength tabs) or 975 mg (3 regular strength tabs) every 6 hours as needed.  Start with the prednisone. If no improvement in the next 2 days, use the doxycycline.   Give [place] 2-3 business days to get the results of your labs back.   Let us know if you need anything.

## 2023-09-23 ENCOUNTER — Other Ambulatory Visit: Payer: BC Managed Care – PPO

## 2023-09-23 ENCOUNTER — Encounter: Payer: Self-pay | Admitting: Urology

## 2023-09-23 ENCOUNTER — Ambulatory Visit: Payer: BC Managed Care – PPO | Admitting: Urology

## 2023-09-23 VITALS — BP 104/68 | HR 75 | Ht 62.0 in | Wt 140.0 lb

## 2023-09-23 DIAGNOSIS — N3281 Overactive bladder: Secondary | ICD-10-CM

## 2023-09-23 DIAGNOSIS — N200 Calculus of kidney: Secondary | ICD-10-CM | POA: Insufficient documentation

## 2023-09-23 DIAGNOSIS — R3129 Other microscopic hematuria: Secondary | ICD-10-CM | POA: Diagnosis not present

## 2023-09-23 LAB — URINALYSIS, ROUTINE W REFLEX MICROSCOPIC
Bilirubin, UA: NEGATIVE
Glucose, UA: NEGATIVE
Ketones, UA: NEGATIVE
Leukocytes,UA: NEGATIVE
Nitrite, UA: NEGATIVE
Protein,UA: NEGATIVE
RBC, UA: NEGATIVE
Specific Gravity, UA: 1.015 (ref 1.005–1.030)
Urobilinogen, Ur: 0.2 mg/dL (ref 0.2–1.0)
pH, UA: 7 (ref 5.0–7.5)

## 2023-09-23 LAB — BLADDER SCAN AMB NON-IMAGING

## 2023-09-23 MED ORDER — SOLIFENACIN SUCCINATE 10 MG PO TABS
10.0000 mg | ORAL_TABLET | Freq: Every day | ORAL | 11 refills | Status: DC
Start: 2023-09-23 — End: 2023-10-21

## 2023-09-23 NOTE — Progress Notes (Signed)
Assessment: 1. Microscopic hematuria   2. Nephrolithiasis   3. OAB (overactive bladder)     Plan: I personally reviewed the patient's chart including provider notes, imaging and lab results. Today I had a discussion with the patient regarding the findings of microscopic hematuria including the implications and differential diagnoses associated with it.  I also discussed recommendations for further evaluation including the rationale for upper tract imaging and cystoscopy.  I discussed the nature of these procedures including potential risk and complications.  The patient expressed an understanding of these issues. She has previously had a hematuria evaluation in August 2021 which was remarkable only for the left renal calculus. Schedule for CT hematuria protocol  CxBladder detect sent today  Diagnosis and management of overactive bladder/urge incontinence discussed with the patient today.  Options for management including avoidance of dietary irritants, behavioral modification, medical therapy, neuromodulation, and chemodenervation discussed.   Bladder diet sheet given. Trial of solifenacin 10 mg daily.  Rx sent.  Use and side effects discussed.  Return to office in 1 month  Chief Complaint:  Chief Complaint  Patient presents with   Hematuria    History of Present Illness:  Cathy Simmons is a 57 y.o. female who is seen in consultation from [place], DO for evaluation of microscopic hematuria. U/A results:   4/21 3-6 RBC 5/21 7-10 RBC 6/21 7-10 RBC 8/22 0-5 RBC 11/22 6-10 RBC 9/24 3-6 RBC 10/24 11-20 RBC  CT imaging from 11/22 showed nonobstructive 5 mm left renal calculus. KUB from 08/10/2023 showed a cluster of calcifications in the upper pole of the left kidney, no obvious calcifications along the expected course of either ureter. No history of gross hematuria.  No dysuria or flank pain. She has previously had a hematuria evaluation by Dr. Benancio Deeds at  [facility] in August 2021.  CT abdomen and pelvis from 07/01/2020 showed a 7 mm left renal calculus.  Cystoscopy was normal. She has a prior history of tobacco use.  She has lower urinary tract symptoms including frequency, voiding every hour, urgency, some discomfort after voiding, nocturia 2-4 times, and urge incontinence.  The symptoms have been present for some time and have worsened over the past several months.  No prior therapy for her symptoms.   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   Depression    Diverticulitis    Family history of anesthesia complication    mother difficult to wake   GERD (gastroesophageal reflux disease)    History of blood clots    Hyperlipidemia    Osteoporosis     Past Surgical History:  Past Surgical History:  Procedure Laterality Date   BREAST BIOPSY Right    needle core biopsy, benign   BREAST EXCISIONAL BIOPSY Left    2 times left breast, benign both times   BREAST SURGERY  breat biopsy x2 last 1995   DILATION AND CURETTAGE OF UTERUS  x2 1984 and 1991   excision left ganglion cyst Left    FOREIGN BODY REMOVAL Right 01/29/2013   Procedure: EXCISION FOREIGN BODY RIGHT SMALL FINGER;  Surgeon: Nicki Reaper, MD;  Location: [facility];  Service: Orthopedics;  Laterality: Right;   removal spleen  2000    Allergies:  Allergies  Allergen Reactions   Amoxicillin     Other Reaction(s):  Not available   Bactrim [Sulfamethoxazole-Trimethoprim] Other (See Comments)    Sulfa causes yeast infections!   Duloxetine     Other Reaction(s): Other (See Comments)  Passed out   Adhesive [Tape] Rash and Other (See Comments)    Band-Aids   Cymbalta [Duloxetine Hcl] Anxiety    Family History:  Family History  Problem Relation Age of Onset   Stroke Mother    Cancer Mother        breast cancer   Breast cancer Mother     Diabetes Father    Parkinson's disease Father    Colon cancer Neg Hx    Rectal cancer Neg Hx    Esophageal cancer Neg Hx    Stomach cancer Neg Hx     Social History:  Social History   Tobacco Use   Smoking status: Former    Current packs/day: 0.00    Types: Cigarettes    Quit date: 01/30/1999    Years since quitting: 24.6   Smokeless tobacco: Never  Vaping Use   Vaping status: Never Used  Substance Use Topics   Alcohol use: No   Drug use: No    Review of symptoms:  Constitutional:  Negative for unexplained weight loss, night sweats, fever, chills ENT:  Negative for nose bleeds, sinus pain, painful swallowing CV:  Negative for chest pain, shortness of breath, exercise intolerance, palpitations, loss of consciousness Resp:  Negative for cough, wheezing, shortness of breath GI:  Negative for nausea, vomiting, diarrhea, bloody stools GU:  Positives noted in HPI; otherwise negative for gross hematuria, dysuria Neuro:  Negative for seizures, poor balance, limb weakness, slurred speech Psych:  Negative for lack of energy, depression, anxiety Endocrine:  Negative for polydipsia, polyuria, symptoms of hypoglycemia (dizziness, hunger, sweating) Hematologic:  Negative for anemia, purpura, petechia, prolonged or excessive bleeding, use of anticoagulants  Allergic:  Negative for difficulty breathing or choking as a result of exposure to anything; no shellfish allergy; no allergic response (rash/itch) to materials, foods  Physical exam: BP 104/68   Pulse 75   Ht 5\' 2"  (1.575 m)   Wt 140 lb (63.5 kg)   BMI 25.61 kg/m  GENERAL APPEARANCE:  Well appearing, well developed, well nourished, NAD HEENT: Atraumatic, Normocephalic, oropharynx clear. NECK: Supple without lymphadenopathy or thyromegaly. LUNGS: Clear to auscultation bilaterally. HEART: Regular Rate and Rhythm without murmurs, gallops, or rubs. ABDOMEN: Soft, non-tender, No Masses. EXTREMITIES: Moves all extremities well.   Without clubbing, cyanosis, or edema. NEUROLOGIC:  Alert and oriented x 3, normal gait, CN II-XII grossly intact.  MENTAL STATUS:  Appropriate. BACK:  Non-tender to palpation.  No CVAT SKIN:  Warm, dry and intact.    Results: U/A:  negative  PVR = 20 ml

## 2023-09-30 ENCOUNTER — Ambulatory Visit (HOSPITAL_BASED_OUTPATIENT_CLINIC_OR_DEPARTMENT_OTHER): Payer: BC Managed Care – PPO

## 2023-09-30 ENCOUNTER — Ambulatory Visit (HOSPITAL_BASED_OUTPATIENT_CLINIC_OR_DEPARTMENT_OTHER)
Admission: RE | Admit: 2023-09-30 | Discharge: 2023-09-30 | Disposition: A | Discharge status: Home / Self Care | Payer: BC Managed Care – PPO | Source: Ambulatory Visit | Attending: Family Medicine | Admitting: Family Medicine

## 2023-09-30 ENCOUNTER — Encounter (HOSPITAL_BASED_OUTPATIENT_CLINIC_OR_DEPARTMENT_OTHER): Payer: Self-pay | Admitting: *Deleted

## 2023-09-30 DIAGNOSIS — N200 Calculus of kidney: Secondary | ICD-10-CM | POA: Diagnosis not present

## 2023-09-30 DIAGNOSIS — K5730 Diverticulosis of large intestine without perforation or abscess without bleeding: Secondary | ICD-10-CM | POA: Diagnosis not present

## 2023-09-30 DIAGNOSIS — R3129 Other microscopic hematuria: Secondary | ICD-10-CM | POA: Insufficient documentation

## 2023-09-30 DIAGNOSIS — N83201 Unspecified ovarian cyst, right side: Secondary | ICD-10-CM | POA: Diagnosis not present

## 2023-09-30 MED ORDER — IOHEXOL 300 MG/ML  SOLN
125.0000 mL | Freq: Once | INTRAMUSCULAR | Status: AC | PRN
  Administered 2023-09-30: 125 mL via INTRAVENOUS

## 2023-10-04 ENCOUNTER — Encounter: Payer: Self-pay | Admitting: Urology

## 2023-10-07 DIAGNOSIS — J039 Acute tonsillitis, unspecified: Secondary | ICD-10-CM | POA: Diagnosis not present

## 2023-10-07 DIAGNOSIS — E663 Overweight: Secondary | ICD-10-CM | POA: Diagnosis not present

## 2023-10-07 DIAGNOSIS — B349 Viral infection, unspecified: Secondary | ICD-10-CM | POA: Diagnosis not present

## 2023-10-07 DIAGNOSIS — Z6826 Body mass index (BMI) 26.0-26.9, adult: Secondary | ICD-10-CM | POA: Diagnosis not present

## 2023-10-08 DIAGNOSIS — Z87891 Personal history of nicotine dependence: Secondary | ICD-10-CM | POA: Diagnosis not present

## 2023-10-08 DIAGNOSIS — J029 Acute pharyngitis, unspecified: Secondary | ICD-10-CM | POA: Diagnosis not present

## 2023-10-08 DIAGNOSIS — Z882 Allergy status to sulfonamides status: Secondary | ICD-10-CM | POA: Diagnosis not present

## 2023-10-08 DIAGNOSIS — R0981 Nasal congestion: Secondary | ICD-10-CM | POA: Diagnosis not present

## 2023-10-08 DIAGNOSIS — Z888 Allergy status to other drugs, medicaments and biological substances status: Secondary | ICD-10-CM | POA: Diagnosis not present

## 2023-10-08 DIAGNOSIS — Z720 Tobacco use: Secondary | ICD-10-CM | POA: Diagnosis not present

## 2023-10-21 ENCOUNTER — Encounter: Payer: Self-pay | Admitting: Urology

## 2023-10-21 ENCOUNTER — Ambulatory Visit: Payer: BC Managed Care – PPO | Admitting: Urology

## 2023-10-21 VITALS — BP 119/73 | HR 87 | Ht 62.0 in | Wt 140.0 lb

## 2023-10-21 DIAGNOSIS — R399 Unspecified symptoms and signs involving the genitourinary system: Secondary | ICD-10-CM | POA: Diagnosis not present

## 2023-10-21 DIAGNOSIS — N200 Calculus of kidney: Secondary | ICD-10-CM | POA: Diagnosis not present

## 2023-10-21 DIAGNOSIS — N3281 Overactive bladder: Secondary | ICD-10-CM

## 2023-10-21 DIAGNOSIS — R3129 Other microscopic hematuria: Secondary | ICD-10-CM | POA: Diagnosis not present

## 2023-10-21 DIAGNOSIS — R39198 Other difficulties with micturition: Secondary | ICD-10-CM

## 2023-10-21 DIAGNOSIS — R3915 Urgency of urination: Secondary | ICD-10-CM

## 2023-10-21 DIAGNOSIS — R350 Frequency of micturition: Secondary | ICD-10-CM | POA: Diagnosis not present

## 2023-10-21 LAB — URINALYSIS, ROUTINE W REFLEX MICROSCOPIC
Bilirubin, UA: NEGATIVE
Glucose, UA: NEGATIVE
Ketones, UA: NEGATIVE
Leukocytes,UA: NEGATIVE
Nitrite, UA: NEGATIVE
Protein,UA: NEGATIVE
Specific Gravity, UA: >1.03 — ABNORMAL HIGH (ref 1.005–1.030)
Urobilinogen, Ur: 0.2 mg/dL (ref 0.2–1.0)
pH, UA: 7 (ref 5.0–7.5)

## 2023-10-21 LAB — MICROSCOPIC EXAMINATION

## 2023-10-21 MED ORDER — MIRABEGRON ER 50 MG PO TB24: 50.0000 mg | ORAL_TABLET | Freq: Every day | ORAL | 11 refills | Status: DC

## 2023-10-21 NOTE — Progress Notes (Signed)
Assessment: 1. Microscopic hematuria   2. Nephrolithiasis   3. OAB (overactive bladder)    Plan: No evidence of serious or life-threatening causes for her microscopic hematuria. Recommend continued observation at this time. Recommend continued observation for the stable left renal calculus. Her left-sided back pain appears to be musculoskeletal in nature. Continue bladder diet  Trial of Myrbetriq 50 mg daily.  Samples and rx provided. Return to office in 6 weeks  Chief Complaint:  Chief Complaint  Patient presents with   Hematuria    History of Present Illness:  Cathy Simmons is a 57 y.o. female who is seen for further evaluation of microscopic hematuria. U/A results:   4/21 3-6 RBC 5/21 7-10 RBC 6/21 7-10 RBC 8/22 0-5 RBC 11/22 6-10 RBC 9/24 3-6 RBC 10/24 11-20 RBC  CT imaging from 11/22 showed nonobstructive 5 mm left renal calculus. KUB from 08/10/2023 showed a cluster of calcifications in the upper pole of the left kidney, no obvious calcifications along the expected course of either ureter. No history of gross hematuria.  No dysuria or flank pain. She has previously had a hematuria evaluation by Dr. Benancio Deeds at [facility] in August 2021.  CT abdomen and pelvis from 07/01/2020 showed a 7 mm left renal calculus.  Cystoscopy was normal. She has a prior history of tobacco use. CT hematuria profile from 09/30/2023 showed a 9 mm nonobstructing left lower pole renal calculus, no renal masses or obstruction, no filling defects.  She was also noted to have a 3.2 right ovarian cyst. CX bladder was normal.  She has lower urinary tract symptoms including frequency, voiding every hour, urgency, some discomfort after voiding, nocturia 2-4 times, and urge incontinence.  The symptoms have been present for some time and have worsened over the past several months.  No prior therapy for her symptoms. She was given a trial of Solifenacin in 10/24.  She returns today for further  evaluation of her microscopic hematuria and overactive bladder symptoms.  She has not had any gross hematuria.  She is complaining of some left sided back pain with radiation around to the left lower quadrant and into her left thigh.  She only took the Vesicare for approximately 2 weeks.  She discontinued this due to some dry mouth as well as problems with strep throat making swallowing difficult.  She did not see any significant improvement in her symptoms with the medication.  She continues with frequency, urgency, and some discomfort with voiding.  She has noted some increased discomfort within the past few days.  Portions of the above documentation were copied from a prior visit for review purposes only.    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   Depression    Diverticulitis    Family history of anesthesia complication    mother difficult to wake   GERD (gastroesophageal reflux disease)    History of blood clots    Hyperlipidemia    Osteoporosis     Past Surgical History:  Past Surgical History:  Procedure Laterality Date   BREAST BIOPSY Right    needle core biopsy, benign   BREAST EXCISIONAL BIOPSY Left    2 times left breast, benign both times   BREAST SURGERY  breat biopsy x2 last 1995   DILATION AND CURETTAGE OF UTERUS  x2 1984 and 1991  excision left ganglion cyst Left    FOREIGN BODY REMOVAL Right 01/29/2013   Procedure: EXCISION FOREIGN BODY RIGHT SMALL FINGER;  Surgeon: Nicki Reaper, MD;  Location: [facility];  Service: Orthopedics;  Laterality: Right;   removal spleen  2000    Allergies:  Allergies  Allergen Reactions   Amoxicillin     Other Reaction(s): Not available   Bactrim [Sulfamethoxazole-Trimethoprim] Other (See Comments)    Sulfa causes yeast infections!   Duloxetine     Other Reaction(s): Other (See Comments)  Passed out    Adhesive [Tape] Rash and Other (See Comments)    Band-Aids   Cymbalta [Duloxetine Hcl] Anxiety    Family History:  Family History  Problem Relation Age of Onset   Stroke Mother    Cancer Mother        breast cancer   Breast cancer Mother    Diabetes Father    Parkinson's disease Father    Colon cancer Neg Hx    Rectal cancer Neg Hx    Esophageal cancer Neg Hx    Stomach cancer Neg Hx     Social History:  Social History   Tobacco Use   Smoking status: Former    Current packs/day: 0.00    Types: Cigarettes    Quit date: 01/30/1999    Years since quitting: 24.7   Smokeless tobacco: Never  Vaping Use   Vaping status: Never Used  Substance Use Topics   Alcohol use: No   Drug use: No    ROS: Constitutional:  Negative for fever, chills, weight loss CV: Negative for chest pain, previous MI, hypertension Respiratory:  Negative for shortness of breath, wheezing, sleep apnea, frequent cough GI:  Negative for nausea, vomiting, bloody stool, GERD  Physical exam: BP 119/73   Pulse 87   Ht 5\' 2"  (1.575 m)   Wt 140 lb (63.5 kg)   BMI 25.61 kg/m  GENERAL APPEARANCE:  Well appearing, well developed, well nourished, NAD HEENT:  Atraumatic, normocephalic, oropharynx clear NECK:  Supple without lymphadenopathy or thyromegaly ABDOMEN:  Soft, non-tender, no masses EXTREMITIES:  Moves all extremities well, without clubbing, cyanosis, or edema NEUROLOGIC:  Alert and oriented x 3, normal gait, CN II-XII grossly intact MENTAL STATUS:  appropriate BACK: Tender to palpation in the left paraspinal area, No CVAT SKIN:  Warm, dry, and intact  Results: U/A:  3-10 RBC

## 2023-10-23 DIAGNOSIS — M5442 Lumbago with sciatica, left side: Secondary | ICD-10-CM | POA: Diagnosis not present

## 2023-10-23 DIAGNOSIS — M545 Low back pain, unspecified: Secondary | ICD-10-CM | POA: Diagnosis not present

## 2023-10-25 ENCOUNTER — Telehealth: Payer: Self-pay | Admitting: *Deleted

## 2023-10-25 NOTE — Transitions of Care (Post Inpatient/ED Visit) (Signed)
   10/25/2023  Name: Cathy Simmons MRN: [id number] DOB: [birth year]  Today's TOC FU Call Status: Today's TOC FU Call Status:: Unsuccessful Call (1st Attempt) Unsuccessful Call (1st Attempt) Date: 10/25/23  Attempted to reach the patient regarding the most recent Inpatient/ED visit.  Follow Up Plan: Additional outreach attempts will be made to reach the patient to complete the Transitions of Care (Post Inpatient/ED visit) call.   Signature Jameyah Fennewald, [facility]

## 2023-10-26 ENCOUNTER — Telehealth: Payer: Self-pay | Admitting: *Deleted

## 2023-10-26 NOTE — Transitions of Care (Post Inpatient/ED Visit) (Signed)
   10/26/2023  Name: ARMONII MARKEL MRN: [id number] DOB: 06/04/1966  Today's TOC FU Call Status: Today's TOC FU Call Status:: Unsuccessful Call (2nd Attempt) Unsuccessful Call (2nd Attempt) Date: 10/26/23  Attempted to reach the patient regarding the most recent Inpatient/ED visit.  Follow Up Plan: No further outreach attempts will be made at this time. We have been unable to contact the patient.  Signature Briseidy Spark, [facility]

## 2023-11-18 DIAGNOSIS — R0981 Nasal congestion: Secondary | ICD-10-CM | POA: Diagnosis not present

## 2023-11-18 DIAGNOSIS — B349 Viral infection, unspecified: Secondary | ICD-10-CM | POA: Diagnosis not present

## 2023-11-18 DIAGNOSIS — R051 Acute cough: Secondary | ICD-10-CM | POA: Diagnosis not present

## 2023-11-18 DIAGNOSIS — J029 Acute pharyngitis, unspecified: Secondary | ICD-10-CM | POA: Diagnosis not present

## 2023-11-19 ENCOUNTER — Other Ambulatory Visit: Payer: Self-pay | Admitting: Family Medicine

## 2023-11-21 ENCOUNTER — Other Ambulatory Visit (HOSPITAL_COMMUNITY): Payer: Self-pay | Admitting: Family Medicine

## 2023-11-21 DIAGNOSIS — Z1231 Encounter for screening mammogram for malignant neoplasm of breast: Secondary | ICD-10-CM

## 2023-11-22 ENCOUNTER — Ambulatory Visit: Payer: BC Managed Care – PPO | Admitting: Family Medicine

## 2023-11-22 ENCOUNTER — Encounter: Payer: Self-pay | Admitting: Family Medicine

## 2023-11-22 VITALS — BP 124/76 | HR 85 | Temp 98.0°F | Resp 16 | Ht 62.0 in | Wt 139.0 lb

## 2023-11-22 DIAGNOSIS — J0100 Acute maxillary sinusitis, unspecified: Secondary | ICD-10-CM | POA: Diagnosis not present

## 2023-11-22 LAB — POCT INFLUENZA A/B
Influenza A, POC: NEGATIVE
Influenza B, POC: NEGATIVE

## 2023-11-22 LAB — POC COVID19 BINAXNOW: SARS Coronavirus 2 Ag: NEGATIVE

## 2023-11-22 MED ORDER — PREDNISONE 20 MG PO TABS: 40.0000 mg | ORAL_TABLET | Freq: Every day | ORAL | 0 refills | Status: AC

## 2023-11-22 MED ORDER — FLUCONAZOLE 150 MG PO TABS: ORAL_TABLET | ORAL | 0 refills | Status: DC

## 2023-11-22 MED ORDER — PROMETHAZINE-DM 6.25-15 MG/5ML PO SYRP: 5.0000 mL | ORAL_SOLUTION | Freq: Four times a day (QID) | ORAL | 0 refills | Status: DC | PRN

## 2023-11-22 MED ORDER — DOXYCYCLINE HYCLATE 100 MG PO TABS: 100.0000 mg | ORAL_TABLET | Freq: Two times a day (BID) | ORAL | 0 refills | Status: DC

## 2023-11-22 NOTE — Progress Notes (Signed)
Chief Complaint  Patient presents with   Nasal Congestion    Congestion and fatigue     Cathy Simmons here for URI complaints.  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   Depression    Diverticulitis    Family history of anesthesia complication    mother difficult to wake   GERD (gastroesophageal reflux disease)    History of blood clots    Hyperlipidemia    Osteoporosis     Objective BP 124/76   Pulse 85   Temp 98 F (36.7 C) (Oral)   Resp 16   Ht 5\' 2"  (1.575 m)   Wt (!) 1339 lb 6.4 oz (607.5 kg)   SpO2 98%   BMI 244.98 kg/m  General: Awake, alert, appears stated age HEENT: AT, [place], ears patent b/l and TM's retracted b/l, nares patent w clear discharge, pharynx pink and without exudates, MMM, +ttp over the L max sinus Neck: No masses or asymmetry Heart: RRR Lungs: CTAB, no accessory muscle use Psych: Age appropriate judgment and insight, normal mood and affect  Acute maxillary sinusitis, recurrence not specified - Plan: predniSONE (DELTASONE) 20 MG tablet, doxycycline (VIBRA-TABS) 100 MG tablet, fluconazole (DIFLUCAN) 150 MG tablet, promethazine-dextromethorphan (PROMETHAZINE-DM) 6.25-15 MG/5ML syrup  Given fever and subsequent worsening, we will start her with 40 mg of prednisone for 5 days.  If no improvement in next day or 2, she will take doxycycline for 7 days.  Fluconazole if she needs it.  Cough syrup as needed.  Warned about drowsiness.  She has tolerated this well in the past.  Flu/COVID-negative.  Continue to push  fluids, practice good hand hygiene, cover mouth when coughing. F/u prn. If starting to experience fevers, shaking, or shortness of breath, seek immediate care. Pt voiced understanding and agreement to the plan.  Jilda Roche [place], DO 11/22/23 11:26 AM

## 2023-11-22 NOTE — Patient Instructions (Addendum)
Continue to push fluids, practice good hand hygiene, and cover your mouth if you cough.  If you start having fevers, shaking or shortness of breath, seek immediate care.  OK to take Tylenol 1000 mg (2 extra strength tabs) or 975 mg (3 regular strength tabs) every 6 hours as needed.  Wait 2 days prior to taking the antibiotic (doxycycline). If no better by then, OK to take it.   If you test positive for covid or flu, we will cancel the doxycycline.   Let us know if you need anything.

## 2023-12-02 ENCOUNTER — Ambulatory Visit: Payer: BC Managed Care – PPO | Admitting: Urology

## 2023-12-16 ENCOUNTER — Ambulatory Visit: Payer: Self-pay | Admitting: Family Medicine

## 2023-12-16 ENCOUNTER — Ambulatory Visit: Payer: BC Managed Care – PPO | Admitting: Family Medicine

## 2023-12-16 ENCOUNTER — Encounter: Payer: Self-pay | Admitting: Family Medicine

## 2023-12-16 ENCOUNTER — Other Ambulatory Visit: Payer: Self-pay | Admitting: Emergency Medicine

## 2023-12-16 VITALS — BP 118/76 | HR 96 | Temp 98.0°F | Resp 16 | Ht 62.0 in | Wt 143.2 lb

## 2023-12-16 DIAGNOSIS — R059 Cough, unspecified: Secondary | ICD-10-CM | POA: Diagnosis not present

## 2023-12-16 DIAGNOSIS — J101 Influenza due to other identified influenza virus with other respiratory manifestations: Secondary | ICD-10-CM

## 2023-12-16 LAB — POCT INFLUENZA A/B
Influenza A, POC: POSITIVE — AB
Influenza B, POC: NEGATIVE

## 2023-12-16 LAB — POC COVID19 BINAXNOW: SARS Coronavirus 2 Ag: NEGATIVE

## 2023-12-16 MED ORDER — ONDANSETRON 4 MG PO TBDP: 4.0000 mg | ORAL_TABLET | Freq: Three times a day (TID) | ORAL | 0 refills | Status: DC | PRN

## 2023-12-16 MED ORDER — OSELTAMIVIR PHOSPHATE 75 MG PO CAPS: 75.0000 mg | ORAL_CAPSULE | Freq: Two times a day (BID) | ORAL | 0 refills | Status: AC

## 2023-12-16 MED ORDER — METHYLPREDNISOLONE ACETATE 80 MG/ML IJ SUSP: 80.0000 mg | Freq: Once | INTRAMUSCULAR | Status: DC

## 2023-12-16 MED ORDER — BENZONATATE 200 MG PO CAPS: 200.0000 mg | ORAL_CAPSULE | Freq: Two times a day (BID) | ORAL | 0 refills | Status: DC | PRN

## 2023-12-16 NOTE — Addendum Note (Signed)
Addended by: Kathi Ludwig on: 12/16/2023 03:38 PM   Modules accepted: Orders

## 2023-12-16 NOTE — Progress Notes (Signed)
Chief Complaint  Patient presents with   Nasal Congestion    Nasal Congetsion    Cathy Simmons here for URI complaints.  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   Depression    Diverticulitis    Family history of anesthesia complication    mother difficult to wake   GERD (gastroesophageal reflux disease)    History of blood clots    Hyperlipidemia    Osteoporosis     Objective BP 118/76   Pulse 96   Temp 98 F (36.7 C) (Oral)   Resp 16   Ht 5\' 2"  (1.575 m)   Wt 143 lb 3.2 oz (65 kg)   SpO2 98%   BMI 26.19 kg/m  General: Awake, alert, appears stated age HEENT: AT, [place], ears patent b/l and TM's neg, nares patent w clear discharge, pharynx pink and without exudates, MMM, mild ttp over L max sinus Neck: No masses or asymmetry Heart: RRR Lungs: CTAB, no accessory muscle use Psych: Age appropriate judgment and insight, normal mood and affect  Influenza A - Plan: oseltamivir (TAMIFLU) 75 MG capsule, benzonatate (TESSALON) 200 MG capsule, ondansetron (ZOFRAN-ODT) 4 MG disintegrating tablet  + for Flu A. Tamiflu, Zofran, Tessalon Perles prn. Continue to push fluids, practice good hand hygiene, cover mouth when coughing. F/u prn. If starting to experience fevers, shaking, or shortness of breath, seek immediate care. Pt voiced understanding and agreement to the plan.  Jilda Roche [place], DO 12/16/23 2:19 PM

## 2023-12-16 NOTE — Telephone Encounter (Signed)
Copied from CRM [id number]. Topic: Clinical - Pink Word Triage >> Dec 16, 2023  8:31 AM Cordelia Pen B wrote: Reason for Triage: COUGHING BODY ACHES AND FEVER HEAD/SINUS CONGESTION AND BODY CHILLS  Chief Complaint: cold like symptoms Symptoms: fever, cough, body aches, sinus congestion and chills, vomiting, ear ache Frequency: constant Pertinent Negatives: Patient denies cp, sore throat Disposition: [] ED /[] Urgent Care (no appt availability in office) / [x] Appointment(In office/virtual)/ []  [place] [facility]/ [] Home Care/ [] Refused Recommended Disposition /[] [place] [facility]/ []  Follow-up with PCP Additional Notes: c/o cold like symptoms.  Patient 's voice on call very nasal.  Apt made for this afternoon.  Care advice given, denies questions.  Instructed to go to ER if becomes worse.   Reason for Disposition  Fever present > 3 days (72 hours)  Answer Assessment - Initial Assessment Questions 1. ONSET: "When did the nasal discharge start?"      Started Wednesday evening 2. AMOUNT: "How much discharge is there?"      Clear and a lot 3. COUGH: "Do you have a cough?" If Yes, ask: "Describe the color of your sputum" (clear, white, yellow, green)     yes 4. RESPIRATORY DISTRESS: "Describe your breathing."      yes 5. FEVER: "Do you have a fever?" If Yes, ask: "What is your temperature, how was it measured, and when did it start?"     101.5 6. SEVERITY: "Overall, how bad are you feeling right now?" (e.g., doesn't interfere with normal activities, staying home from school/work, staying in bed)      Staying home 7. OTHER SYMPTOMS: "Do you have any other symptoms?" (e.g., sore throat, earache, wheezing, vomiting)     Vomiting, earache on left.  Protocols used: Common Cold-A-AH

## 2023-12-16 NOTE — Patient Instructions (Signed)
Continue to push fluids, practice good hand hygiene, and cover your mouth if you cough. ? ?If you start having fevers, shaking or shortness of breath, seek immediate care. ? ?OK to take Tylenol 1000 mg (2 extra strength tabs) or 975 mg (3 regular strength tabs) every 6 hours as needed. ? ?Let us know if you need anything. ?

## 2023-12-19 ENCOUNTER — Ambulatory Visit (HOSPITAL_COMMUNITY)
Admission: RE | Admit: 2023-12-19 | Discharge: 2023-12-19 | Disposition: A | Discharge status: Home / Self Care | Payer: BC Managed Care – PPO | Source: Ambulatory Visit | Attending: Family Medicine | Admitting: Family Medicine

## 2023-12-19 ENCOUNTER — Encounter (HOSPITAL_COMMUNITY): Payer: Self-pay

## 2023-12-19 DIAGNOSIS — Z1231 Encounter for screening mammogram for malignant neoplasm of breast: Secondary | ICD-10-CM | POA: Diagnosis not present

## 2024-02-10 ENCOUNTER — Other Ambulatory Visit: Payer: Self-pay | Admitting: Family Medicine

## 2024-02-10 DIAGNOSIS — E7800 Pure hypercholesterolemia, unspecified: Secondary | ICD-10-CM

## 2024-02-14 ENCOUNTER — Other Ambulatory Visit: Payer: Self-pay | Admitting: Family Medicine

## 2024-03-12 ENCOUNTER — Encounter: Payer: Self-pay | Admitting: Family Medicine

## 2024-03-13 DIAGNOSIS — Z6826 Body mass index (BMI) 26.0-26.9, adult: Secondary | ICD-10-CM | POA: Diagnosis not present

## 2024-03-13 DIAGNOSIS — L02419 Cutaneous abscess of limb, unspecified: Secondary | ICD-10-CM | POA: Diagnosis not present

## 2024-03-13 DIAGNOSIS — W57XXXA Bitten or stung by nonvenomous insect and other nonvenomous arthropods, initial encounter: Secondary | ICD-10-CM | POA: Diagnosis not present

## 2024-03-13 DIAGNOSIS — E663 Overweight: Secondary | ICD-10-CM | POA: Diagnosis not present

## 2024-04-09 ENCOUNTER — Emergency Department (HOSPITAL_COMMUNITY)

## 2024-04-09 ENCOUNTER — Emergency Department (HOSPITAL_COMMUNITY)
Admission: EM | Admit: 2024-04-09 | Discharge: 2024-04-09 | Disposition: A | Discharge status: Home / Self Care | Attending: Student | Admitting: Student

## 2024-04-09 ENCOUNTER — Other Ambulatory Visit: Payer: Self-pay

## 2024-04-09 DIAGNOSIS — K8689 Other specified diseases of pancreas: Secondary | ICD-10-CM | POA: Diagnosis not present

## 2024-04-09 DIAGNOSIS — N200 Calculus of kidney: Secondary | ICD-10-CM | POA: Diagnosis not present

## 2024-04-09 DIAGNOSIS — D72829 Elevated white blood cell count, unspecified: Secondary | ICD-10-CM | POA: Diagnosis not present

## 2024-04-09 DIAGNOSIS — R112 Nausea with vomiting, unspecified: Secondary | ICD-10-CM | POA: Diagnosis not present

## 2024-04-09 DIAGNOSIS — R1084 Generalized abdominal pain: Secondary | ICD-10-CM | POA: Diagnosis not present

## 2024-04-09 DIAGNOSIS — N83201 Unspecified ovarian cyst, right side: Secondary | ICD-10-CM | POA: Insufficient documentation

## 2024-04-09 DIAGNOSIS — Z7901 Long term (current) use of anticoagulants: Secondary | ICD-10-CM | POA: Diagnosis not present

## 2024-04-09 DIAGNOSIS — R109 Unspecified abdominal pain: Secondary | ICD-10-CM | POA: Diagnosis not present

## 2024-04-09 LAB — URINALYSIS, W/ REFLEX TO CULTURE (INFECTION SUSPECTED)
Bacteria, UA: NONE SEEN
Bilirubin Urine: NEGATIVE
Glucose, UA: NEGATIVE mg/dL
Ketones, ur: NEGATIVE mg/dL
Leukocytes,Ua: NEGATIVE
Nitrite: NEGATIVE
Protein, ur: NEGATIVE mg/dL
Specific Gravity, Urine: 1.018 (ref 1.005–1.030)
pH: 5 (ref 5.0–8.0)

## 2024-04-09 LAB — COMPREHENSIVE METABOLIC PANEL WITH GFR
ALT: 18 U/L (ref 0–44)
AST: 24 U/L (ref 15–41)
Albumin: 4.2 g/dL (ref 3.5–5.0)
Alkaline Phosphatase: 83 U/L (ref 38–126)
Anion gap: 9 (ref 5–15)
BUN: 16 mg/dL (ref 6–20)
CO2: 25 mmol/L (ref 22–32)
Calcium: 9.4 mg/dL (ref 8.9–10.3)
Chloride: 106 mmol/L (ref 98–111)
Creatinine, Ser: 0.32 mg/dL — ABNORMAL LOW (ref 0.44–1.00)
GFR, Estimated: >60 mL/min (ref >60)
Glucose, Bld: 102 mg/dL — ABNORMAL HIGH (ref 70–99)
Potassium: 3.9 mmol/L (ref 3.5–5.1)
Sodium: 140 mmol/L (ref 135–145)
Total Bilirubin: 0.8 mg/dL (ref 0.0–1.2)
Total Protein: 7.6 g/dL (ref 6.5–8.1)

## 2024-04-09 LAB — CBC WITH DIFFERENTIAL/PLATELET
Abs Immature Granulocytes: 0.06 10*3/uL (ref 0.00–0.07)
Basophils Absolute: 0 10*3/uL (ref 0.0–0.1)
Basophils Relative: 0 %
Eosinophils Absolute: 0.2 10*3/uL (ref 0.0–0.5)
Eosinophils Relative: 2 %
HCT: 44.1 % (ref 36.0–46.0)
Hemoglobin: 14.5 g/dL (ref 12.0–15.0)
Immature Granulocytes: 0 %
Lymphocytes Relative: 12 %
Lymphs Abs: 1.8 10*3/uL (ref 0.7–4.0)
MCH: 31.6 pg (ref 26.0–34.0)
MCHC: 32.9 g/dL (ref 30.0–36.0)
MCV: 96.1 fL (ref 80.0–100.0)
Monocytes Absolute: 0.8 10*3/uL (ref 0.1–1.0)
Monocytes Relative: 6 %
Neutro Abs: 11.3 10*3/uL — ABNORMAL HIGH (ref 1.7–7.7)
Neutrophils Relative %: 80 %
Platelets: 298 10*3/uL (ref 150–400)
RBC: 4.59 MIL/uL (ref 3.87–5.11)
RDW: 13.6 % (ref 11.5–15.5)
WBC: 14.2 10*3/uL — ABNORMAL HIGH (ref 4.0–10.5)
nRBC: 0 % (ref 0.0–0.2)

## 2024-04-09 LAB — LIPASE, BLOOD: Lipase: 25 U/L (ref 11–51)

## 2024-04-09 MED ORDER — ONDANSETRON HCL 4 MG/2ML IJ SOLN
4.0000 mg | Freq: Once | INTRAMUSCULAR | Status: AC
  Administered 2024-04-09: 4 mg via INTRAVENOUS
  Filled 2024-04-09: qty 2

## 2024-04-09 MED ORDER — DICYCLOMINE HCL 20 MG PO TABS: 20.0000 mg | ORAL_TABLET | Freq: Two times a day (BID) | ORAL | 0 refills | Status: DC

## 2024-04-09 MED ORDER — ONDANSETRON 4 MG PO TBDP: 4.0000 mg | ORAL_TABLET | Freq: Three times a day (TID) | ORAL | 0 refills | Status: DC | PRN

## 2024-04-09 MED ORDER — MORPHINE SULFATE (PF) 4 MG/ML IV SOLN
4.0000 mg | Freq: Once | INTRAVENOUS | Status: AC
  Administered 2024-04-09: 4 mg via INTRAVENOUS
  Filled 2024-04-09: qty 1

## 2024-04-09 MED ORDER — IOHEXOL 300 MG/ML  SOLN
100.0000 mL | Freq: Once | INTRAMUSCULAR | Status: AC | PRN
  Administered 2024-04-09: 100 mL via INTRAVENOUS

## 2024-04-09 MED ORDER — SODIUM CHLORIDE 0.9 % IV BOLUS
1000.0000 mL | Freq: Once | INTRAVENOUS | Status: AC
  Administered 2024-04-09: 1000 mL via INTRAVENOUS

## 2024-04-09 NOTE — ED Provider Notes (Signed)
 [facility] AT [facility] Provider Note   CSN: [id number] Arrival date & time: 04/09/24  1545     History  Chief Complaint  Patient presents with   Abdominal Pain    Pt reports intermittent global abdominal pain that started around 0700 this morning. Pt has hx of diverticulitis, reports this pain worsened after eating     Cathy Simmons is a 58 y.o. female here for evaluation abdominal pain, nausea and vomiting.  Started earlier today.  Abdominal pain is diffuse in nature.  No focal pain.  Does have history of diverticulitis.  Having normal bowel movements.  No blood in emesis or stool.  No sick contacts.  Active vomiting on arrival.  No recent travel or antibiotics.  HPI     Home Medications Prior to Admission medications   Medication Sig Start Date End Date Taking? Authorizing Provider  dicyclomine  (BENTYL ) 20 MG tablet Take 1 tablet (20 mg total) by mouth 2 (two) times daily. 04/09/24  Yes Valli Randol A, PA-C  ondansetron  (ZOFRAN -ODT) 4 MG disintegrating tablet Take 1 tablet (4 mg total) by mouth every 8 (eight) hours as needed. 04/09/24  Yes Jerardo Costabile A, PA-C  albuterol  (VENTOLIN  HFA) 108 (90 Base) MCG/ACT inhaler Inhale 2 puffs into the lungs every 6 (six) hours as needed for wheezing or shortness of breath. 04/03/21   O'Sullivan, Melissa, NP  benzonatate  (TESSALON ) 200 MG capsule Take 1 capsule (200 mg total) by mouth 2 (two) times daily as needed for cough. 12/16/23   Jobe Mulder, DO  cetirizine (ZYRTEC) 10 MG tablet Take 10 mg by mouth daily.    [provider]  Cholecalciferol (VITAMIN D-3) 1000 units CAPS Take 1,000 Units by mouth daily.    [provider]  clonazePAM  (KLONOPIN ) 0.5 MG tablet Take 1 tablet (0.5 mg total) by mouth 2 (two) times daily as needed. for anxiety 07/19/22   Jobe Mulder, DO  doxycycline  (VIBRA -TABS) 100 MG tablet Take 1 tablet (100 mg total) by mouth 2 (two) times  daily. 11/22/23   Jobe Mulder, DO  esomeprazole (NEXIUM) 20 MG capsule Take 20 mg by mouth daily at 12 noon.    [provider]  famotidine  (PEPCID ) 20 MG tablet Take 1 tablet (20 mg total) by mouth daily. 12/20/22   Saguier, Gaylin Ke, PA-C  fluconazole  (DIFLUCAN ) 150 MG tablet Take 1 tab, repeat in 72 hours if no improvement. 11/22/23   Jobe Mulder, DO  mirabegron  ER (MYRBETRIQ ) 50 MG TB24 tablet Take 1 tablet (50 mg total) by mouth daily. 10/21/23   Stoneking, Ponce Brisker., MD  promethazine -dextromethorphan (PROMETHAZINE -DM) 6.25-15 MG/5ML syrup Take 5 mLs by mouth 4 (four) times daily as needed for cough. 11/22/23   Jobe Mulder, DO  rosuvastatin  (CRESTOR ) 20 MG tablet Take 1 tablet by mouth once daily 02/10/24   Gwenette Lennox, Shellie Dials, DO  tobramycin  (TOBREX ) 0.3 % ophthalmic solution Place 1 drop into both eyes every 4 (four) hours. 11/08/22   Jobe Mulder, DO  vitamin B-12 (CYANOCOBALAMIN) 1000 MCG tablet Take 500-1,000 mcg by mouth daily.     [provider]  XARELTO  20 MG TABS tablet TAKE 1 TABLET BY MOUTH ONCE DAILY WITH SUPPER 02/14/24   Wendling, Shellie Dials, DO      Allergies    Bactrim [sulfamethoxazole-trimethoprim], Duloxetine, Adhesive [tape], and Cymbalta [duloxetine hcl]    Review of Systems   Review of Systems  Constitutional: Negative.   HENT: Negative.  Respiratory: Negative.    Cardiovascular: Negative.   Gastrointestinal:  Positive for abdominal pain, nausea and vomiting. Negative for abdominal distention, anal bleeding, blood in stool, constipation, diarrhea and rectal pain.  Genitourinary: Negative.   Musculoskeletal: Negative.   Skin: Negative.   Neurological: Negative.   All other systems reviewed and are negative.  Physical Exam Updated Vital Signs BP (!) 128/55 (BP Location: Right Arm)   Pulse (!) 110   Temp 98.2 F (36.8 C) (Oral)   Resp 16   Ht 5\' 2"  (1.575 m)   Wt 65.8 kg   SpO2 98%    BMI 26.52 kg/m  Physical Exam Vitals and nursing note reviewed.  Constitutional:      General: She is not in acute distress.    Appearance: She is well-developed. She is not ill-appearing, toxic-appearing or diaphoretic.  HENT:     Head: Normocephalic and atraumatic.  Eyes:     Pupils: Pupils are equal, round, and reactive to light.  Cardiovascular:     Rate and Rhythm: Normal rate.     Heart sounds: Normal heart sounds.  Pulmonary:     Effort: Pulmonary effort is normal. No respiratory distress.     Breath sounds: Normal breath sounds.  Abdominal:     General: Bowel sounds are normal. There is no distension.     Palpations: Abdomen is soft.     Tenderness: There is generalized abdominal tenderness.  Musculoskeletal:        General: Normal range of motion.     Cervical back: Normal range of motion.  Skin:    General: Skin is warm and dry.  Neurological:     General: No focal deficit present.     Mental Status: She is alert.  Psychiatric:        Mood and Affect: Mood normal.    ED Results / Procedures / Treatments   Labs (all labs ordered are listed, but only abnormal results are displayed) Labs Reviewed  CBC WITH DIFFERENTIAL/PLATELET - Abnormal; Notable for the following components:      Result Value   WBC 14.2 (*)    Neutro Abs 11.3 (*)    All other components within normal limits  COMPREHENSIVE METABOLIC PANEL WITH GFR - Abnormal; Notable for the following components:   Glucose, Bld 102 (*)    Creatinine, Ser 0.32 (*)    All other components within normal limits  URINALYSIS, W/ REFLEX TO CULTURE (INFECTION SUSPECTED) - Abnormal; Notable for the following components:   Hgb urine dipstick SMALL (*)    All other components within normal limits  LIPASE, BLOOD    EKG None  Radiology CT ABDOMEN PELVIS W CONTRAST Result Date: 04/09/2024 CLINICAL DATA:  Nausea vomiting and abdominal pain EXAM: CT ABDOMEN AND PELVIS WITH CONTRAST TECHNIQUE: Multidetector CT imaging  of the abdomen and pelvis was performed using the standard protocol following bolus administration of intravenous contrast. RADIATION DOSE REDUCTION: This exam was performed according to the departmental dose-optimization program which includes automated exposure control, adjustment of the mA and/or kV according to patient size and/or use of iterative reconstruction technique. CONTRAST:  OMNIPAQUE  IOHEXOL  300 MG/ML  SOLN COMPARISON:  CT 09/30/2023 FINDINGS: Lower chest: Lung bases demonstrate no acute airspace disease. Hepatobiliary: No calcified gallstone. Atrophy or scarring the left hepatic lobe with altered perfusion near falciform ligament, no change. No significant biliary dilatation Pancreas: Atrophic.  No inflammation Spleen: Surgically absent Adrenals/Urinary Tract: Adrenal glands are normal. Kidneys show no hydronephrosis. Multiple left kidney  stones, largest measuring 8 mm at the upper pole. The bladder is unremarkable Stomach/Bowel: The stomach is nonenlarged. There is no dilated small bowel. No acute bowel wall thickening. Negative appendix Vascular/Lymphatic: No significant vascular findings are present. No enlarged abdominal or pelvic lymph nodes. Reproductive: Uterus unremarkable. 3.6 x 3.2 cm right adnexal cyst, previously 3.2 cm and prior to that, 2.8 cm. Other: Negative for pelvic effusion or free air. Musculoskeletal: No acute or suspicious osseous abnormality. IMPRESSION: 1. No CT evidence for acute intra-abdominal or pelvic abnormality. 2. Left kidney stones. 3. 3.6 cm slowly enlarging right adnexal cyst. Nonemergent pelvic ultrasound recommended for further assessment. Electronically Signed   By: Esmeralda Hedge M.D.   On: 04/09/2024 20:48    Procedures Procedures    Medications Ordered in ED Medications  ondansetron  (ZOFRAN ) injection 4 mg (4 mg Intravenous Given 04/09/24 1645)  iohexol  (OMNIPAQUE ) 300 MG/ML solution 100 mL (100 mLs Intravenous Contrast Given 04/09/24 2015)   sodium chloride  0.9 % bolus 1,000 mL (1,000 mLs Intravenous New Bag/Given 04/09/24 2154)  ondansetron  (ZOFRAN ) injection 4 mg (4 mg Intravenous Given 04/09/24 2156)  morphine  (PF) 4 MG/ML injection 4 mg (4 mg Intravenous Given 04/09/24 2157)   ED Course/ Medical Decision Making/ A&P   58 year old here for evaluation of nausea, vomiting generalized abdominal pain that started earlier today.  Having normal bowel movements.  No recent travel, sick contacts, antibiotics.  No focal pain.  Has some generalized tenderness on exam.  No urinary complaints.  No recent falls or injuries.  Will plan on labs, imaging and reassess  Labs and imaging personally viewed and interpreted:  UA negative for infection Lipase 25 CBC leukocytosis 14.2 Metabolic panel without significant abnormality CT abd/pelvis without acute abnormality, does show right adnexal cyst, rec non emergent US >>> patient has no focal pain low suspicion for torsion  Patient getting IVF, pain meds.  Patient reassessed.  Pain controlled.  Tolerating p.o. intake.  We discussed labs and imaging.  She is aware of her right ovarian cyst and has had ultrasound with previously.  History exam today not consistent with torsion.  Patient is nontoxic, nonseptic appearing, in no apparent distress.  Patient's pain and other symptoms adequately managed in emergency department.  Fluid bolus given.  Labs, imaging and vitals reviewed.  Patient does not meet the SIRS or Sepsis criteria.  On repeat exam patient does not have a surgical abdomin and there are no peritoneal signs.  No indication of appendicitis, bowel obstruction, bowel perforation, cholecystitis, diverticulitis, PID,/persistent torsion, TOA or ectopic pregnancy.  Patient discharged home with symptomatic treatment and given strict instructions for follow-up with their primary care physician.  I have also discussed reasons to return immediately to the ER.  Patient expresses understanding and agrees  with plan.                                 Medical Decision Making Amount and/or Complexity of Data Reviewed Independent Historian: spouse External Data Reviewed: labs, radiology and notes. Labs: ordered. Decision-making details documented in ED Course. Radiology: ordered and independent interpretation performed. Decision-making details documented in ED Course.  Risk OTC drugs. Prescription drug management. Parenteral controlled substances. Decision regarding hospitalization. Diagnosis or treatment significantly limited by social determinants of health.           Final Clinical Impression(s) / ED Diagnoses Final diagnoses:  Generalized abdominal pain  Nausea and vomiting, unspecified vomiting type  Right  ovarian cyst    Rx / DC Orders ED Discharge Orders          Ordered    ondansetron  (ZOFRAN -ODT) 4 MG disintegrating tablet  Every 8 hours PRN        04/09/24 2254    dicyclomine  (BENTYL ) 20 MG tablet  2 times daily        04/09/24 2254              Garhett Bernhard A, PA-C 04/09/24 2256    Kommor, Alyse July, MD 04/10/24 1220

## 2024-04-09 NOTE — ED Provider Triage Note (Signed)
 Emergency Medicine Provider Triage Evaluation Note  Cathy Simmons , a 58 y.o. female  was evaluated in triage.  Pt complains of nausea vomiting and abdominal pain.  Started this morning.  Severe pain to the abdomen.  Multiple episodes of NBNB emesis.  No loose stool.  No sick contacts.  Review of Systems  Positive: Abd pain, emesis Negative:   Physical Exam  BP 139/66 (BP Location: Right Arm)   Pulse 92   Temp 98.1 F (36.7 C) (Oral)   Resp 14   Ht 5\' 2"  (1.575 m)   Wt 65.8 kg   SpO2 98%   BMI 26.52 kg/m  Gen:   Awake, active emesis Resp:  Normal effort  MSK:   Moves extremities without difficulty  Other:    Medical Decision Making  Medically screening exam initiated at 4:43 PM.  Appropriate orders placed.  SMITH ARMS was informed that the remainder of the evaluation will be completed by another provider, this initial triage assessment does not replace that evaluation, and the importance of remaining in the ED until their evaluation is complete.  Emesis, abd pain   Clarita Mcelvain A, PA-C 04/09/24 1644

## 2024-04-09 NOTE — Discharge Instructions (Addendum)
 It was a pleasure taking care of you here in the emergency department  Your workup today was reassuring.  We started you first medicines at home.  He did have an ovarian cyst on your right ovary.  This has gotten larger from your prior imaging.  They did recommend ultrasound which we do not have here tonight.  You did state that this was known previously.  I would recommend following up with your primary care provider for further imaging of this area  Make sure to follow-up outpatient, return for any worsening symptoms

## 2024-04-11 ENCOUNTER — Ambulatory Visit: Admitting: Family Medicine

## 2024-04-11 ENCOUNTER — Encounter: Payer: Self-pay | Admitting: Family Medicine

## 2024-04-11 VITALS — BP 128/68 | HR 73 | Temp 98.0°F | Resp 16 | Ht 62.0 in | Wt 143.0 lb

## 2024-04-11 DIAGNOSIS — J0100 Acute maxillary sinusitis, unspecified: Secondary | ICD-10-CM

## 2024-04-11 DIAGNOSIS — N949 Unspecified condition associated with female genital organs and menstrual cycle: Secondary | ICD-10-CM | POA: Diagnosis not present

## 2024-04-11 DIAGNOSIS — K2900 Acute gastritis without bleeding: Secondary | ICD-10-CM | POA: Diagnosis not present

## 2024-04-11 MED ORDER — PROMETHAZINE HCL 25 MG PO TABS: 25.0000 mg | ORAL_TABLET | Freq: Three times a day (TID) | ORAL | 0 refills | Status: DC | PRN

## 2024-04-11 MED ORDER — PREDNISONE 20 MG PO TABS: 40.0000 mg | ORAL_TABLET | Freq: Every day | ORAL | 0 refills | Status: DC

## 2024-04-11 NOTE — Patient Instructions (Signed)
 For the swelling in your lower extremities, be sure to elevate your legs when able, mind the salt intake, stay physically active and consider wearing compression stockings.  Stay hydrated.  As long as you are vomiting or having diarrhea, please drink fluids with electrolytes like Gatorade, Powerade, or Pedialyte (if you can stomach the taste). Drink these along with water mainly.   Let us  know if you need anything.

## 2024-04-11 NOTE — Progress Notes (Signed)
 Chief Complaint  Patient presents with   Follow-up    Follow up      Subjective Cathy Simmons is a 58 y.o. female who presents with nausea and vomiting. Here w spouse.  Symptoms began 2 d ago.  Patient has abdominal pain, sinus pain, sinus congestion, dental pain and vomiting Patient denies diarrhea and fever Treatment to date: Zofran  Sick contacts: none known She went to the emergency department and had abdominal imaging done.  It did not show anything sinister in the GI tract.  It showed 3.6 cm ovarian cyst that is enlarged from last visit.  A dedicated ultrasound was recommended to follow-up on this.  The scan also kidney stones that are nonobstructing.  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   Depression    Diverticulitis    Family history of anesthesia complication    mother difficult to wake   GERD (gastroesophageal reflux disease)    History of blood clots    Hyperlipidemia    Osteoporosis     Exam BP 128/68 (BP Location: Left Arm, Patient Position: Sitting)   Pulse 73   Temp 98 F (36.7 C) (Oral)   Resp 16   Ht 5\' 2"  (1.575 m)   Wt 143 lb (64.9 kg)   SpO2 98%   BMI 26.16 kg/m  General:  well developed, well hydrated, in no apparent distress Skin:  warm, no pallor or diaphoresis, no rashes Throat/Pharynx:  lips and gingiva without lesion; tongue and uvula midline; non-inflamed pharynx; no exudates or postnasal drainage Lungs:  clear to auscultation, breath sounds equal bilaterally, no respiratory distress, no wheezes HEENT: Canals patent w/o otorrhea, TM's neg, +TTP over max sinuses, nares patent w/o rhinorrhea, MMM, no pharyngeal exudate/erythema Cardio:  RRR Abdomen:  abdomen soft, TTP in LUQ region; bowel sounds normal; no masses or organomegaly Psych: Appropriate judgement/insight  Assessment and Plan  Adnexal cyst - Plan: US  Transvaginal Non-OB  Acute  gastritis, presence of bleeding unspecified, unspecified gastritis type - Plan: promethazine  (PHENERGAN ) 25 MG tablet  Acute maxillary sinusitis, recurrence not specified - Plan: predniSONE  (DELTASONE ) 20 MG tablet  Follow-up on the ovarian cyst with an ultrasound as recommended by radiology.  I do not think this is contributing to her current issue.  I filled out paperwork for FMLA for the situation.  It was filled out for both her and her husband who has been taking care of her. Change Zofran  to Phenergan .  Avoid aggravating foods, discussed advancing diet.  She will let me know in the next few days if not improving and we will consider setting her up with the infusion clinic for an IV fluid bolus. 5-day prednisone  burst.  Could be due to regurgitation into her sinuses. The patient voiced understanding and agreement to the plan.  I spent 32 minutes with the patient discussing the above plans in addition to reviewing her chart and filling out forms on the same day of the visit.  Shellie Dials [place], DO 04/11/24  12:26 PM

## 2024-04-13 ENCOUNTER — Ambulatory Visit (HOSPITAL_BASED_OUTPATIENT_CLINIC_OR_DEPARTMENT_OTHER)
Admission: RE | Admit: 2024-04-13 | Discharge: 2024-04-13 | Disposition: A | Discharge status: Home / Self Care | Source: Ambulatory Visit | Attending: Family Medicine | Admitting: Family Medicine

## 2024-04-13 DIAGNOSIS — N83291 Other ovarian cyst, right side: Secondary | ICD-10-CM | POA: Diagnosis not present

## 2024-04-13 DIAGNOSIS — N949 Unspecified condition associated with female genital organs and menstrual cycle: Secondary | ICD-10-CM | POA: Insufficient documentation

## 2024-04-16 ENCOUNTER — Ambulatory Visit: Admitting: Family Medicine

## 2024-04-16 ENCOUNTER — Encounter: Payer: Self-pay | Admitting: Family Medicine

## 2024-04-16 VITALS — BP 124/64 | HR 80 | Temp 98.0°F | Resp 16 | Ht 62.0 in | Wt 143.0 lb

## 2024-04-16 DIAGNOSIS — K2900 Acute gastritis without bleeding: Secondary | ICD-10-CM

## 2024-04-16 NOTE — Progress Notes (Signed)
 Chief Complaint  Patient presents with   Follow-up    Follow Up    Subjective: Patient is a 58 y.o. female here for fu.  Patient here for follow-up gastroenteritis and sinusitis.  She is feeling much better overall.  Compliant with medication.  She is requesting a release to work.  She is no longer having nausea, vomiting, bowel changes, abdominal pain, or upper respiratory symptoms.  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 No accessory muscle use Abdomen: Muscles benign, soft, nontender, nondistended Psych: Age appropriate judgment and insight, normal affect and mood  Assessment and Plan: Acute gastritis, presence of bleeding unspecified, unspecified gastritis type  Significantly proved.  Continue promethazine  as needed.  Form filled out for work.  Faxed today, copy provided to the patient. The patient voiced understanding and agreement to the plan.  Shellie Dials [place], DO 04/16/24  11:12 AM

## 2024-04-16 NOTE — Patient Instructions (Signed)
 OK to continue meds as needed.  Let us  know if you need anything.

## 2024-04-18 ENCOUNTER — Ambulatory Visit: Payer: Self-pay | Admitting: Family Medicine

## 2024-04-30 ENCOUNTER — Ambulatory Visit: Payer: Self-pay | Admitting: Family Medicine

## 2024-04-30 ENCOUNTER — Encounter: Payer: Self-pay | Admitting: Family Medicine

## 2024-04-30 ENCOUNTER — Ambulatory Visit: Admitting: Family Medicine

## 2024-04-30 ENCOUNTER — Ambulatory Visit: Payer: Self-pay

## 2024-04-30 VITALS — BP 109/60 | HR 94 | Temp 97.9°F | Ht 62.0 in | Wt 142.0 lb

## 2024-04-30 DIAGNOSIS — R1030 Lower abdominal pain, unspecified: Secondary | ICD-10-CM

## 2024-04-30 DIAGNOSIS — R112 Nausea with vomiting, unspecified: Secondary | ICD-10-CM | POA: Diagnosis not present

## 2024-04-30 DIAGNOSIS — R197 Diarrhea, unspecified: Secondary | ICD-10-CM | POA: Diagnosis not present

## 2024-04-30 LAB — COMPREHENSIVE METABOLIC PANEL WITH GFR
ALT: 21 U/L (ref 0–35)
AST: 22 U/L (ref 0–37)
Albumin: 4.4 g/dL (ref 3.5–5.2)
Alkaline Phosphatase: 78 U/L (ref 39–117)
BUN: 9 mg/dL (ref 6–23)
CO2: 28 meq/L (ref 19–32)
Calcium: 9.6 mg/dL (ref 8.4–10.5)
Chloride: 104 meq/L (ref 96–112)
Creatinine, Ser: 0.58 mg/dL (ref 0.40–1.20)
GFR: 99.28 mL/min (ref >60.00)
Glucose, Bld: 101 mg/dL — ABNORMAL HIGH (ref 70–99)
Potassium: 3.6 meq/L (ref 3.5–5.1)
Sodium: 140 meq/L (ref 135–145)
Total Bilirubin: 0.5 mg/dL (ref 0.2–1.2)
Total Protein: 7.4 g/dL (ref 6.0–8.3)

## 2024-04-30 LAB — CBC WITH DIFFERENTIAL/PLATELET
Basophils Absolute: 0.1 10*3/uL (ref 0.0–0.1)
Basophils Relative: 0.6 % (ref 0.0–3.0)
Eosinophils Absolute: 0 10*3/uL (ref 0.0–0.7)
Eosinophils Relative: 0.1 % (ref 0.0–5.0)
HCT: 41.1 % (ref 36.0–46.0)
Hemoglobin: 13.9 g/dL (ref 12.0–15.0)
Lymphocytes Relative: 24.2 % (ref 12.0–46.0)
Lymphs Abs: 1.9 10*3/uL (ref 0.7–4.0)
MCHC: 33.7 g/dL (ref 30.0–36.0)
MCV: 92.8 fl (ref 78.0–100.0)
Monocytes Absolute: 0.6 10*3/uL (ref 0.1–1.0)
Monocytes Relative: 7.8 % (ref 3.0–12.0)
Neutro Abs: 5.3 10*3/uL (ref 1.4–7.7)
Neutrophils Relative %: 67.3 % (ref 43.0–77.0)
Platelets: 253 10*3/uL (ref 150.0–400.0)
RBC: 4.43 Mil/uL (ref 3.87–5.11)
RDW: 13.7 % (ref 11.5–15.5)
WBC: 7.9 10*3/uL (ref 4.0–10.5)

## 2024-04-30 LAB — POC URINALSYSI DIPSTICK (AUTOMATED)
Bilirubin, UA: NEGATIVE
Blood, UA: NEGATIVE
Glucose, UA: NEGATIVE
Leukocytes, UA: NEGATIVE
Nitrite, UA: NEGATIVE
Protein, UA: NEGATIVE
Spec Grav, UA: 1.015 (ref 1.010–1.025)
Urobilinogen, UA: 0.2 U/dL
pH, UA: 7 (ref 5.0–8.0)

## 2024-04-30 NOTE — Progress Notes (Signed)
 Acute Office Visit  Subjective:     Patient ID: Cathy Simmons, female    DOB: 01/06/1966, 58 y.o.   MRN: [id number]  Chief Complaint  Patient presents with   Nasal Congestion   Abdominal Pain     Patient is in today for abdominal discomfort, n/v/d.   Discussed the use of AI scribe software for clinical note transcription with the patient, who gave verbal consent to proceed.  History of Present Illness Cathy Simmons is a 58 year old female presents with abdominal pain nausea, vomiting, and headache.  Symptoms began yesterday evening with sudden onset abdominal pain, primarily on the left lower abdomen, occasionally radiating up under the ribs. The pain is sharp and lasts a couple of minutes. Nausea and vomiting started later that night, and despite taking sublingual nausea medication, she was unable to keep anything down, including water. She recalls a similar episode two to three weeks ago, which led to an ER visit.  Current symptoms include a frontal headache, described as sinus-related, with pain radiating into her teeth. She reports sinus congestion and a runny nose. The headache is rated as a 5 out of 10 in severity and worsens in sunlight. No slurred speech, unusual weakness, or confusion.  She has experienced fluctuating bowel movements with three to four bowel movements today that were loose. She has a history of diverticula and recalls a past severe episode of diverticulitis, which was more painful than her current symptoms.  She has not been able to keep any fluids down since last night, which she believes may be contributing to her headache.   Her social history includes working in close proximity to others, which she acknowledges as a potential source of exposure to illness.          All review of systems negative except what is listed in the HPI      Objective:     BP 109/60   Pulse 94   Temp 97.9 F (36.6 C) (Oral)   Ht 5\' 2"  (1.575 m)   Wt 142  lb (64.4 kg)   SpO2 96%   BMI 25.97 kg/m    Physical Exam Vitals reviewed.  Constitutional:      General: She is not in acute distress.    Appearance: Normal appearance. She is well-developed. She is not ill-appearing.  Eyes:     Pupils: Pupils are equal, round, and reactive to light.  Cardiovascular:     Rate and Rhythm: Normal rate and regular rhythm.  Pulmonary:     Effort: Pulmonary effort is normal. No respiratory distress.  Abdominal:     General: Abdomen is flat. Bowel sounds are normal.     Palpations: Abdomen is soft.     Tenderness: There is generalized abdominal tenderness.     Hernia: No hernia is present.  Musculoskeletal:     Cervical back: Normal range of motion and neck supple.  Skin:    General: Skin is warm.  Neurological:     Mental Status: She is alert and oriented to person, place, and time.     Sensory: No sensory deficit.     Motor: No weakness.     Coordination: Coordination normal.     Gait: Gait normal.  Psychiatric:        Mood and Affect: Mood normal.        Behavior: Behavior normal.        Thought Content: Thought content normal.  Judgment: Judgment normal.           Assessment & Plan:   Problem List Items Addressed This Visit   None Visit Diagnoses       Lower abdominal pain    -  Primary   Relevant Orders   POCT Urinalysis Dipstick (Automated) (Completed)   CBC with Differential/Platelet (Completed)   Comprehensive metabolic panel with GFR (Completed)     Nausea vomiting and diarrhea       Relevant Orders   CBC with Differential/Platelet (Completed)   Comprehensive metabolic panel with GFR (Completed)      Assessment & Plan  Acute onset with vomiting/loose stools - possible dehydration. Possible viral gastroenteritis or diverticulitis exacerbation. Recent CT scan 2 weeks ago and pelvic US  showing right ovarian cyst. Mild frontal headache likely due to dehydration. No neurological symptoms. Discussed workup options  with patient and they opted for the following: - Order blood work  - Advise antiemetic medication and fluid intake. States she already has meds at home. - Consider IV fluids and medication if unable to retain fluids. - Monitor symptoms, consider antibiotics if worsening. - Consider repeat CT scan if no improvement.   Patient/spouse aware of signs/symptoms requiring further/urgent evaluation.      No orders of the defined types were placed in this encounter.   Return if symptoms worsen or fail to improve.  Everlina Hock, NP

## 2024-04-30 NOTE — Telephone Encounter (Signed)
 Copied from CRM [id number]. Topic: Clinical - Red Word Triage >> Apr 30, 2024  8:50 AM Cathy Simmons wrote: Red Word that prompted transfer to Nurse Triage: patient calling, vomiting, fever loose stools, bottom of stomach and up hurt, head pressure and pain , teeth hurt  Chief Complaint: sinus congestion& pain, nausea, abd pain  Symptoms: fever, headache, head pressure, n/v, center abd pain that Simmons and goes 8/10, fever  Frequency: 1.5 to 2 weeks Pertinent Negatives: Patient denies SOB Disposition: [] ED /[] Urgent Care (no appt availability in office) / [x] Appointment(In office/virtual)/ []  [facility]/ [] Home Care/ [] Refused Recommended Disposition /[]  Mobile Bus/ []  Follow-up with PCP Additional Notes: pt has been in last 2 weeks for abd pain: pt states 8/10 pain continues to come and go: pt has n/v therefore food intake is not well at present time & now having sinus congestion.  Scheduled in office visit for today.  Reason for Disposition  [1] MODERATE pain (e.g., interferes with normal activities) AND [2] Simmons and goes (cramps) AND [3] present > 24 hours  (Exception: Pain with Vomiting or Diarrhea - see that Guideline.)  Fever present > 3 days (72 hours)  [1] Sinus congestion (pressure, fullness) AND [2] present > 10 days  Answer Assessment - Initial Assessment Questions 1. LOCATION: "Where does it hurt?"      Teeth hurt 2. ONSET: "When did the sinus pain start?"  (e.g., hours, days)      yesterday 3. SEVERITY: "How bad is the pain?"   (Scale 1-10; mild, moderate or severe)   - MILD (1-3): doesn't interfere with normal activities    - MODERATE (4-7): interferes with normal activities (e.g., work or school) or awakens from sleep   - SEVERE (8-10): excruciating pain and patient unable to do any normal activities        5/10 4. RECURRENT SYMPTOM: "Have you ever had sinus problems before?" If Yes, ask: "When was the last time?" and "What happened that time?"      yes 5.  NASAL CONGESTION: "Is the nose blocked?" If Yes, ask: "Can you open it or must you breathe through your mouth?"     Stuffy and runny nose 6. NASAL DISCHARGE: "Do you have discharge from your nose?" If so ask, "What color?"     clear 7. FEVER: "Do you have a fever?" If Yes, ask: "What is it, how was it measured, and when did it start?"      100 8. OTHER SYMPTOMS: "Do you have any other symptoms?" (e.g., sore throat, cough, earache, difficulty breathing)     N/a 9. PREGNANCY: "Is there any chance you are pregnant?" "When was your last menstrual period?"     N/a  Answer Assessment - Initial Assessment Questions 1. NAUSEA SEVERITY: "How bad is the nausea?" (e.g., mild, moderate, severe; dehydration, weight loss)   - MILD: loss of appetite without change in eating habits   - MODERATE: decreased oral intake without significant weight loss, dehydration, or malnutrition   - SEVERE: inadequate caloric or fluid intake, significant weight loss, symptoms of dehydration     moderate 2. ONSET: "When did the nausea begin?"     yesterday 3. VOMITING: "Any vomiting?" If Yes, ask: "How many times today?"     x2 4. RECURRENT SYMPTOM: "Have you had nausea before?" If Yes, ask: "When was the last time?" "What happened that time?"     N/a 5. CAUSE: "What do you think is causing the nausea?"  Unknown  6. PREGNANCY: "Is there any chance you are pregnant?" (e.g., unprotected intercourse, missed birth control pill, broken condom)     N/a  Answer Assessment - Initial Assessment Questions 1. LOCATION: "Where does it hurt?" Abd center 8/10 pain that Simmons and goes    2.. RADIATION: "Does the pain shoot anywhere else?" (e.g., chest, back)     N/a 3. ONSET: "When did the pain begin?" (e.g., minutes, hours or days ago)      2 weeks 4. SUDDEN: "Gradual or sudden onset?"     Sudden 5. PATTERN "Does the pain come and go, or is it constant?"    - If it Simmons and goes: "How long does it last?" "Do you have pain  now?"     (Note: Simmons and goes means the pain is intermittent. It goes away completely between bouts.)    - If constant: "Is it getting better, staying the same, or getting worse?"      (Note: Constant means the pain never goes away completely; most serious pain is constant and gets worse.)      Simmons and goes 6. SEVERITY: "How bad is the pain?"  (e.g., Scale 1-10; mild, moderate, or severe)    - MILD (1-3): Doesn't interfere with normal activities, abdomen soft and not tender to touch..     - MODERATE (4-7): Interferes with normal activities or awakens from sleep, abdomen tender to touch.     - SEVERE (8-10): Excruciating pain, doubled over, unable to do any normal activities.       servere 7. RECURRENT SYMPTOM: "Have you ever had this type of stomach pain before?" If Yes, ask: "When was the last time?" and "What happened that time?"      N/a 8. AGGRAVATING FACTORS: "Does anything seem to cause this pain?" (e.g., foods, stress, alcohol)     N/a 9. CARDIAC SYMPTOMS: "Do you have any of the following symptoms: chest pain, difficulty breathing, sweating, nausea?"     N/a 10. OTHER SYMPTOMS: "Do you have any other symptoms?" (e.g., back pain, diarrhea, fever, urination pain, vomiting)       Diarrhea, fever 11. PREGNANCY: "Is there any chance you are pregnant?" "When was your last menstrual period?"       N/a  Protocols used: Sinus Pain or Congestion-A-AH, Nausea-A-AH, Abdominal Pain - Upper-A-AH

## 2024-05-10 ENCOUNTER — Other Ambulatory Visit: Payer: Self-pay | Admitting: Family Medicine

## 2024-05-10 DIAGNOSIS — E7800 Pure hypercholesterolemia, unspecified: Secondary | ICD-10-CM

## 2024-05-13 ENCOUNTER — Other Ambulatory Visit: Payer: Self-pay | Admitting: Family Medicine

## 2024-07-20 ENCOUNTER — Encounter: Payer: Self-pay | Admitting: Family Medicine

## 2024-07-20 ENCOUNTER — Ambulatory Visit (INDEPENDENT_AMBULATORY_CARE_PROVIDER_SITE_OTHER): Admitting: Family Medicine

## 2024-07-20 VITALS — BP 124/72 | HR 77 | Temp 98.0°F | Resp 16 | Ht 62.0 in | Wt 142.8 lb

## 2024-07-20 DIAGNOSIS — R310 Gross hematuria: Secondary | ICD-10-CM | POA: Diagnosis not present

## 2024-07-20 DIAGNOSIS — Z23 Encounter for immunization: Secondary | ICD-10-CM

## 2024-07-20 DIAGNOSIS — Z0000 Encounter for general adult medical examination without abnormal findings: Secondary | ICD-10-CM

## 2024-07-20 DIAGNOSIS — Z0001 Encounter for general adult medical examination with abnormal findings: Secondary | ICD-10-CM | POA: Diagnosis not present

## 2024-07-20 DIAGNOSIS — N200 Calculus of kidney: Secondary | ICD-10-CM | POA: Diagnosis not present

## 2024-07-20 MED ORDER — TAMSULOSIN HCL 0.4 MG PO CAPS: 0.4000 mg | ORAL_CAPSULE | Freq: Every day | ORAL | 0 refills | Status: DC

## 2024-07-20 NOTE — Addendum Note (Signed)
 Addended by: Jane Broughton M on: 07/20/2024 03:29 PM   Modules accepted: Orders

## 2024-07-20 NOTE — Patient Instructions (Signed)
 Give us  2-3 business days to get the results of your labs back.   Keep the diet clean and stay active.  Please get me a copy of your advanced directive form at your convenience.   I recommend getting the flu shot in mid October. This suggestion would change if the CDC comes out with a different recommendation.   Let us  know if you need anything.

## 2024-07-20 NOTE — Progress Notes (Signed)
 Chief Complaint  Patient presents with   Annual Exam    CPE     Well Woman Cathy Simmons is here for a complete physical.   Her last physical was >1 year ago.  Current diet: in general, a healthy diet. Current exercise: active at work. Weight is stable and she denies fatigue out of ordinary. No LMP recorded. Patient is perimenopausal. Seatbelt? Yes Advanced directive? 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   Depression    Diverticulitis    Family history of anesthesia complication    mother difficult to wake   GERD (gastroesophageal reflux disease)    History of blood clots    Hyperlipidemia    Osteoporosis      Past Surgical History:  Procedure Laterality Date   BREAST BIOPSY Right 2015   benign cysts showing apocrine metaplasia   BREAST EXCISIONAL BIOPSY Left 1995   2 times left breast, benign both times   DILATION AND CURETTAGE OF UTERUS  x2 1984 and 1991   excision left ganglion cyst Left    FOREIGN BODY REMOVAL Right 01/29/2013   Procedure: EXCISION FOREIGN BODY RIGHT SMALL FINGER;  Surgeon: Arley JONELLE Curia, MD;  Location: [facility];  Service: Orthopedics;  Laterality: Right;   removal spleen  2000    Medications  Current Outpatient Medications on File Prior to Visit  Medication Sig Dispense Refill   cetirizine (ZYRTEC) 10 MG tablet Take 10 mg by mouth daily.     clonazePAM  (KLONOPIN ) 0.5 MG tablet Take 1 tablet (0.5 mg total) by mouth 2 (two) times daily as needed. for anxiety 30 tablet 5   esomeprazole (NEXIUM) 20 MG capsule Take 20 mg by mouth daily at 12 noon.     promethazine  (PHENERGAN ) 25 MG tablet Take 1 tablet (25 mg total) by mouth every 8 (eight) hours as needed for nausea or vomiting. 20  tablet 0   rosuvastatin  (CRESTOR ) 20 MG tablet Take 1 tablet (20 mg total) by mouth daily. 90 tablet 1   vitamin B-12 (CYANOCOBALAMIN) 1000 MCG tablet Take 500-1,000 mcg by mouth daily.      XARELTO  20 MG TABS tablet TAKE 1 TABLET BY MOUTH ONCE DAILY WITH SUPPER 90 tablet 0    Allergies Allergies  Allergen Reactions   Pneumococcal Vaccine Swelling    Swelling of arm   Bactrim [Sulfamethoxazole-Trimethoprim] Other (See Comments)    Sulfa causes yeast infections!   Duloxetine     Other Reaction(s): Other (See Comments)  Passed out   Adhesive [Tape] Rash and Other (See Comments)    Band-Aids   Cymbalta [Duloxetine Hcl] Anxiety    Review of Systems: Constitutional:  no unexpected weight changes Eye:  no recent significant change in vision Ear/Nose/Mouth/Throat:  Ears:  no recent change in hearing Nose/Mouth/Throat:  no complaints of nasal congestion, no sore throat Cardiovascular: no chest pain Respiratory:  no shortness of breath Gastrointestinal:  no abdominal pain, no change in bowel habits GU:  Female: negative for dysuria or pelvic pain Musculoskeletal/Extremities:  no pain of the joints Integumentary (Skin/Breast):  no abnormal skin lesions reported Neurologic:  no headaches Endocrine:  denies fatigue  Exam BP 124/72 (BP Location: Left Arm, Patient Position: Sitting)  Pulse 77   Temp 98 F (36.7 C) (Oral)   Resp 16   Ht 5' 2 (1.575 m)   Wt 142 lb 12.8 oz (64.8 kg)   SpO2 96%   BMI 26.12 kg/m  General:  well developed, well nourished, in no apparent distress Skin:  no significant moles, warts, or growths Head:  no masses, lesions, or tenderness Eyes:  pupils equal and round, sclera anicteric without injection Ears:  canals without lesions, TMs shiny without retraction, no obvious effusion, no erythema Nose:  nares patent, mucosa normal, and no drainage  Throat/Pharynx:  lips and gingiva without lesion; tongue and uvula midline; non-inflamed pharynx; no exudates  or postnasal drainage Neck: neck supple without adenopathy, thyromegaly, or masses Lungs:  clear to auscultation, breath sounds equal bilaterally, no respiratory distress Cardio:  regular rate and rhythm, no LE edema Abdomen:  abdomen soft, nontender; bowel sounds normal; no masses or organomegaly Genital: Defer to GYN Musculoskeletal:  symmetrical muscle groups noted without atrophy or deformity; negative Lloyd sign bilaterally, no CVA TTP Extremities:  no clubbing, cyanosis, or edema, no deformities, no skin discoloration Neuro:  gait normal; deep tendon reflexes normal and symmetric Psych: well oriented with normal range of affect and appropriate judgment/insight  Assessment and Plan  Well adult exam - Plan: Hepatitis B surface antibody,quantitative, CBC, Comprehensive metabolic panel with GFR, Lipid panel, CANCELED: CBC, CANCELED: Comprehensive metabolic panel with GFR, CANCELED: Lipid panel, CANCELED: CBC, CANCELED: Comprehensive metabolic panel with GFR, CANCELED: Lipid panel  Gross hematuria - Plan: Urine Culture, Urine Microscopic Only, Urine Microscopic Only, CANCELED: Urine Microscopic Only  Kidney stone - Plan: tamsulosin  (FLOMAX ) 0.4 MG CAPS capsule   Well 58 y.o. female. Counseled on diet and exercise. Advanced directive form provided today.  Kidney stones: Known on L. Tx w Flomax .  Check urine today to rule out infection.  No obvious signs of infection or stone refractory to passing today. Shingrix rec'd.  Screen hep B. PCV20 today.  Other orders as above. Follow up in 6 mo. The patient voiced understanding and agreement to the plan.  Mabel [place], DO 07/20/24 2:31 PM

## 2024-07-21 ENCOUNTER — Ambulatory Visit: Payer: Self-pay | Admitting: Family Medicine

## 2024-07-21 LAB — LIPID PANEL
Cholesterol: 148 mg/dL (ref <200)
HDL: 65 mg/dL (ref >=50)
LDL Cholesterol (Calc): 68 mg/dL
Non-HDL Cholesterol (Calc): 83 mg/dL (ref <130)
Total CHOL/HDL Ratio: 2.3 (calc) (ref <5.0)
Triglycerides: 69 mg/dL (ref <150)

## 2024-07-21 LAB — COMPREHENSIVE METABOLIC PANEL WITH GFR
AG Ratio: 1.6 (calc) (ref 1.0–2.5)
ALT: 18 U/L (ref 6–29)
AST: 20 U/L (ref 10–35)
Albumin: 4.4 g/dL (ref 3.6–5.1)
Alkaline phosphatase (APISO): 86 U/L (ref 37–153)
BUN: 16 mg/dL (ref 7–25)
CO2: 27 mmol/L (ref 20–32)
Calcium: 9.7 mg/dL (ref 8.6–10.4)
Chloride: 106 mmol/L (ref 98–110)
Creat: 0.55 mg/dL (ref 0.50–1.03)
Globulin: 2.8 g/dL (ref 1.9–3.7)
Glucose, Bld: 89 mg/dL (ref 65–99)
Potassium: 4 mmol/L (ref 3.5–5.3)
Sodium: 141 mmol/L (ref 135–146)
Total Bilirubin: 0.5 mg/dL (ref 0.2–1.2)
Total Protein: 7.2 g/dL (ref 6.1–8.1)
eGFR: 106 mL/min/1.73m2 (ref >=60)

## 2024-07-21 LAB — CBC
HCT: 41.6 % (ref 35.0–45.0)
Hemoglobin: 13.9 g/dL (ref 11.7–15.5)
MCH: 31 pg (ref 27.0–33.0)
MCHC: 33.4 g/dL (ref 32.0–36.0)
MCV: 92.9 fL (ref 80.0–100.0)
MPV: 11.4 fL (ref 7.5–12.5)
Platelets: 310 Thousand/uL (ref 140–400)
RBC: 4.48 Million/uL (ref 3.80–5.10)
RDW: 13 % (ref 11.0–15.0)
WBC: 8.4 Thousand/uL (ref 3.8–10.8)

## 2024-07-21 LAB — HEPATITIS B SURFACE ANTIBODY, QUANTITATIVE: Hep B S AB Quant (Post): <5 m[IU]/mL — ABNORMAL LOW (ref >=10)

## 2024-07-22 DIAGNOSIS — N200 Calculus of kidney: Secondary | ICD-10-CM | POA: Diagnosis not present

## 2024-07-22 DIAGNOSIS — N23 Unspecified renal colic: Secondary | ICD-10-CM | POA: Diagnosis not present

## 2024-07-22 DIAGNOSIS — N132 Hydronephrosis with renal and ureteral calculous obstruction: Secondary | ICD-10-CM | POA: Diagnosis not present

## 2024-07-22 DIAGNOSIS — Z86718 Personal history of other venous thrombosis and embolism: Secondary | ICD-10-CM | POA: Diagnosis not present

## 2024-07-22 DIAGNOSIS — R109 Unspecified abdominal pain: Secondary | ICD-10-CM | POA: Diagnosis not present

## 2024-07-22 DIAGNOSIS — R319 Hematuria, unspecified: Secondary | ICD-10-CM | POA: Diagnosis not present

## 2024-07-22 DIAGNOSIS — R1032 Left lower quadrant pain: Secondary | ICD-10-CM | POA: Diagnosis not present

## 2024-07-22 DIAGNOSIS — N83291 Other ovarian cyst, right side: Secondary | ICD-10-CM | POA: Diagnosis not present

## 2024-07-22 DIAGNOSIS — E785 Hyperlipidemia, unspecified: Secondary | ICD-10-CM | POA: Diagnosis not present

## 2024-07-22 DIAGNOSIS — K5730 Diverticulosis of large intestine without perforation or abscess without bleeding: Secondary | ICD-10-CM | POA: Diagnosis not present

## 2024-07-22 DIAGNOSIS — K219 Gastro-esophageal reflux disease without esophagitis: Secondary | ICD-10-CM | POA: Diagnosis not present

## 2024-07-22 LAB — URINALYSIS, MICROSCOPIC ONLY
Bacteria, UA: NONE SEEN /HPF
Hyaline Cast: NONE SEEN /LPF

## 2024-07-22 LAB — URINE CULTURE
MICRO NUMBER:: 16870694
SPECIMEN QUALITY:: ADEQUATE

## 2024-07-22 LAB — EXTRA URINE SPECIMEN

## 2024-07-22 NOTE — ED Provider Notes (Addendum)
 Emergency Department Provider Note    ED Clinical Impression   Final diagnoses:  Flank pain (Primary)  H/O cerebral venous sinus thrombosis  Renal colic on left side    ED Assessment/Plan    History   Chief Complaint  Patient presents with  . Flank Pain   HPI  2220 hours. Patient 58 year old female onset of left flank pain 1 week ago with hematuria by exams awake alert cooperative.  Cardiorespiratory general neuroexam negative abdomen left lower quadrant pain and tenderness to take Xarelto  for previous thrombo-CNS thrombosis we will go to consider for outpatient treatment referral  Past Medical History[1]  Past Surgical History[2]  Family History[3]  Social History[4]  Review of Systems  Respiratory:  Negative for chest tightness and shortness of breath.   Gastrointestinal:  Positive for abdominal pain.  Genitourinary:  Positive for flank pain and hematuria.  All other systems reviewed and are negative.   Physical Exam   BP 132/68   Pulse 102   Temp 36.4 C (97.5 F) (Temporal)   Resp 20   Ht 157.5 cm (5' 2)   Wt 64.9 kg (143 lb)   SpO2 99%   BMI 26.16 kg/m   Physical Exam  Vital signs have been reviewed. Patient is well-appearing w/o respiratory distress shock or major trauma. HEENT is atraumatic.  Neck shows unimpaired range of motion. Chest No increased work of breathing or audible wheezing. Cardiac Good perfusion throughout.  Left flank left abdominal pain and tenderness Abdomen is not distended. Extremities are without significant trauma. Skin no visualized rash. Neuro exam is grossly non-focal. Psych exam shows normal mood and behavior.    ED Course    Findings are consistent with renal colic patient has previous history of hypercoagulable state complicated by cerebral thrombosis she is on Xarelto  will consider for reversal in the meantime CAT scanning is planned   Medical Decision Making    Triage old records reviewed patient with  history of cerebral venous sinus thrombosis diagnosed 2017.  She is currently anticoagulated with history of blood clots.  Most recent Doppler ultrasound of lower extremities 2024 was negative also negative in 2017.  She has had chronic venous sinus thrombosis in the left transverse and sigmoid sinus this is from an brain MRI in 2018  2300 hrs.  Findings consistent with large obstructing stone probably 7 to 8 mm on the left side patient symptoms are improved I think would be a candidate for outpatient referral for lithotripsy of counseled her to seek medical attention after a hospital with urology such as in [place] should she have worsening symptoms as she would then require acute stenting at current time I do not think she requires additional diagnostic testing imaging or consultation on emergency basis tonight  At the current time we recommend you continue your Xarelto  for previous treatment of hypercoagulable state and cerebral venous sinus thrombosis however please notify urology of your anticoagulation as this will likely need to be reversed prior to any procedure  2320 hrs. Have discussed various options including possible transfer patient leaving AMA or being discharged.  Will try to get her symptoms under reasonable control under those conditions we will go ahead and release her with outpatient follow-up otherwise I will offer them the option of transfer to suitable facility for intractable symptoms for stenting.  We have gone ahead and decided to give some Toradol  despite the anticoagulation just to try to help with some of the patient's symptoms  2350 hrs. Patient resting comfortably  looks better clinically we will plan for outpatient treatment referral     Dallara, Norleen Agent, MD 07/22/24 2222    Dallara, John James, MD 07/22/24 2259    Dallara, John James, MD 07/22/24 [id number]    Dallara, John James, MD 07/22/24 2326       [1] Past Medical History: Diagnosis Date  .  Blood clot in vein   . GERD (gastroesophageal reflux disease)   . Hyperlipidemia   . Renal disorder   [2] No past surgical history on file. [3] No family history on file. [4] Social History Socioeconomic History  . Marital status: Married   Social Drivers of Corporate investment banker Strain: Low Risk  (04/13/2024)   Received from [facility]   Overall Financial Resource Strain (CARDIA)   . Difficulty of Paying Living Expenses: Not very hard  Food Insecurity: No Food Insecurity (04/13/2024)   Received from [facility]   Hunger Vital Sign   . Within the past 12 months, you worried that your food would run out before you got the money to buy more.: Never true   . Within the past 12 months, the food you bought just didn't last and you didn't have money to get more.: Never true  Transportation Needs: No Transportation Needs (04/13/2024)   Received from [facility] - Transportation   . Lack of Transportation (Medical): No   . Lack of Transportation (Non-Medical): No  Physical Activity: Insufficiently Active (04/13/2024)   Received from [facility]   Exercise Vital Sign   . On average, how many days per week do you engage in moderate to strenuous exercise (like a brisk walk)?: 3 days   . On average, how many minutes do you engage in exercise at this level?: 20 min  Stress: Stress Concern Present (04/13/2024)   Received from [facility] - Occupational Stress Questionnaire   . Feeling of Stress : To some extent  Social Connections: Socially Integrated (04/13/2024)   Received from [facility]   Social Connection and Isolation Panel   . In a typical week, how many times do you talk on the phone with family, friends, or neighbors?: More than three times a week   . How often do you get together with friends or relatives?: Once a week   . How often do you attend church or religious services?: More than 4 times per year   . Do you belong  to any clubs or organizations such as church groups, unions, fraternal or athletic groups, or school groups?: Yes   . How often do you attend meetings of the clubs or organizations you belong to?: More than 4 times per year   . Are you married, widowed, divorced, separated, never married, or living with a partner?: Married   Dallara, Norleen Agent, MD 07/22/24 2352

## 2024-07-23 ENCOUNTER — Other Ambulatory Visit: Payer: Self-pay

## 2024-07-23 ENCOUNTER — Telehealth: Payer: Self-pay

## 2024-07-23 DIAGNOSIS — N200 Calculus of kidney: Secondary | ICD-10-CM

## 2024-07-23 DIAGNOSIS — R310 Gross hematuria: Secondary | ICD-10-CM

## 2024-07-23 NOTE — Telephone Encounter (Signed)
 done

## 2024-07-24 ENCOUNTER — Ambulatory Visit: Admitting: Family Medicine

## 2024-07-25 ENCOUNTER — Ambulatory Visit: Admitting: Urology

## 2024-07-25 ENCOUNTER — Encounter: Payer: Self-pay | Admitting: Urology

## 2024-07-25 VITALS — BP 127/76 | HR 84 | Ht 62.0 in | Wt 143.0 lb

## 2024-07-25 DIAGNOSIS — N3281 Overactive bladder: Secondary | ICD-10-CM | POA: Diagnosis not present

## 2024-07-25 DIAGNOSIS — N200 Calculus of kidney: Secondary | ICD-10-CM | POA: Diagnosis not present

## 2024-07-25 DIAGNOSIS — N201 Calculus of ureter: Secondary | ICD-10-CM | POA: Diagnosis not present

## 2024-07-25 DIAGNOSIS — N202 Calculus of kidney with calculus of ureter: Secondary | ICD-10-CM

## 2024-07-25 LAB — MICROSCOPIC EXAMINATION: Mucus, UA: POSITIVE — AB

## 2024-07-25 LAB — URINALYSIS, ROUTINE W REFLEX MICROSCOPIC
Bilirubin, UA: NEGATIVE
Glucose, UA: NEGATIVE
Ketones, UA: NEGATIVE
Leukocytes,UA: NEGATIVE
Nitrite, UA: NEGATIVE
Specific Gravity, UA: >=1.03 — AB (ref 1.005–1.030)
Urobilinogen, Ur: 0.2 mg/dL (ref 0.2–1.0)
pH, UA: 5.5 (ref 5.0–7.5)

## 2024-07-25 NOTE — Progress Notes (Signed)
 Assessment: 1. Ureteral calculus, left, proximal   2. Nephrolithiasis   3. OAB (overactive bladder)     Plan: I personally reviewed the CT study from 07/22/2024 with results as noted below. Options for management of the left proximal ureteral calculus discussed.  The stone does appear too large to allow spontaneous passage successfully. Options for treatment including shockwave lithotripsy or ureteroscopic laser lithotripsy discussed. Will need to discuss management of her Xarelto  prior to any procedures. I think that ureteroscopy with laser lithotripsy would be the best option as this would minimize risk of any renal hematoma and also allow treatment of the other renal calculi.  Procedure: The patient will be scheduled for cystoscopy, left retrograde pyelogram, left ureteroscopy with laser lithotripsy, insertion of left ureteral stent at [facility].  Surgical request is placed with the surgery schedulers and will be scheduled at the patient's/family request. Informed consent is given as documented below. Anesthesia: General  The patient does not have sleep apnea, history of MRSA, history of VRE, history of cardiac device requiring special anesthetic needs. Patient is stable and considered clear for surgical management in an outpatient ambulatory surgery setting as well as inpatient hospital setting.  Consent for Operation or Procedure: Provider Certification I hereby certify that the nature, purpose, benefits, usual and most frequent risks of, and alternatives to, the operation or procedure have been explained to the patient (or person authorized to sign for the patient) either by me as responsible physician or by the provider who is to perform the operation or procedure. Time spent such that the patient/family has had an opportunity to ask questions, and that those questions have been answered. The patient or the patient's representative has been advised that selected tasks may be  performed by assistants to the primary health care provider(s). I believe that the patient (or person authorized to sign for the patient) understands what has been explained, and has consented to the operation or procedure. No guarantees were implied or made.   Chief Complaint:  Chief Complaint  Patient presents with   Nephrolithiasis    History of Present Illness:  Cathy Simmons is a 58 y.o. female who is seen for evaluation of a left ureteral calculus. She presented to the emergency room at [facility] on 07/22/2024 with acute onset of left-sided flank pain.  She had radiation of the pain to her left abdomen.  She had associated nausea and vomiting.  No fevers.  She reports an episode of gross hematuria approximately 2 weeks ago. CT imaging showed an 8 mm proximal left ureteral calculus with moderate hydronephrosis. Creatinine normal at 0.99. Her pain has been well-controlled since that time.  She is not taking any pain medication.  She reports it as a dull ache.  She is not having any nausea and vomiting at the present time.  No fever.  No gross hematuria. She continues to have lower urinary tract symptoms of frequency, urgency, and urge incontinence.  She is not on any medication at this time. She is on Xarelto  for management of blood clots.     History of microscopic hematuria. U/A results:   4/21 3-6 RBC 5/21 7-10 RBC 6/21 7-10 RBC 8/22 0-5 RBC 11/22 6-10 RBC 9/24 3-6 RBC 10/24 11-20 RBC  CT imaging from 11/22 showed nonobstructive 5 mm left renal calculus. KUB from 08/10/2023 showed a cluster of calcifications in the upper pole of the left kidney, no obvious calcifications along the expected course of either ureter. No history of  gross hematuria.  No dysuria or flank pain. She has previously had a hematuria evaluation by Dr. Rosalind at [facility] in August 2021.  CT abdomen and pelvis from 07/01/2020 showed a 7 mm left renal calculus.  Cystoscopy was normal. She has a  prior history of tobacco use. CT hematuria profile from 09/30/2023 showed a 9 mm nonobstructing left lower pole renal calculus, no renal masses or obstruction, no filling defects.  She was also noted to have a 3.2 right ovarian cyst. CX bladder was normal.  She has lower urinary tract symptoms including frequency, voiding every hour, urgency, some discomfort after voiding, nocturia 2-4 times, and urge incontinence.  The symptoms have been present for some time and have worsened over the past several months.  No prior therapy for her symptoms. She was given a trial of Solifenacin  in 10/24.  She was seen in November 2024 for further evaluation of her microscopic hematuria and overactive bladder symptoms.  She had not had any gross hematuria.  She was complaining of some left sided back pain with radiation around to the left lower quadrant and into her left thigh.  She only took the Vesicare  for approximately 2 weeks.  She discontinued this due to some dry mouth as well as problems with strep throat making swallowing difficult.  She did not see any significant improvement in her symptoms with the medication.  She continued with frequency, urgency, and some discomfort with voiding.  She was given a trial of Myrbetriq  50 mg daily in 11/24.  CT abdomen and pelvis with contrast from 04/09/2024 showed multiple renal calculi on the left with the largest measuring 8 mm at the upper pole.   Portions of the above documentation were copied from a prior visit for review purposes only.    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   Depression    Diverticulitis    Family history of anesthesia complication    mother difficult to wake   GERD (gastroesophageal reflux disease)    History of blood clots    Hyperlipidemia    Osteoporosis     Past Surgical History:  Past Surgical History:   Procedure Laterality Date   BREAST BIOPSY Right 2015   benign cysts showing apocrine metaplasia   BREAST EXCISIONAL BIOPSY Left 1995   2 times left breast, benign both times   DILATION AND CURETTAGE OF UTERUS  x2 1984 and 1991   excision left ganglion cyst Left    FOREIGN BODY REMOVAL Right 01/29/2013   Procedure: EXCISION FOREIGN BODY RIGHT SMALL FINGER;  Surgeon: Arley JONELLE Curia, MD;  Location: [facility];  Service: Orthopedics;  Laterality: Right;   removal spleen  2000    Allergies:  Allergies  Allergen Reactions   Pneumococcal Vaccine Swelling    Swelling of arm   Bactrim [Sulfamethoxazole-Trimethoprim] Other (See Comments)    Sulfa causes yeast infections!   Duloxetine     Other Reaction(s): Other (See Comments)  Passed out   Adhesive [Tape] Rash and Other (See Comments)    Band-Aids   Cymbalta [Duloxetine Hcl] Anxiety    Family History:  Family History  Problem Relation Age of Onset   Stroke Mother    Cancer Mother        breast cancer   Breast cancer Mother    Diabetes Father  Parkinson's disease Father    Colon cancer Neg Hx    Rectal cancer Neg Hx    Esophageal cancer Neg Hx    Stomach cancer Neg Hx     Social History:  Social History   Tobacco Use   Smoking status: Former    Current packs/day: 0.00    Types: Cigarettes    Quit date: 01/30/1999    Years since quitting: 25.5   Smokeless tobacco: Never  Vaping Use   Vaping status: Never Used  Substance Use Topics   Alcohol use: No   Drug use: No    ROS: Constitutional:  Negative for fever, chills, weight loss CV: Negative for chest pain, previous MI, hypertension Respiratory:  Negative for shortness of breath, wheezing, sleep apnea, frequent cough GI:  Negative for nausea, vomiting, bloody stool, GERD  Physical exam: BP 127/76   Pulse 84   Ht 5' 2 (1.575 m)   Wt 143 lb (64.9 kg)   BMI 26.16 kg/m  GENERAL APPEARANCE:  Well appearing, well developed, well nourished,  NAD HEENT:  Atraumatic, normocephalic, oropharynx clear NECK:  Supple without lymphadenopathy or thyromegaly ABDOMEN:  Soft, non-tender, no masses EXTREMITIES:  Moves all extremities well, without clubbing, cyanosis, or edema NEUROLOGIC:  Alert and oriented x 3, normal gait, CN II-XII grossly intact MENTAL STATUS:  appropriate BACK:  Non-tender to palpation, No CVAT SKIN:  Warm, dry, and intact  Results: U/A: 0-5 WBCs, 11-30 RBCs

## 2024-07-25 NOTE — H&P (View-Only) (Signed)
 Assessment: 1. Ureteral calculus, left, proximal   2. Nephrolithiasis   3. OAB (overactive bladder)     Plan: I personally reviewed the CT study from 07/22/2024 with results as noted below. Options for management of the left proximal ureteral calculus discussed.  The stone does appear too large to allow spontaneous passage successfully. Options for treatment including shockwave lithotripsy or ureteroscopic laser lithotripsy discussed. Will need to discuss management of her Xarelto  prior to any procedures. I think that ureteroscopy with laser lithotripsy would be the best option as this would minimize risk of any renal hematoma and also allow treatment of the other renal calculi.  Procedure: The patient will be scheduled for cystoscopy, left retrograde pyelogram, left ureteroscopy with laser lithotripsy, insertion of left ureteral stent at [facility].  Surgical request is placed with the surgery schedulers and will be scheduled at the patient's/family request. Informed consent is given as documented below. Anesthesia: General  The patient does not have sleep apnea, history of MRSA, history of VRE, history of cardiac device requiring special anesthetic needs. Patient is stable and considered clear for surgical management in an outpatient ambulatory surgery setting as well as inpatient hospital setting.  Consent for Operation or Procedure: Provider Certification I hereby certify that the nature, purpose, benefits, usual and most frequent risks of, and alternatives to, the operation or procedure have been explained to the patient (or person authorized to sign for the patient) either by me as responsible physician or by the provider who is to perform the operation or procedure. Time spent such that the patient/family has had an opportunity to ask questions, and that those questions have been answered. The patient or the patient's representative has been advised that selected tasks may be  performed by assistants to the primary health care provider(s). I believe that the patient (or person authorized to sign for the patient) understands what has been explained, and has consented to the operation or procedure. No guarantees were implied or made.   Chief Complaint:  Chief Complaint  Patient presents with   Nephrolithiasis    History of Present Illness:  Cathy Simmons is a 58 y.o. female who is seen for evaluation of a left ureteral calculus. She presented to the emergency room at [facility] on 07/22/2024 with acute onset of left-sided flank pain.  She had radiation of the pain to her left abdomen.  She had associated nausea and vomiting.  No fevers.  She reports an episode of gross hematuria approximately 2 weeks ago. CT imaging showed an 8 mm proximal left ureteral calculus with moderate hydronephrosis. Creatinine normal at 0.99. Her pain has been well-controlled since that time.  She is not taking any pain medication.  She reports it as a dull ache.  She is not having any nausea and vomiting at the present time.  No fever.  No gross hematuria. She continues to have lower urinary tract symptoms of frequency, urgency, and urge incontinence.  She is not on any medication at this time. She is on Xarelto  for management of blood clots.     History of microscopic hematuria. U/A results:   4/21 3-6 RBC 5/21 7-10 RBC 6/21 7-10 RBC 8/22 0-5 RBC 11/22 6-10 RBC 9/24 3-6 RBC 10/24 11-20 RBC  CT imaging from 11/22 showed nonobstructive 5 mm left renal calculus. KUB from 08/10/2023 showed a cluster of calcifications in the upper pole of the left kidney, no obvious calcifications along the expected course of either ureter. No history of  gross hematuria.  No dysuria or flank pain. She has previously had a hematuria evaluation by Dr. Rosalind at [facility] in August 2021.  CT abdomen and pelvis from 07/01/2020 showed a 7 mm left renal calculus.  Cystoscopy was normal. She has a  prior history of tobacco use. CT hematuria profile from 09/30/2023 showed a 9 mm nonobstructing left lower pole renal calculus, no renal masses or obstruction, no filling defects.  She was also noted to have a 3.2 right ovarian cyst. CX bladder was normal.  She has lower urinary tract symptoms including frequency, voiding every hour, urgency, some discomfort after voiding, nocturia 2-4 times, and urge incontinence.  The symptoms have been present for some time and have worsened over the past several months.  No prior therapy for her symptoms. She was given a trial of Solifenacin  in 10/24.  She was seen in November 2024 for further evaluation of her microscopic hematuria and overactive bladder symptoms.  She had not had any gross hematuria.  She was complaining of some left sided back pain with radiation around to the left lower quadrant and into her left thigh.  She only took the Vesicare  for approximately 2 weeks.  She discontinued this due to some dry mouth as well as problems with strep throat making swallowing difficult.  She did not see any significant improvement in her symptoms with the medication.  She continued with frequency, urgency, and some discomfort with voiding.  She was given a trial of Myrbetriq  50 mg daily in 11/24.  CT abdomen and pelvis with contrast from 04/09/2024 showed multiple renal calculi on the left with the largest measuring 8 mm at the upper pole.   Portions of the above documentation were copied from a prior visit for review purposes only.    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   Depression    Diverticulitis    Family history of anesthesia complication    mother difficult to wake   GERD (gastroesophageal reflux disease)    History of blood clots    Hyperlipidemia    Osteoporosis     Past Surgical History:  Past Surgical History:   Procedure Laterality Date   BREAST BIOPSY Right 2015   benign cysts showing apocrine metaplasia   BREAST EXCISIONAL BIOPSY Left 1995   2 times left breast, benign both times   DILATION AND CURETTAGE OF UTERUS  x2 1984 and 1991   excision left ganglion cyst Left    FOREIGN BODY REMOVAL Right 01/29/2013   Procedure: EXCISION FOREIGN BODY RIGHT SMALL FINGER;  Surgeon: Arley JONELLE Curia, MD;  Location: [facility];  Service: Orthopedics;  Laterality: Right;   removal spleen  2000    Allergies:  Allergies  Allergen Reactions   Pneumococcal Vaccine Swelling    Swelling of arm   Bactrim [Sulfamethoxazole-Trimethoprim] Other (See Comments)    Sulfa causes yeast infections!   Duloxetine     Other Reaction(s): Other (See Comments)  Passed out   Adhesive [Tape] Rash and Other (See Comments)    Band-Aids   Cymbalta [Duloxetine Hcl] Anxiety    Family History:  Family History  Problem Relation Age of Onset   Stroke Mother    Cancer Mother        breast cancer   Breast cancer Mother    Diabetes Father  Parkinson's disease Father    Colon cancer Neg Hx    Rectal cancer Neg Hx    Esophageal cancer Neg Hx    Stomach cancer Neg Hx     Social History:  Social History   Tobacco Use   Smoking status: Former    Current packs/day: 0.00    Types: Cigarettes    Quit date: 01/30/1999    Years since quitting: 25.5   Smokeless tobacco: Never  Vaping Use   Vaping status: Never Used  Substance Use Topics   Alcohol use: No   Drug use: No    ROS: Constitutional:  Negative for fever, chills, weight loss CV: Negative for chest pain, previous MI, hypertension Respiratory:  Negative for shortness of breath, wheezing, sleep apnea, frequent cough GI:  Negative for nausea, vomiting, bloody stool, GERD  Physical exam: BP 127/76   Pulse 84   Ht 5' 2 (1.575 m)   Wt 143 lb (64.9 kg)   BMI 26.16 kg/m  GENERAL APPEARANCE:  Well appearing, well developed, well nourished,  NAD HEENT:  Atraumatic, normocephalic, oropharynx clear NECK:  Supple without lymphadenopathy or thyromegaly ABDOMEN:  Soft, non-tender, no masses EXTREMITIES:  Moves all extremities well, without clubbing, cyanosis, or edema NEUROLOGIC:  Alert and oriented x 3, normal gait, CN II-XII grossly intact MENTAL STATUS:  appropriate BACK:  Non-tender to palpation, No CVAT SKIN:  Warm, dry, and intact  Results: U/A: 0-5 WBCs, 11-30 RBCs

## 2024-07-27 ENCOUNTER — Ambulatory Visit: Payer: Self-pay | Admitting: Urology

## 2024-07-27 DIAGNOSIS — N201 Calculus of ureter: Secondary | ICD-10-CM

## 2024-07-27 NOTE — Progress Notes (Signed)
 Sent message, via epic in basket, requesting orders in epic from Careers adviser.

## 2024-08-02 ENCOUNTER — Telehealth: Payer: Self-pay | Admitting: Urology

## 2024-08-02 NOTE — Telephone Encounter (Signed)
 Pt called about a form that was supposed to be sent by CHARON Givens to help with her disability. I confirmed the fax number with the patient.Have you seen this form I looked in her media and did not see it? Please Advise.

## 2024-08-04 NOTE — Patient Instructions (Signed)
 SURGICAL WAITING ROOM VISITATION Patients having surgery or a procedure may have no more than 2 support people in the waiting area - these visitors may rotate in the visitor waiting room.   If the patient needs to stay at the hospital during part of their recovery, the visitor guidelines for inpatient rooms apply.  PRE-OP VISITATION  Pre-op nurse will coordinate an appropriate time for 1 support person to accompany the patient in pre-op.  This support person may not rotate.  This visitor will be contacted when the time is appropriate for the visitor to come back in the pre-op area.  Please refer to the [facility] website for the visitor guidelines for Inpatients (after your surgery is over and you are in a regular room).  You are not required to quarantine at this time prior to your surgery. However, you must do this: Hand Hygiene often Do NOT share personal items Notify your provider if you are in close contact with someone who has COVID or you develop fever 100.4 or greater, new onset of sneezing, cough, sore throat, shortness of breath or body aches.  If you test positive for Covid or have been in contact with anyone that has tested positive in the last 10 days please notify you surgeon.    Your procedure is scheduled on:  Tuesday  August 07, 2024  Report to [facility] Main Entrance: [address] entrance where the [employer] is available.   Report to admitting at: 1:00 PM  DO NOT EAT ANYTHING AFTER MIDNIGHT THE NIGHT PRIOR TO YOUR SURGERY / PROCEDURE.  After Midnight you may have the following liquids until 08:00 AM DAY OF SURGERY  Clear Liquid Diet Water Black Coffee (sugar ok, NO MILK/CREAM OR CREAMERS)  Tea (sugar ok, NO MILK/CREAM OR CREAMERS) regular and decaf                             Plain Jell-O  with no fruit (NO RED)                                           Fruit ices (not with fruit pulp, NO RED)                                     Popsicles (NO RED)                                                                   Juice: NO CITRUS JUICES: only apple, WHITE grape, WHITE cranberry Sports drinks like Gatorade or Powerade (NO RED)  After 08:00 AM, nothing to eat OR drink.  Call this number if you have any questions or problems the morning of surgery [phone]  FOLLOW  ANY ADDITIONAL PRE OP INSTRUCTIONS YOU RECEIVED FROM YOUR SURGEON'S OFFICE!!!   Oral Hygiene is also important to reduce your risk of infection.        Remember - BRUSH YOUR TEETH THE MORNING OF SURGERY WITH YOUR REGULAR TOOTHPASTE  Do NOT smoke after Midnight the night before  surgery.  STOP TAKING all Vitamins, Herbs and supplements 1 week before your surgery.   Take ONLY these medicines the morning of surgery with A SIP OF WATER: None but you may take Tylenol  OR Oxycodone / APAP if needed for pain. You may also take clonazepam  if needed.                   You may not have any metal on your body including hair pins, jewelry, and body piercing  Do not wear make-up, lotions, powders, perfumes or deodorant  Do not wear nail polish including gel and S&S, artificial / acrylic nails, or any other type of covering on natural nails including finger and toenails. If you have artificial nails, gel coating, etc., that needs to be removed by a nail salon, Please have this removed prior to surgery. Not doing so may mean that your surgery could be cancelled or delayed if the Surgeon or anesthesia staff feels like they are unable to monitor you safely.   Do not shave 48 hours prior to surgery to avoid nicks in your skin which may contribute to postoperative infections.   Contacts, Hearing Aids, dentures or bridgework may not be worn into surgery. DENTURES WILL BE REMOVED PRIOR TO SURGERY PLEASE DO NOT APPLY Poly grip OR ADHESIVES!!!  Patients discharged on the day of surgery will not be allowed to drive home.  Someone NEEDS to stay with you for the first 24 hours after anesthesia.  Do  not bring your home medications to the hospital. The Pharmacy will dispense medications listed on your medication list to you during your admission in the Hospital.  Special Instructions: Bring a copy of your healthcare power of attorney and living will documents the day of surgery, if you wish to have them scanned into your [facility] Records- EPIC  Please read over the following fact sheets you were given: IF YOU HAVE QUESTIONS ABOUT YOUR PRE-OP INSTRUCTIONS, PLEASE CALL [phone].   [place] - Preparing for Surgery Before surgery, you can play an important role.  Because skin is not sterile, your skin needs to be as free of germs as possible.  You can reduce the number of germs on your skin by washing with Antibacterial soap before surgery.  . Do not shave (including legs and underarms) for at least 48 hours prior to the first shower.  You may shave your face/neck.  Please follow these instructions carefully:  1.  Shower with antibacterial Soap the night before surgery and the  morning of surgery.  2.  If you choose to wash your hair, wash your hair first as usual with your normal  shampoo.  3.  After you shampoo, rinse your hair and body thoroughly to remove the shampoo.                             4.  You can apply soap directly to the skin and wash.  Gently with a scrungie or clean washcloth.  5.  Wash face,  Genitals (private parts) with your normal soap.             6.  Wash thoroughly, paying special attention to the area where your  surgery  will be performed.  7.  Thoroughly rinse your body with warm water from the neck down.  8.   Pat yourself dry with a clean towel.  9  Wear clean pajamas.            10 Place clean sheets on your bed the night of your first shower and do not  sleep with pets.  ON THE DAY OF SURGERY : Do not apply any lotions/deodorants the morning of surgery.  Please wear clean clothes to the hospital/surgery center.  FAILURE TO FOLLOW  THESE INSTRUCTIONS MAY RESULT IN THE CANCELLATION OF YOUR SURGERY  PATIENT SIGNATURE_________________________________  NURSE SIGNATURE__________________________________

## 2024-08-04 NOTE — Progress Notes (Signed)
 The patient was identified using 2 approved identifiers. All issues noted in this document were discussed and addressed, Ms Cathy Simmons voiced understanding and agreement with all preoperative instructions. The patient was emailed the surgery instructions per her request.    [email or web address]  Medication Reconciliation: 08-01-24  The patient was instructed to call our  Admitting Office [phone] or [phone]) to complete their Pre-surgical Interview.    COVID Vaccine received:  []  No [x]  Yes Date of any COVID positive Test in last 90 days:  PCP - Mabel Pry, DO Cardiologist - none  Chest x-ray - 07-22-2024  1v  CE EKG - 08-08-2023 Epic    repeat DOS? Stress Test -  ECHO -  Cardiac Cath -  CT Coronary Calcium  score:   Bowel Prep - [x]  No  []   Yes ______  Pacemaker / ICD device [x]  No []  Yes   Spinal Cord Stimulator:[x]  No []  Yes       History of Sleep Apnea? [x]  No []  Yes   CPAP used?- [x]  No []  Yes    Patient has: [x]  NO Hx DM   []  Pre-DM   []  DM1  []   DM2 Does the patient monitor blood sugar?   [x]  N/A   []  No []  Yes   Blood Thinner / Instructions: XARELTO   at supper, Hold x 48 hrs. Last dose: Saturday 08-04-24 Aspirin Instructions:  none  ERAS Protocol Ordered: [x]  No  []  Yes Patient is to be NPO after: MN Prior  Dental hx: []  Dentures:  []  N/A      []  Bridge or Partial:                   []  Loose or Damaged teeth:   Comments: Patient seen at [facility] on 07-22-24 and had Blood work; these results will be used for this surgery on 08-07-24  Activity level: Able to walk up 2 flights of stairs without becoming significantly short of breath or having chest pain?  []  No   []    Yes  Patient can perform ADLs without assistance. []  No   []   Yes  Anesthesia review: chronic cerebral venous thrombosis, s/p splenectomy 2000, TMJ, GAD, MDD, GERD,   Patient denies any S&S of respiratory illness or Covid - no shortness of breath, fever, cough or chest pain at PAT  appointment.  Patient verbalized understanding and agreement to the Pre-Surgical Instructions that were given to them at this PAT appointment. Patient was also educated of the need to review these PAT instructions again prior to her surgery.I reviewed the appropriate phone numbers to call if they have any and questions or concerns.

## 2024-08-06 ENCOUNTER — Telehealth: Payer: Self-pay | Admitting: Urology

## 2024-08-06 ENCOUNTER — Encounter (HOSPITAL_COMMUNITY): Payer: Self-pay | Admitting: *Deleted

## 2024-08-06 ENCOUNTER — Other Ambulatory Visit: Payer: Self-pay

## 2024-08-06 ENCOUNTER — Encounter (HOSPITAL_COMMUNITY)
Admission: RE | Admit: 2024-08-06 | Discharge: 2024-08-06 | Disposition: A | Discharge status: Home / Self Care | Source: Ambulatory Visit | Attending: Urology | Admitting: Urology

## 2024-08-06 VITALS — BP 116/48 | HR 72 | Temp 97.9°F | Resp 16 | Ht 62.0 in | Wt 144.0 lb

## 2024-08-06 DIAGNOSIS — R351 Nocturia: Secondary | ICD-10-CM | POA: Diagnosis not present

## 2024-08-06 DIAGNOSIS — Z7901 Long term (current) use of anticoagulants: Secondary | ICD-10-CM | POA: Insufficient documentation

## 2024-08-06 DIAGNOSIS — N132 Hydronephrosis with renal and ureteral calculous obstruction: Secondary | ICD-10-CM | POA: Diagnosis not present

## 2024-08-06 DIAGNOSIS — F419 Anxiety disorder, unspecified: Secondary | ICD-10-CM | POA: Diagnosis not present

## 2024-08-06 DIAGNOSIS — Z87891 Personal history of nicotine dependence: Secondary | ICD-10-CM | POA: Diagnosis not present

## 2024-08-06 DIAGNOSIS — G08 Intracranial and intraspinal phlebitis and thrombophlebitis: Secondary | ICD-10-CM | POA: Insufficient documentation

## 2024-08-06 DIAGNOSIS — D759 Disease of blood and blood-forming organs, unspecified: Secondary | ICD-10-CM | POA: Diagnosis not present

## 2024-08-06 DIAGNOSIS — F32A Depression, unspecified: Secondary | ICD-10-CM | POA: Diagnosis not present

## 2024-08-06 DIAGNOSIS — N3941 Urge incontinence: Secondary | ICD-10-CM | POA: Diagnosis not present

## 2024-08-06 DIAGNOSIS — N201 Calculus of ureter: Secondary | ICD-10-CM | POA: Insufficient documentation

## 2024-08-06 DIAGNOSIS — N3281 Overactive bladder: Secondary | ICD-10-CM | POA: Diagnosis not present

## 2024-08-06 DIAGNOSIS — Z86718 Personal history of other venous thrombosis and embolism: Secondary | ICD-10-CM | POA: Insufficient documentation

## 2024-08-06 DIAGNOSIS — K219 Gastro-esophageal reflux disease without esophagitis: Secondary | ICD-10-CM | POA: Insufficient documentation

## 2024-08-06 DIAGNOSIS — Z01818 Encounter for other preprocedural examination: Secondary | ICD-10-CM | POA: Insufficient documentation

## 2024-08-06 DIAGNOSIS — E785 Hyperlipidemia, unspecified: Secondary | ICD-10-CM | POA: Diagnosis not present

## 2024-08-06 DIAGNOSIS — R350 Frequency of micturition: Secondary | ICD-10-CM | POA: Diagnosis not present

## 2024-08-06 DIAGNOSIS — I10 Essential (primary) hypertension: Secondary | ICD-10-CM | POA: Insufficient documentation

## 2024-08-06 DIAGNOSIS — M1990 Unspecified osteoarthritis, unspecified site: Secondary | ICD-10-CM | POA: Diagnosis not present

## 2024-08-06 HISTORY — DX: Chronic kidney disease, unspecified: N18.9

## 2024-08-06 HISTORY — DX: Unspecified osteoarthritis, unspecified site: M19.90

## 2024-08-06 HISTORY — DX: Personal history of urinary calculi: Z87.442

## 2024-08-06 NOTE — Anesthesia Preprocedure Evaluation (Signed)
 Anesthesia Evaluation  Patient identified by MRN, date of birth, ID band Patient awake    Reviewed: Allergy & Precautions, NPO status , Patient's Chart, lab work & pertinent test results  Airway Mallampati: III  TM Distance: >3 FB Neck ROM: Full    Dental  (+) Dental Advisory Given, Chipped,    Pulmonary former smoker   Pulmonary exam normal breath sounds clear to auscultation       Cardiovascular negative cardio ROS Normal cardiovascular exam Rhythm:Regular Rate:Normal     Neuro/Psych  PSYCHIATRIC DISORDERS Anxiety Depression    negative neurological ROS     GI/Hepatic Neg liver ROS,GERD  ,,  Endo/Other  negative endocrine ROS    Renal/GU Renal InsufficiencyRenal disease  negative genitourinary   Musculoskeletal  (+) Arthritis ,    Abdominal   Peds  Hematology  (+) Blood dyscrasia (xarelto )   Anesthesia Other Findings PMH of former smoking, sinus venous thrombosis on Xarelto , GERD, anxiety, depression, arthritis  Reproductive/Obstetrics                              Anesthesia Physical Anesthesia Plan  ASA: 2  Anesthesia Plan: General   Post-op Pain Management: Tylenol  PO (pre-op)*   Induction: Intravenous  PONV Risk Score and Plan: 3 and Ondansetron , Dexamethasone  and Midazolam   Airway Management Planned: LMA  Additional Equipment:   Intra-op Plan:   Post-operative Plan: Extubation in OR  Informed Consent: I have reviewed the patients History and Physical, chart, labs and discussed the procedure including the risks, benefits and alternatives for the proposed anesthesia with the patient or authorized representative who has indicated his/her understanding and acceptance.     Dental advisory given  Plan Discussed with: CRNA  Anesthesia Plan Comments: (See PAT note from 9/8)         Anesthesia Quick Evaluation

## 2024-08-06 NOTE — Telephone Encounter (Signed)
 Wants to know if we received disability papers from [employer] as well as whether they have been filled out and faxed back.

## 2024-08-06 NOTE — Progress Notes (Signed)
 Case: [id number] Date/Time: 08/07/24 1500   Procedure: CYSTOSCOPY/URETEROSCOPY/HOLMIUM LASER/STENT PLACEMENT (Left)   Anesthesia type: General   Diagnosis: Calculus of ureter [N20.1]   Pre-op diagnosis: Calculus of ureter   Location: [facility] PROCEDURE [facility] / [facility]   Surgeons: Roseann Adine PARAS., MD       DISCUSSION: Cathy Simmons is a 58 yo female with PMH of former smoking, sinus venous thrombosis on Xarelto , GERD, anxiety, depression, arthritis  Patient with hx of sinus venous thrombosis in 2016. She has been recommended by Hematology to be on lifelong anticoagulation and takes Xarelto . AC is managed by PCP. Per Dr. Frann (PCP) ok to hold Xarelto  for 2 days (clearance scanned into media on 8/28)  LD Xarelto : 9/6  VS: BP (!) 116/48 Comment: right arm sitting  Pulse 72   Temp 36.6 C (Oral)   Resp 16   Ht 5' 2 (1.575 m)   Wt 65.3 kg   LMP 08/29/2018   SpO2 98%   BMI 26.34 kg/m   PROVIDERS: Frann Mabel Mt, DO   LABS: Labs reviewed: Acceptable for surgery. (all labs ordered are listed, but only abnormal results are displayed)  Labs Reviewed - No data to display   IMAGES:  CXR 07/22/24 [facility]):  Findings:   No airspace consolidation or focal infiltrate. There is no visible effusion. Mediastinum is unremarkable. Pulmonary vasculature appears normal.   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   Depression    Diverticulitis    Family history of anesthesia complication    mother difficult to wake   GERD (gastroesophageal reflux disease)    History of blood clots    History of kidney stones    Hyperlipidemia    Osteoporosis    Pneumonia     Past Surgical History:  Procedure Laterality Date   BREAST BIOPSY Right 2015   benign cysts showing apocrine metaplasia   BREAST  EXCISIONAL BIOPSY Left 1995   2 times left breast, benign both times   DILATION AND CURETTAGE OF UTERUS  x2 1984 and 1991   excision left ganglion cyst Left    FOREIGN BODY REMOVAL Right 01/29/2013   Procedure: EXCISION FOREIGN BODY RIGHT SMALL FINGER;  Surgeon: Arley JONELLE Curia, MD;  Location: [facility];  Service: Orthopedics;  Laterality: Right;   SPLENECTOMY  2000   d/t calcified cyst from injury    MEDICATIONS:  acetaminophen  (TYLENOL ) 650 MG CR tablet   Ascorbic Acid (SUPER C COMPLEX PO)   cetirizine (ZYRTEC) 10 MG tablet   clonazePAM  (KLONOPIN ) 0.5 MG tablet   Cyanocobalamin (VITAMIN B-12 PO)   esomeprazole (NEXIUM) 20 MG capsule   oxyCODONE -acetaminophen  (PERCOCET/ROXICET) 5-325 MG tablet   promethazine  (PHENERGAN ) 25 MG tablet   rosuvastatin  (CRESTOR ) 20 MG tablet   tamsulosin  (FLOMAX ) 0.4 MG CAPS capsule   XARELTO  20 MG TABS tablet   No current facility-administered medications for this encounter.   Burnard CHRISTELLA Odis DEVONNA [facility] Short Stay/Anesthesiology [facility] Phone [phone] 08/06/2024 11:07 AM

## 2024-08-07 ENCOUNTER — Ambulatory Visit (HOSPITAL_COMMUNITY): Admitting: Anesthesiology

## 2024-08-07 ENCOUNTER — Encounter (HOSPITAL_COMMUNITY): Payer: Self-pay | Admitting: Urology

## 2024-08-07 ENCOUNTER — Other Ambulatory Visit: Payer: Self-pay

## 2024-08-07 ENCOUNTER — Ambulatory Visit (HOSPITAL_COMMUNITY): Payer: Self-pay | Admitting: Medical

## 2024-08-07 ENCOUNTER — Ambulatory Visit (HOSPITAL_COMMUNITY)
Admission: RE | Admit: 2024-08-07 | Discharge: 2024-08-07 | Disposition: A | Discharge status: Home / Self Care | Attending: Urology | Admitting: Urology

## 2024-08-07 ENCOUNTER — Ambulatory Visit (HOSPITAL_COMMUNITY)

## 2024-08-07 ENCOUNTER — Encounter (HOSPITAL_COMMUNITY)
Admission: RE | Disposition: A | Discharge status: Home / Self Care | Payer: Self-pay | Source: Home / Self Care | Attending: Urology

## 2024-08-07 DIAGNOSIS — N202 Calculus of kidney with calculus of ureter: Secondary | ICD-10-CM | POA: Diagnosis not present

## 2024-08-07 DIAGNOSIS — N3281 Overactive bladder: Secondary | ICD-10-CM | POA: Insufficient documentation

## 2024-08-07 DIAGNOSIS — N132 Hydronephrosis with renal and ureteral calculous obstruction: Secondary | ICD-10-CM | POA: Insufficient documentation

## 2024-08-07 DIAGNOSIS — Z7901 Long term (current) use of anticoagulants: Secondary | ICD-10-CM | POA: Insufficient documentation

## 2024-08-07 DIAGNOSIS — N200 Calculus of kidney: Secondary | ICD-10-CM

## 2024-08-07 DIAGNOSIS — Z87891 Personal history of nicotine dependence: Secondary | ICD-10-CM | POA: Insufficient documentation

## 2024-08-07 DIAGNOSIS — N3941 Urge incontinence: Secondary | ICD-10-CM | POA: Insufficient documentation

## 2024-08-07 DIAGNOSIS — N201 Calculus of ureter: Secondary | ICD-10-CM

## 2024-08-07 DIAGNOSIS — E785 Hyperlipidemia, unspecified: Secondary | ICD-10-CM | POA: Insufficient documentation

## 2024-08-07 DIAGNOSIS — M1990 Unspecified osteoarthritis, unspecified site: Secondary | ICD-10-CM | POA: Insufficient documentation

## 2024-08-07 DIAGNOSIS — R350 Frequency of micturition: Secondary | ICD-10-CM | POA: Insufficient documentation

## 2024-08-07 DIAGNOSIS — D759 Disease of blood and blood-forming organs, unspecified: Secondary | ICD-10-CM | POA: Insufficient documentation

## 2024-08-07 DIAGNOSIS — F32A Depression, unspecified: Secondary | ICD-10-CM | POA: Insufficient documentation

## 2024-08-07 DIAGNOSIS — F419 Anxiety disorder, unspecified: Secondary | ICD-10-CM | POA: Insufficient documentation

## 2024-08-07 DIAGNOSIS — R351 Nocturia: Secondary | ICD-10-CM | POA: Insufficient documentation

## 2024-08-07 DIAGNOSIS — K219 Gastro-esophageal reflux disease without esophagitis: Secondary | ICD-10-CM | POA: Insufficient documentation

## 2024-08-07 HISTORY — PX: CYSTOSCOPY/URETEROSCOPY/HOLMIUM LASER/STENT PLACEMENT: SHX6546

## 2024-08-07 SURGERY — CYSTOSCOPY/URETEROSCOPY/HOLMIUM LASER/STENT PLACEMENT
Anesthesia: General | Site: Ureter | Laterality: Left

## 2024-08-07 MED ORDER — LACTATED RINGERS IV SOLN
INTRAVENOUS | Status: DC
Start: 2024-08-07 — End: 2024-08-07

## 2024-08-07 MED ORDER — IOHEXOL 300 MG/ML  SOLN
INTRAMUSCULAR | Status: DC | PRN
  Administered 2024-08-07: 4 mL

## 2024-08-07 MED ORDER — ONDANSETRON HCL 4 MG/2ML IJ SOLN
INTRAMUSCULAR | Status: AC
  Filled 2024-08-07: qty 2

## 2024-08-07 MED ORDER — SODIUM CHLORIDE 0.9 % IV SOLN
1.0000 g | INTRAVENOUS | Status: AC
  Administered 2024-08-07: 2 g via INTRAVENOUS
  Filled 2024-08-07: qty 10

## 2024-08-07 MED ORDER — PHENYLEPHRINE 80 MCG/ML (10ML) SYRINGE FOR IV PUSH (FOR BLOOD PRESSURE SUPPORT)
PREFILLED_SYRINGE | INTRAVENOUS | Status: DC | PRN
  Administered 2024-08-07: 80 ug via INTRAVENOUS
  Administered 2024-08-07: 160 ug via INTRAVENOUS
  Administered 2024-08-07: 80 ug via INTRAVENOUS
  Administered 2024-08-07: 160 ug via INTRAVENOUS

## 2024-08-07 MED ORDER — TAMSULOSIN HCL 0.4 MG PO CAPS: 0.4000 mg | ORAL_CAPSULE | Freq: Every day | ORAL | 1 refills | Status: DC

## 2024-08-07 MED ORDER — DEXAMETHASONE SODIUM PHOSPHATE 10 MG/ML IJ SOLN
INTRAMUSCULAR | Status: AC
  Filled 2024-08-07: qty 1

## 2024-08-07 MED ORDER — SODIUM CHLORIDE 0.9 % IR SOLN
Status: DC | PRN
  Administered 2024-08-07: 3000 mL

## 2024-08-07 MED ORDER — PROPOFOL 10 MG/ML IV BOLUS
INTRAVENOUS | Status: DC | PRN
  Administered 2024-08-07: 200 mg via INTRAVENOUS

## 2024-08-07 MED ORDER — MIDAZOLAM HCL 5 MG/5ML IJ SOLN
INTRAMUSCULAR | Status: DC | PRN
  Administered 2024-08-07: 2 mg via INTRAVENOUS

## 2024-08-07 MED ORDER — LIDOCAINE HCL (PF) 2 % IJ SOLN
INTRAMUSCULAR | Status: AC
  Filled 2024-08-07: qty 5

## 2024-08-07 MED ORDER — ORAL CARE MOUTH RINSE: 15.0000 mL | Freq: Once | OROMUCOSAL | Status: AC

## 2024-08-07 MED ORDER — FENTANYL CITRATE PF 50 MCG/ML IJ SOSY: 25.0000 ug | PREFILLED_SYRINGE | INTRAMUSCULAR | Status: DC | PRN

## 2024-08-07 MED ORDER — ONDANSETRON HCL 4 MG/2ML IJ SOLN
INTRAMUSCULAR | Status: DC | PRN
Start: 2024-08-07 — End: 2024-08-07
  Administered 2024-08-07: 4 mg via INTRAVENOUS

## 2024-08-07 MED ORDER — OXYCODONE-ACETAMINOPHEN 5-325 MG PO TABS: 1.0000 | ORAL_TABLET | Freq: Four times a day (QID) | ORAL | 0 refills | Status: DC | PRN

## 2024-08-07 MED ORDER — FENTANYL CITRATE (PF) 100 MCG/2ML IJ SOLN
INTRAMUSCULAR | Status: DC | PRN
  Administered 2024-08-07 (×2): 50 ug via INTRAVENOUS

## 2024-08-07 MED ORDER — MIDAZOLAM HCL 2 MG/2ML IJ SOLN
INTRAMUSCULAR | Status: AC
  Filled 2024-08-07: qty 2

## 2024-08-07 MED ORDER — AMISULPRIDE (ANTIEMETIC) 5 MG/2ML IV SOLN: 10.0000 mg | Freq: Once | INTRAVENOUS | Status: DC | PRN

## 2024-08-07 MED ORDER — LIDOCAINE HCL (PF) 2 % IJ SOLN
INTRAMUSCULAR | Status: DC | PRN
  Administered 2024-08-07: 100 mg via INTRADERMAL

## 2024-08-07 MED ORDER — DEXAMETHASONE SODIUM PHOSPHATE 10 MG/ML IJ SOLN
INTRAMUSCULAR | Status: DC | PRN
  Administered 2024-08-07: 10 mg via INTRAVENOUS

## 2024-08-07 MED ORDER — LIDOCAINE HCL URETHRAL/MUCOSAL 2 % EX GEL
CUTANEOUS | Status: AC
  Filled 2024-08-07: qty 30

## 2024-08-07 MED ORDER — CHLORHEXIDINE GLUCONATE 0.12 % MT SOLN
15.0000 mL | Freq: Once | OROMUCOSAL | Status: AC
  Administered 2024-08-07: 15 mL via OROMUCOSAL

## 2024-08-07 MED ORDER — OXYCODONE HCL 5 MG PO TABS: 5.0000 mg | ORAL_TABLET | Freq: Once | ORAL | Status: DC | PRN

## 2024-08-07 MED ORDER — PHENAZOPYRIDINE HCL 200 MG PO TABS: 200.0000 mg | ORAL_TABLET | Freq: Three times a day (TID) | ORAL | 0 refills | Status: DC | PRN

## 2024-08-07 MED ORDER — ACETAMINOPHEN 500 MG PO TABS
1000.0000 mg | ORAL_TABLET | Freq: Once | ORAL | Status: AC
  Administered 2024-08-07: 1000 mg via ORAL
  Filled 2024-08-07: qty 2

## 2024-08-07 MED ORDER — FENTANYL CITRATE (PF) 100 MCG/2ML IJ SOLN
INTRAMUSCULAR | Status: AC
  Filled 2024-08-07: qty 2

## 2024-08-07 MED ORDER — NITROFURANTOIN MONOHYD MACRO 100 MG PO CAPS: 100.0000 mg | ORAL_CAPSULE | Freq: Every day | ORAL | 0 refills | Status: DC

## 2024-08-07 MED ORDER — OXYCODONE HCL 5 MG/5ML PO SOLN: 5.0000 mg | Freq: Once | ORAL | Status: DC | PRN

## 2024-08-07 SURGICAL SUPPLY — 19 items
BAG COUNTER SPONGE SURGICOUNT (BAG) IMPLANT
BAG URO CATCHER STRL LF (MISCELLANEOUS) ×1 IMPLANT
BASKET ZERO TIP NITINOL 2.4FR (BASKET) IMPLANT
CATH URETL OPEN END 6FR 70 (CATHETERS) IMPLANT
CLOTH BEACON ORANGE TIMEOUT ST (SAFETY) ×1 IMPLANT
GLOVE SURG LX STRL 8.0 MICRO (GLOVE) ×1 IMPLANT
GOWN STRL REUS W/ TWL XL LVL3 (GOWN DISPOSABLE) ×1 IMPLANT
GUIDEWIRE STR DUAL SENSOR (WIRE) ×1 IMPLANT
GUIDEWIRE ZIPWRE .038 STRAIGHT (WIRE) IMPLANT
IV NS 1000ML BAXH (IV SOLUTION) ×1 IMPLANT
KIT TURNOVER KIT A (KITS) ×1 IMPLANT
MANIFOLD NEPTUNE II (INSTRUMENTS) ×1 IMPLANT
PACK CYSTO (CUSTOM PROCEDURE TRAY) ×1 IMPLANT
SHEATH NAVIGATOR HD 11/13X28 (SHEATH) IMPLANT
SHEATH NAVIGATOR HD 12/14X36 (SHEATH) IMPLANT
STENT URET 6FRX24 CONTOUR (STENTS) IMPLANT
TRACTIP FLEXIVA PULS ID 200XHI (Laser) IMPLANT
TUBING CONNECTING 10 (TUBING) ×1 IMPLANT
TUBING UROLOGY SET (TUBING) ×1 IMPLANT

## 2024-08-07 NOTE — Anesthesia Procedure Notes (Signed)
 Procedure Name: LMA Insertion Date/Time: 08/07/2024 2:52 PM  Performed by: Carleton Garnette SAUNDERS, CRNAPre-anesthesia Checklist: Patient identified, Emergency Drugs available, Suction available, Patient being monitored and Timeout performed Patient Re-evaluated:Patient Re-evaluated prior to induction Oxygen Delivery Method: Circle system utilized Preoxygenation: Pre-oxygenation with 100% oxygen Induction Type: IV induction LMA: LMA inserted LMA Size: 4.0 Tube type: Oral Number of attempts: 1 Placement Confirmation: positive ETCO2 and breath sounds checked- equal and bilateral Tube secured with: Tape Dental Injury: Teeth and Oropharynx as per pre-operative assessment

## 2024-08-07 NOTE — Interval H&P Note (Signed)
 History and Physical Interval Note:  08/07/2024 2:31 PM  Cathy Simmons  has presented today for surgery, with the diagnosis of Calculus of ureter.  The various methods of treatment have been discussed with the patient and family. After consideration of risks, benefits and other options for treatment, the patient has consented to  Procedure(s): CYSTOSCOPY/URETEROSCOPY/HOLMIUM LASER/STENT PLACEMENT (Left) as a surgical intervention.  The patient's history has been reviewed, patient examined, no change in status, stable for surgery.  I have reviewed the patient's chart and labs.  Questions were answered to the patient's satisfaction.     Adine Manly

## 2024-08-07 NOTE — Telephone Encounter (Signed)
 Spoke with pt in reference to [facility] and STD forms. Made aware all forms were faxed yesterday. Pt voiced understanding.

## 2024-08-07 NOTE — Transfer of Care (Signed)
 Immediate Anesthesia Transfer of Care Note  Patient: Cathy Simmons  Procedure(s) Performed: CYSTOSCOPY/URETEROSCOPY/HOLMIUM LASER/STENT PLACEMENT/RETROGRADE PYELOGRAM (Left: Ureter)  Patient Location: PACU  Anesthesia Type:General  Level of Consciousness: sedated  Airway & Oxygen Therapy: Patient Spontanous Breathing and Patient connected to face mask oxygen  Post-op Assessment: Report given to RN and Post -op Vital signs reviewed and stable  Post vital signs: Reviewed and stable  Last Vitals:  Vitals Value Taken Time  BP 108/57 08/07/24 15:50  Temp    Pulse 76 08/07/24 15:51  Resp 13 08/07/24 15:51  SpO2 99 % 08/07/24 15:51  Vitals shown include unfiled device data.  Last Pain:  Vitals:   08/07/24 1253  TempSrc: Oral  PainSc:          Complications: No notable events documented.

## 2024-08-07 NOTE — Op Note (Signed)
 OPERATIVE NOTE   Patient Name: Cathy Simmons  MRN: [id number]   Date of Procedure: 08/07/24    Preoperative diagnosis:  Left ureteral calculus Left nephrolithiasis  Postoperative diagnosis:  Left ureteral calculus Left nephrolithiasis  Procedure:  Cystoscopy Left retrograde pyelogram with intraoperative interpretation Left ureteroscopy with Holmium laser lithotripsy Insertion of left ureteral stent (84F x 24 cm, tether removed)  Attending: Adine DOROTHA Manly, MD  Anesthesia: General  Estimated blood loss: 5 ml  Fluids: Per anesthesia record  Drains: 84F x 24 cm ureteral stent on left (tether removed)  Specimens: None  Antibiotics: Rocephin  1 gm IV  Findings:  - Normal bladder and urethra - 8 mm calcification in proximal ureter with mild dilation of the left renal pelvis - 2 calcifications noted in the mid calyces  Indications:  58 year old female presents for surgical management of a left ureteral calculus and left nephrolithiasis.  She presented to the emergency room at [facility] on 07/22/2024 with acute onset of left-sided flank pain.  CT imaging showed an 8 mm proximal left ureteral calculus with moderate hydronephrosis as well as several nonobstructing left renal calculi.  Her pain has been well-managed.  She is on Xarelto  for history of DVT.  She has received medical clearance to hold this in preparation for surgical management.  She presents now for cystoscopy, left retrograde pyelogram, left ureteroscopic laser lithotripsy, and insertion of left ureteral stent.  Risks, benefits, alternatives were discussed with the patient in detail.  Potential risks including, but not limited to, infection; bleeding;  injury to urethra, bladder, or ureter; possible need of other treatments; possible failure to remove the calculus; ureteral  stricture formation; cardiac, pulmonary, cerebrovascular events; and anesthetic complications were discussed.  The patient understands  and wishes to proceed.   Description of Procedure:  The patient was taken to the operating room suite and properly identified.  After successful induction of a general anesthetic, she was placed in the dorsolithotomy position.  The patient received IV Rocephin  preoperatively.  The patient's genital area was prepped and draped in sterile fashion.  A preoperative timeout was performed.  Under direct visualization, a 21 French rigid cystoscope was passed through the urethra and into the bladder.  The bladder was inspected and no mucosal abnormalities were seen.  A normal-appearing trigone was noted with a single orifice bilaterally.  A left retrograde pyelogram was performed for evaluation of the patient's left upper tract given her diagnosis of left ureteral calculus.  Scout film showed a nonspecific bowel gas pattern and no obvious bony abnormalities.  An 8 mm calcification was seen in the upper left abdomen consistent with the known left proximal ureteral calculus.  There were 2 other apparent calcifications noted over the left renal shadow.  The left ureter was catheterized using a 5 [place] open-ended catheter.  Contrast was injected into the left ureter demonstrating a normal-appearing left distal and mid ureter.  The previously noted calculus was confirmed to be in the proximal left ureter near the UPJ. A sensor wire was passed through the open-ended catheter and curled in the left renal pelvis under fluoroscopic guidance.  A zip wire was passed under cystoscopic and fluoroscopic guidance into the left renal pelvis.  I attempted to pass a 11/13 [place] ureteral access sheath over the zip wire but met some resistance in the distal ureter.  I then used a semirigid ureteroscope to perform ureteroscopy and passively dilate the ureter.  Ureteroscopy was performed up to the UPJ.  No ureteral  abnormalities were appreciated.  The ureteral calculus had been pushed proximally and was now sitting in the renal  pelvis.  After removal of the ureteroscope, I was able to pass the 11/13 [place] access sheath.  Flexible ureteroscopy was then performed through the access sheath and alongside the safety wire.  The stone was visualized in the left renal pelvis.  The stone was fragmented into numerous small fragments using the 242 m holmium laser fiber with dusting settings.  Once the stone was completely fragmented into pieces that I felt would be small enough to pass without difficulty, I turned my attention to the other calcifications noted on the scout film.  These were identified in a midpole calyx.  These calcifications were also fragmented using the dusting setting on the laser.  I inspected the remainder of the calyces although at least 1 calyx would not allow passage of the ureteroscope.  I felt that the stone fragments were of a passable size and elected not to attempt retrieval of the fragments.  The ureteroscope was removed along with the access sheath.  The ureter was inspected and no injury was identified.  A 6 French by 24 cm double-J stent was then passed over the guidewire under cystoscopic and fluoroscopic guidance.  Good position was confirmed.  The tether was removed prior to stent placement.  The bladder was then drained and the cystoscope was removed.  Intraurethral lidocaine  jelly was placed.  The patient was then extubated and taken to the postanesthesia care unit in stable condition.  Complications: None  Condition: Stable, extubated, transferred to PACU  Plan:  D/C home Continue stent x 2 weeks

## 2024-08-08 ENCOUNTER — Encounter (HOSPITAL_COMMUNITY): Payer: Self-pay | Admitting: Urology

## 2024-08-08 ENCOUNTER — Encounter: Payer: Self-pay | Admitting: Urology

## 2024-08-09 ENCOUNTER — Telehealth: Payer: Self-pay | Admitting: Urology

## 2024-08-09 ENCOUNTER — Encounter (HOSPITAL_COMMUNITY): Payer: Self-pay

## 2024-08-09 ENCOUNTER — Emergency Department (HOSPITAL_COMMUNITY)
Admission: EM | Admit: 2024-08-09 | Discharge: 2024-08-09 | Disposition: A | Discharge status: Home / Self Care | Attending: Emergency Medicine | Admitting: Emergency Medicine

## 2024-08-09 ENCOUNTER — Emergency Department (HOSPITAL_COMMUNITY)

## 2024-08-09 ENCOUNTER — Other Ambulatory Visit: Payer: Self-pay

## 2024-08-09 ENCOUNTER — Encounter: Payer: Self-pay | Admitting: Family Medicine

## 2024-08-09 DIAGNOSIS — R109 Unspecified abdominal pain: Secondary | ICD-10-CM | POA: Diagnosis not present

## 2024-08-09 DIAGNOSIS — K5730 Diverticulosis of large intestine without perforation or abscess without bleeding: Secondary | ICD-10-CM | POA: Diagnosis not present

## 2024-08-09 DIAGNOSIS — N132 Hydronephrosis with renal and ureteral calculous obstruction: Secondary | ICD-10-CM | POA: Diagnosis not present

## 2024-08-09 DIAGNOSIS — D72829 Elevated white blood cell count, unspecified: Secondary | ICD-10-CM | POA: Insufficient documentation

## 2024-08-09 DIAGNOSIS — Z7901 Long term (current) use of anticoagulants: Secondary | ICD-10-CM | POA: Insufficient documentation

## 2024-08-09 DIAGNOSIS — R9431 Abnormal electrocardiogram [ECG] [EKG]: Secondary | ICD-10-CM | POA: Diagnosis not present

## 2024-08-09 DIAGNOSIS — R319 Hematuria, unspecified: Secondary | ICD-10-CM | POA: Diagnosis not present

## 2024-08-09 DIAGNOSIS — N83291 Other ovarian cyst, right side: Secondary | ICD-10-CM | POA: Diagnosis not present

## 2024-08-09 LAB — CBC
HCT: 37.8 % (ref 36.0–46.0)
Hemoglobin: 12.7 g/dL (ref 12.0–15.0)
MCH: 31.5 pg (ref 26.0–34.0)
MCHC: 33.6 g/dL (ref 30.0–36.0)
MCV: 93.8 fL (ref 80.0–100.0)
Platelets: 261 K/uL (ref 150–400)
RBC: 4.03 MIL/uL (ref 3.87–5.11)
RDW: 14.1 % (ref 11.5–15.5)
WBC: 15.8 K/uL — ABNORMAL HIGH (ref 4.0–10.5)
nRBC: 0 % (ref 0.0–0.2)

## 2024-08-09 LAB — URINALYSIS, ROUTINE W REFLEX MICROSCOPIC
Bilirubin Urine: NEGATIVE
Glucose, UA: 50 mg/dL — AB
Ketones, ur: NEGATIVE mg/dL
Leukocytes,Ua: NEGATIVE
Nitrite: POSITIVE — AB
Protein, ur: 100 mg/dL — AB
RBC / HPF: >50 RBC/hpf (ref 0–5)
Specific Gravity, Urine: 1.015 (ref 1.005–1.030)
pH: 6 (ref 5.0–8.0)

## 2024-08-09 LAB — COMPREHENSIVE METABOLIC PANEL WITH GFR
ALT: 19 U/L (ref 0–44)
AST: 20 U/L (ref 15–41)
Albumin: 4 g/dL (ref 3.5–5.0)
Alkaline Phosphatase: 82 U/L (ref 38–126)
Anion gap: 13 (ref 5–15)
BUN: 13 mg/dL (ref 6–20)
CO2: 23 mmol/L (ref 22–32)
Calcium: 9.4 mg/dL (ref 8.9–10.3)
Chloride: 102 mmol/L (ref 98–111)
Creatinine, Ser: 0.63 mg/dL (ref 0.44–1.00)
GFR, Estimated: >60 mL/min (ref >60)
Glucose, Bld: 131 mg/dL — ABNORMAL HIGH (ref 70–99)
Potassium: 3.2 mmol/L — ABNORMAL LOW (ref 3.5–5.1)
Sodium: 138 mmol/L (ref 135–145)
Total Bilirubin: 0.5 mg/dL (ref 0.0–1.2)
Total Protein: 6.9 g/dL (ref 6.5–8.1)

## 2024-08-09 LAB — PROTIME-INR
INR: 2.2 — ABNORMAL HIGH (ref 0.8–1.2)
Prothrombin Time: 25.3 s — ABNORMAL HIGH (ref 11.4–15.2)

## 2024-08-09 MED ORDER — HYDROMORPHONE HCL 1 MG/ML IJ SOLN
0.5000 mg | Freq: Once | INTRAMUSCULAR | Status: AC
  Administered 2024-08-09: 0.5 mg via INTRAVENOUS
  Filled 2024-08-09: qty 1

## 2024-08-09 MED ORDER — POTASSIUM CHLORIDE CRYS ER 20 MEQ PO TBCR
40.0000 meq | EXTENDED_RELEASE_TABLET | Freq: Once | ORAL | Status: AC
  Administered 2024-08-09: 40 meq via ORAL
  Filled 2024-08-09: qty 2

## 2024-08-09 MED ORDER — KETOROLAC TROMETHAMINE 15 MG/ML IJ SOLN: 15.0000 mg | Freq: Once | INTRAMUSCULAR | Status: DC

## 2024-08-09 MED ORDER — ONDANSETRON HCL 4 MG/2ML IJ SOLN
4.0000 mg | Freq: Once | INTRAMUSCULAR | Status: AC
  Administered 2024-08-09: 4 mg via INTRAVENOUS
  Filled 2024-08-09: qty 2

## 2024-08-09 NOTE — Telephone Encounter (Signed)
 Attempted to reach pt, no answer, LMOM telling pt to hold the Xarelto  until Dr Roseann hears back from Dr Frann.

## 2024-08-09 NOTE — Anesthesia Postprocedure Evaluation (Signed)
 Anesthesia Post Note  Patient: JORDANNA HENDRIE  Procedure(s) Performed: CYSTOSCOPY/URETEROSCOPY/HOLMIUM LASER/STENT PLACEMENT/RETROGRADE PYELOGRAM (Left: Ureter)     Patient location during evaluation: PACU Anesthesia Type: General Level of consciousness: awake and alert Pain management: pain level controlled Vital Signs Assessment: post-procedure vital signs reviewed and stable Respiratory status: spontaneous breathing, nonlabored ventilation, respiratory function stable and patient connected to nasal cannula oxygen Cardiovascular status: blood pressure returned to baseline and stable Postop Assessment: no apparent nausea or vomiting Anesthetic complications: no   No notable events documented.  Last Vitals:  Vitals:   08/07/24 1620 08/07/24 1630  BP: 121/72 127/64  Pulse: 86 81  Resp: 14 15  Temp: 36.5 C 36.6 C  SpO2: 96% 96%    Last Pain:  Vitals:   08/07/24 1630  TempSrc:   PainSc: 0-No pain                 Tysen Roesler L Reily Ilic

## 2024-08-09 NOTE — Discharge Instructions (Addendum)
 It was a pleasure taking part in your care.  As we discussed, your workup here is reassuring.  Please hold off on anticoagulation until you have discussed this with Dr. Roseann.  Please return to the ED with any new symptoms.  Continue taking all medications as prescribed.

## 2024-08-09 NOTE — ED Triage Notes (Signed)
 Pt. Arrives pov for left flank pain. States that she had her kidney stones lasered recently, so she stopped taking her blood thinners prior. Just started taking them again yesterday and began urinating blood clots.

## 2024-08-09 NOTE — Telephone Encounter (Signed)
 Pt had surgery on the 9th with [facility] pt stated she was advised by [facility] that she could resume with her blood thinners on 08/09/24. Pt ended up in er on 08/09/24 due to bleeding when urinating as well as bleeding blood clots. ER Dr asked pt to call [facility] and seen if she could stop the blood thinners until she comes to have stent removed on 08/23/24. Notes are in care everywhere from Er visit.

## 2024-08-09 NOTE — ED Provider Notes (Signed)
 [place] EMERGENCY DEPARTMENT AT [facility] Provider Note   CSN: [id number] Arrival date & time: 08/09/24  0301     Patient presents with: Flank Pain   Cathy Simmons is a 58 y.o. female with medical history of asplenia, kidney stones, history of blood clots, history of cerebral venous sinus thrombosis on anticoagulation.  Patient presents to ED for evaluation of blood clots in urine.  States that she had stent placed on Tuesday of this past week with Dr. Roseann of urology.  Patient had stent placed due to finding out she had 7 to 8 mm stone on the left side at previous ER visit.  Patient reports that this evening she began taking her anticoagulation again.  States that she discontinued anticoagulation on Saturday and began it tonight.  Reports she took around 930 and then began having blood clots in her urine and increased left-sided flank pain.  She endorses nausea without vomiting.  She denies diarrhea or fevers.  She denies urinary symptoms.   Flank Pain       Prior to Admission medications   Medication Sig Start Date End Date Taking? Authorizing Provider  acetaminophen  (TYLENOL ) 650 MG CR tablet Take 650-1,300 mg by mouth every 8 (eight) hours as needed for pain.    [provider]  Ascorbic Acid (SUPER C COMPLEX PO) Take 1 capsule by mouth at bedtime.    [provider]  cetirizine (ZYRTEC) 10 MG tablet Take 10 mg by mouth at bedtime.    [provider]  clonazePAM  (KLONOPIN ) 0.5 MG tablet Take 1 tablet (0.5 mg total) by mouth 2 (two) times daily as needed. for anxiety 07/19/22   Frann Mabel Mt, DO  Cyanocobalamin (VITAMIN B-12 PO) Take 1 tablet by mouth at bedtime.    [provider]  esomeprazole (NEXIUM) 20 MG capsule Take 20 mg by mouth every evening.    [provider]  nitrofurantoin , macrocrystal-monohydrate, (MACROBID ) 100 MG capsule Take 1 capsule (100 mg total) by mouth daily. 08/07/24   Stoneking,  Adine PARAS., MD  oxyCODONE -acetaminophen  (PERCOCET/ROXICET) 5-325 MG tablet Take 1 tablet by mouth every 6 (six) hours as needed (severe pain.). 08/07/24   Stoneking, Adine PARAS., MD  phenazopyridine  (PYRIDIUM ) 200 MG tablet Take 1 tablet (200 mg total) by mouth 3 (three) times daily as needed for pain. 08/07/24 08/07/25  Stoneking, Adine PARAS., MD  promethazine  (PHENERGAN ) 25 MG tablet Take 1 tablet (25 mg total) by mouth every 8 (eight) hours as needed for nausea or vomiting. 04/11/24   Frann, Mabel Mt, DO  rosuvastatin  (CRESTOR ) 20 MG tablet Take 1 tablet (20 mg total) by mouth daily. Patient taking differently: Take 20 mg by mouth at bedtime. 05/10/24   Frann Mabel Mt, DO  tamsulosin  (FLOMAX ) 0.4 MG CAPS capsule Take 1 capsule (0.4 mg total) by mouth daily. 08/07/24   Stoneking, Adine PARAS., MD  XARELTO  20 MG TABS tablet TAKE 1 TABLET BY MOUTH ONCE DAILY WITH SUPPER 05/14/24   Wendling, Mabel Mt, DO    Allergies: Bactrim [sulfamethoxazole-trimethoprim], Duloxetine, and Adhesive [tape]    Review of Systems  Genitourinary:  Positive for flank pain and hematuria.  All other systems reviewed and are negative.   Updated Vital Signs BP (!) 117/54   Pulse 85   Temp 98.5 F (36.9 C)   Resp 18   LMP 08/29/2018   SpO2 96%   Physical Exam Vitals and nursing note reviewed.  Constitutional:      General: She is  not in acute distress.    Appearance: She is well-developed.  HENT:     Head: Normocephalic and atraumatic.  Eyes:     Conjunctiva/sclera: Conjunctivae normal.  Cardiovascular:     Rate and Rhythm: Normal rate and regular rhythm.     Heart sounds: No murmur heard. Pulmonary:     Effort: Pulmonary effort is normal. No respiratory distress.     Breath sounds: Normal breath sounds.  Abdominal:     Palpations: Abdomen is soft.     Tenderness: There is no abdominal tenderness. There is left CVA tenderness.  Musculoskeletal:        General: No swelling.     Cervical back:  Neck supple.  Skin:    General: Skin is warm and dry.     Capillary Refill: Capillary refill takes less than 2 seconds.  Neurological:     Mental Status: She is alert.  Psychiatric:        Mood and Affect: Mood normal.     (all labs ordered are listed, but only abnormal results are displayed) Labs Reviewed  CBC - Abnormal; Notable for the following components:      Result Value   WBC 15.8 (*)    All other components within normal limits  COMPREHENSIVE METABOLIC PANEL WITH GFR - Abnormal; Notable for the following components:   Potassium 3.2 (*)    Glucose, Bld 131 (*)    All other components within normal limits  URINALYSIS, ROUTINE W REFLEX MICROSCOPIC - Abnormal; Notable for the following components:   APPearance CLOUDY (*)    Glucose, UA 50 (*)    Hgb urine dipstick MODERATE (*)    Protein, ur 100 (*)    Nitrite POSITIVE (*)    Bacteria, UA FEW (*)    All other components within normal limits  PROTIME-INR - Abnormal; Notable for the following components:   Prothrombin Time 25.3 (*)    INR 2.2 (*)    All other components within normal limits  URINE CULTURE    EKG: EKG Interpretation Date/Time:  Thursday August 09 2024 03:55:29 EDT Ventricular Rate:  83 PR Interval:  145 QRS Duration:  109 QT Interval:  382 QTC Calculation: 449 R Axis:   40  Text Interpretation: Sinus rhythm Abnormal R-wave progression, early transition Borderline T abnormalities, diffuse leads Confirmed by Bari Pfeiffer ([id number]) on 08/09/2024 3:57:26 AM  Radiology: CT Renal Stone Study Result Date: 08/09/2024 CLINICAL DATA:  58 year old female with abdomen and flank pain, hematuria, and stent recently placed for kidney stones. EXAM: CT ABDOMEN AND PELVIS WITHOUT CONTRAST TECHNIQUE: Multidetector CT imaging of the abdomen and pelvis was performed following the standard protocol without IV contrast. RADIATION DOSE REDUCTION: This exam was performed according to the departmental  dose-optimization program which includes automated exposure control, adjustment of the mA and/or kV according to patient size and/or use of iterative reconstruction technique. COMPARISON:  CT Abdomen and Pelvis 04/09/2024. FINDINGS: Lower chest: Mild lung base atelectasis or scarring. Visible heart size remains normal. No pericardial or pleural effusion. Small gastric hiatal hernia on series 2, image 9 is stable. Hepatobiliary: Noncontrast liver and gallbladder appear stable and negative. Pancreas: Chronic partial atrophy. Spleen: Chronic splenectomy. Adrenals/Urinary Tract: Normal adrenal glands. Noncontrast right kidney and right ureter are negative. Extensive left nephrolithiasis and left double-J ureteral stent is in place. Proximal and distal stent pigtail loops appear appropriately positioned. Dilated left renal pelvis with some hyperdense material in addition to low-density urine adjacent to the stent (series 2,  image 31). Trace gas within the left renal pelvis. Mild to moderate left hydronephrosis overall. Numerous intrarenal calculi, the largest are about 4 mm. No left hydroureter along the course of the stent. Trace gas within the urinary bladder. Numerous pelvic phleboliths but no stones convincingly within the bladder lumen. Stomach/Bowel: Decompressed large bowel from the splenic flexure distally. Diverticulosis of the descending and sigmoid colon without active inflammation. Nondilated transverse and right colon. Normal appendix on series 2, image 59. Nondilated small bowel throughout the abdomen and pelvis. Chronic surgical clips at the gastric cardia, small gastric hiatal hernia. Negative duodenum. No pneumoperitoneum. No free fluid or mesenteric inflammation. Vascular/Lymphatic: Negative noncontrast appearance. No lymphadenopathy identified. Reproductive: Negative noncontrast uterus and left ovary. A chronic right ovarian cyst with simple fluid density measuring 3.5 cm has been present since at  least 2021. No follow-up imaging recommended. Other: No pelvis free fluid. Musculoskeletal: Dextroconvex scoliosis of the lower thoracic and lumbar spine. No acute or suspicious osseous lesion. IMPRESSION: 1. Left double-J ureteral stent in place appears appropriately positioned. Trace gas in the left renal collecting system and urinary bladder, query recent stent placement. Evidence of Hematuria in the left renal pelvis, and moderately hydronephrotic left kidney despite the stent. Extensive underlying left nephrolithiasis, stones up to 4 mm. Left ureter appears decompressed, and no urinary calculus identified outside of the left kidney. 2. No other acute or inflammatory process identified in the noncontrast abdomen or pelvis. Chronic small hiatal hernia. A simple fluid density right ovarian cyst has been present since 2021. Diverticulosis of the distal large bowel without active inflammation. Electronically Signed   By: VEAR Hurst M.D.   On: 08/09/2024 04:39   DG C-Arm 1-60 Min-No Report Result Date: 08/07/2024 Fluoroscopy was utilized by the requesting physician.  No radiographic interpretation.    Procedures   Medications Ordered in the ED  ondansetron  (ZOFRAN ) injection 4 mg (4 mg Intravenous Given 08/09/24 0345)  HYDROmorphone  (DILAUDID ) injection 0.5 mg (0.5 mg Intravenous Given 08/09/24 0344)  potassium chloride  SA (KLOR-CON  M) CR tablet 40 mEq (40 mEq Oral Given 08/09/24 0547)    Clinical Course as of 08/09/24 0635  Thu Aug 09, 2024  0327 Stopped [facility] Saturday for procedure on Tuesday. Restarted blood thinner tonight. 2am began experiencing blood in urine, increased pain, sharp, left flank. Took oxy, got in shower, began having increased blood clots. No nausea, [CG]  [id number] cystoscopy, left retrograde pyelogram, left ureteroscopic laser lithotripsy, and insertion of left ureteral stent. [CG]  [id number] On oxy, macrobid , flomax  [CG]  [id number] Concern about hematuria, should we begin patient back on blood  thinners [CG]    Clinical Course User Index [CG] Ruthell Lonni FALCON, PA-C   Medical Decision Making Amount and/or Complexity of Data Reviewed Labs: ordered. Radiology: ordered. ECG/medicine tests: ordered.  Risk Prescription drug management.   This is a 58 year old female who recently had stent placed on left side for nephrolithiasis.  Patient presents to ED out of concern of hematuria after restarting blood thinner tonight.  On exam, she is hemodynamically stable.  Afebrile and nontachycardic.  Lung sounds clear bilaterally, no hypoxia.  Abdomen soft and compressible with left-sided CVA tenderness.  Neuroexam at baseline.  Patient initially given 4 mg Zofran , 0.5 mg Dilaudid .  Labs collected to include CBC, CMP, urinalysis, PT/INR.  Also assessed with CT renal stone study.  CBC with leukocytosis 15.8.  No anemia.  Metabolic panel potassium 3.2 repleted with 40 mEq oral potassium, no other electrolyte derangement, no elevated LFT,  anion gap 13.  PT/INR appropriately elevated.  CT renal stone study reveals a left double-J ureteral stent in place appears appropriately positioned.  Trace gas in left renal collecting system and urinary bladder which is most likely due to recent stent placement.  Evidence of hematuria in the left renal pelvis and moderately hydronephrotic left kidney despite the stent.  Extensive underlying left nephrolithiasis with stone measuring up to 4 mm.  Left ureter appears decompressed and there is no urinary calculus identified outside the left kidney.  Patient urinalysis resulted and reveals moderate hemoglobin, glucose, positive nitrite urine without leukocytes.  Patient is already on Macrobid .  Patient urine cultured at this time.  She was advised to continue taking antibiotic as prescribed.  Discussed patient findings with Dr. Renda of urology.  Dr. Renda suggest having patient follow-up with Dr. Roseann today, continue to hold anticoagulation until she has  talked to Dr. Roseann.  I relayed this information to the patient and she voiced understanding.  The patient will hold off on anticoagulation until she discusses this with Dr. Roseann this morning.  She was advised to follow-up in the ED for any new or worsening symptoms.  She voiced understanding.  Stable to discharge home.    Final diagnoses:  Left flank pain    ED Discharge Orders     None          Ruthell Lonni JULIANNA DEVONNA 08/09/24 0636    Bari Charmaine JULIANNA, MD 08/11/24 2330

## 2024-08-10 ENCOUNTER — Ambulatory Visit: Payer: Self-pay | Admitting: Family Medicine

## 2024-08-10 ENCOUNTER — Ambulatory Visit: Admitting: Family Medicine

## 2024-08-10 VITALS — BP 120/72 | HR 88 | Temp 97.6°F | Resp 16 | Ht 62.0 in | Wt 141.0 lb

## 2024-08-10 DIAGNOSIS — E876 Hypokalemia: Secondary | ICD-10-CM | POA: Diagnosis not present

## 2024-08-10 DIAGNOSIS — R310 Gross hematuria: Secondary | ICD-10-CM

## 2024-08-10 LAB — BASIC METABOLIC PANEL WITH GFR
BUN: 16 mg/dL (ref 6–23)
CO2: 29 meq/L (ref 19–32)
Calcium: 9.5 mg/dL (ref 8.4–10.5)
Chloride: 103 meq/L (ref 96–112)
Creatinine, Ser: 0.64 mg/dL (ref 0.40–1.20)
GFR: 96.77 mL/min (ref >60.00)
Glucose, Bld: 94 mg/dL (ref 70–99)
Potassium: 3.9 meq/L (ref 3.5–5.1)
Sodium: 140 meq/L (ref 135–145)

## 2024-08-10 LAB — URINE CULTURE: Culture: NO GROWTH

## 2024-08-10 LAB — MAGNESIUM: Magnesium: 2 mg/dL (ref 1.5–2.5)

## 2024-08-10 NOTE — Telephone Encounter (Signed)
 Pt contacted and notified of the message from Dr. Frann to hold of on the Xarelto  for a week 08/10/24.

## 2024-08-10 NOTE — Patient Instructions (Signed)
 Give [place] 2-3 business days to get the results of your labs back.  Let us know if you need anything.

## 2024-08-10 NOTE — Progress Notes (Signed)
 Chief Complaint  Patient presents with   Follow-up    Follow Up     Subjective: Patient is a 58 y.o. female here for Er f/u.  Went in for hematuria following stent placement.  Potassium found to be 3.2. Leukocytosis noted, had lithotripsy 2 d ago. Bleeding yesterday, no anemia. Urology is having her hold Xarelto  for a week which she has now started.   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 No accessory muscle use Psych: Age appropriate judgment and insight, normal affect and mood  Assessment and Plan: Gross hematuria  Hypokalemia - Plan: Basic metabolic panel with GFR, Magnesium   Agree with holding Xarelto  for a week after discussing w pharmacy team. No signs of anemia in ER. Reck.  The patient voiced understanding and agreement to the plan.  Mabel Mt McGuffey, DO 08/10/24  10:08 AM

## 2024-08-13 ENCOUNTER — Other Ambulatory Visit: Payer: Self-pay | Admitting: Family Medicine

## 2024-08-16 ENCOUNTER — Other Ambulatory Visit: Payer: Self-pay

## 2024-08-16 ENCOUNTER — Emergency Department (HOSPITAL_COMMUNITY)

## 2024-08-16 ENCOUNTER — Encounter: Payer: Self-pay | Admitting: Family Medicine

## 2024-08-16 ENCOUNTER — Encounter (HOSPITAL_COMMUNITY): Payer: Self-pay | Admitting: Emergency Medicine

## 2024-08-16 ENCOUNTER — Emergency Department (HOSPITAL_COMMUNITY)
Admission: EM | Admit: 2024-08-16 | Discharge: 2024-08-16 | Disposition: A | Discharge status: Home / Self Care | Attending: Emergency Medicine | Admitting: Emergency Medicine

## 2024-08-16 DIAGNOSIS — J90 Pleural effusion, not elsewhere classified: Secondary | ICD-10-CM | POA: Diagnosis not present

## 2024-08-16 DIAGNOSIS — J9811 Atelectasis: Secondary | ICD-10-CM | POA: Diagnosis not present

## 2024-08-16 DIAGNOSIS — N200 Calculus of kidney: Secondary | ICD-10-CM | POA: Diagnosis not present

## 2024-08-16 DIAGNOSIS — R0602 Shortness of breath: Secondary | ICD-10-CM | POA: Insufficient documentation

## 2024-08-16 DIAGNOSIS — R000 Tachycardia, unspecified: Secondary | ICD-10-CM | POA: Diagnosis not present

## 2024-08-16 DIAGNOSIS — Z7901 Long term (current) use of anticoagulants: Secondary | ICD-10-CM | POA: Diagnosis not present

## 2024-08-16 DIAGNOSIS — I2510 Atherosclerotic heart disease of native coronary artery without angina pectoris: Secondary | ICD-10-CM | POA: Diagnosis not present

## 2024-08-16 DIAGNOSIS — R002 Palpitations: Secondary | ICD-10-CM | POA: Diagnosis not present

## 2024-08-16 LAB — BASIC METABOLIC PANEL WITH GFR
Anion gap: 13 (ref 5–15)
BUN: 12 mg/dL (ref 6–20)
CO2: 23 mmol/L (ref 22–32)
Calcium: 9.6 mg/dL (ref 8.9–10.3)
Chloride: 108 mmol/L (ref 98–111)
Creatinine, Ser: 0.54 mg/dL (ref 0.44–1.00)
GFR, Estimated: >60 mL/min (ref >60)
Glucose, Bld: 102 mg/dL — ABNORMAL HIGH (ref 70–99)
Potassium: 3.9 mmol/L (ref 3.5–5.1)
Sodium: 145 mmol/L (ref 135–145)

## 2024-08-16 LAB — CBC
HCT: 41.4 % (ref 36.0–46.0)
Hemoglobin: 13.3 g/dL (ref 12.0–15.0)
MCH: 30.3 pg (ref 26.0–34.0)
MCHC: 32.1 g/dL (ref 30.0–36.0)
MCV: 94.3 fL (ref 80.0–100.0)
Platelets: 350 K/uL (ref 150–400)
RBC: 4.39 MIL/uL (ref 3.87–5.11)
RDW: 13.5 % (ref 11.5–15.5)
WBC: 7 K/uL (ref 4.0–10.5)
nRBC: 0 % (ref 0.0–0.2)

## 2024-08-16 LAB — TROPONIN T, HIGH SENSITIVITY: Troponin T High Sensitivity: <15 ng/L (ref 0–19)

## 2024-08-16 MED ORDER — IOHEXOL 350 MG/ML SOLN
80.0000 mL | Freq: Once | INTRAVENOUS | Status: AC | PRN
Start: 2024-08-16 — End: 2024-08-16
  Administered 2024-08-16: 80 mL via INTRAVENOUS

## 2024-08-16 NOTE — ED Triage Notes (Signed)
 PT reports SHOB that started at 0100 this morning. Pt reports she last took her xarelto  on 9/10. Pt had stopped taking it due to a procedure she had done.

## 2024-08-16 NOTE — ED Provider Notes (Signed)
 [place] EMERGENCY DEPARTMENT AT [facility] Provider Note   CSN: [id number] Arrival date & time: 08/16/24  1142     Patient presents with: Shortness of Breath   Cathy Simmons is a 58 y.o. female.   Patient presents with shortness of breath that began early this morning.  Also noticed palpitations, tachycardia and elevated blood pressure to 140s systolic today.  Was recently treated surgically for a kidney stone and has been holding her Xarelto  (history of cerebral venous sinus thrombosis) for about 10 days around that procedure.  Attempted to restart Xarelto  on 9/11 however experienced hematuria after taking 1 dose.  Presented to the ED at that time and was told to continue holding her Xarelto , her PCP told her to hold it for an additional week.  Denies lower extremity swelling or pain.   Shortness of Breath      Prior to Admission medications   Medication Sig Start Date End Date Taking? Authorizing Provider  acetaminophen  (TYLENOL ) 650 MG CR tablet Take 650-1,300 mg by mouth every 8 (eight) hours as needed for pain.    [provider]  Ascorbic Acid (SUPER C COMPLEX PO) Take 1 capsule by mouth at bedtime.    [provider]  cetirizine (ZYRTEC) 10 MG tablet Take 10 mg by mouth at bedtime.    [provider]  clonazePAM  (KLONOPIN ) 0.5 MG tablet Take 1 tablet (0.5 mg total) by mouth 2 (two) times daily as needed. for anxiety 07/19/22   Frann Mabel Mt, DO  Cyanocobalamin (VITAMIN B-12 PO) Take 1 tablet by mouth at bedtime.    [provider]  esomeprazole (NEXIUM) 20 MG capsule Take 20 mg by mouth every evening.    [provider]  nitrofurantoin , macrocrystal-monohydrate, (MACROBID ) 100 MG capsule Take 1 capsule (100 mg total) by mouth daily. 08/07/24   Stoneking, Adine PARAS., MD  oxyCODONE -acetaminophen  (PERCOCET/ROXICET) 5-325 MG tablet Take 1 tablet by mouth every 6 (six) hours as needed (severe pain.). 08/07/24    Stoneking, Adine PARAS., MD  phenazopyridine  (PYRIDIUM ) 200 MG tablet Take 1 tablet (200 mg total) by mouth 3 (three) times daily as needed for pain. 08/07/24 08/07/25  Stoneking, Adine PARAS., MD  promethazine  (PHENERGAN ) 25 MG tablet Take 1 tablet (25 mg total) by mouth every 8 (eight) hours as needed for nausea or vomiting. 04/11/24   Frann, Mabel Mt, DO  rivaroxaban  (XARELTO ) 20 MG TABS tablet Take 1 tablet (20 mg total) by mouth daily with supper. 08/13/24   Frann Mabel Mt, DO  rosuvastatin  (CRESTOR ) 20 MG tablet Take 1 tablet (20 mg total) by mouth daily. Patient taking differently: Take 20 mg by mouth at bedtime. 05/10/24   Frann Mabel Mt, DO  tamsulosin  (FLOMAX ) 0.4 MG CAPS capsule Take 1 capsule (0.4 mg total) by mouth daily. 08/07/24   Stoneking, Adine PARAS., MD    Allergies: Bactrim [sulfamethoxazole-trimethoprim], Duloxetine, and Adhesive [tape]    Review of Systems  Respiratory:  Positive for shortness of breath.     Updated Vital Signs BP (!) 156/79 (BP Location: Left Arm)   Pulse (!) 115   Temp 98.8 F (37.1 C) (Oral)   Resp 20   LMP 08/29/2018   SpO2 98%   Physical Exam Constitutional:      General: She is not in acute distress.    Appearance: She is well-developed.  Eyes:     Extraocular Movements: Extraocular movements intact.  Cardiovascular:     Rate and Rhythm: Regular rhythm. Tachycardia present.  Pulmonary:     Effort: Pulmonary effort is normal. No tachypnea.     Breath sounds: Normal breath sounds.  Abdominal:     General: Bowel sounds are normal.     Palpations: Abdomen is soft.     Tenderness: There is no abdominal tenderness.  Musculoskeletal:     Right lower leg: No swelling or tenderness.     Left lower leg: No swelling or tenderness.  Skin:    General: Skin is warm and dry.  Neurological:     General: No focal deficit present.     Mental Status: She is alert.  Psychiatric:        Mood and Affect: Mood normal.        Behavior:  Behavior normal.     (all labs ordered are listed, but only abnormal results are displayed) Labs Reviewed  BASIC METABOLIC PANEL WITH GFR  CBC    EKG: None  Radiology: No results found.   Procedures   Medications Ordered in the ED - No data to display                                  Medical Decision Making 58 year old female with history of cerebral venous sinus thrombosis on lifelong Xarelto  here for acute onset of shortness of breath and tachycardia.  Wells score 7.5 given likelihood of PE, tachycardia, recent surgery, prior PE.  CTA chest negative for PE.  Troponins ordered and pending.  Sign out given to oncoming ED provider.    Amount and/or Complexity of Data Reviewed Labs: ordered. Radiology: ordered.  Risk Prescription drug management.       Final diagnoses:  None    ED Discharge Orders     None          Adele Song, MD 08/16/24 1504    Dasie Faden, MD 08/17/24 [phone]

## 2024-08-16 NOTE — ED Provider Notes (Signed)
 I saw and evaluated the patient, reviewed the resident's note and I agree with the findings and plan.  EKG Interpretation Date/Time:  Thursday August 16 2024 11:50:38 EDT Ventricular Rate:  113 PR Interval:  124 QRS Duration:  104 QT Interval:  329 QTC Calculation: 452 R Axis:   15  Text Interpretation: Sinus tachycardia Low voltage, extremity and precordial leads Abnormal R-wave progression, early transition Confirmed by Dasie Faden ([id number]) on 08/16/2024 12:55:59 PM   EKG shows sinus tachycardia.  Patient presents with acute shortness of breath which began today when she awoke.  She is tachycardic here.  Concern for possible pulmonary embolus.  Awaiting results of CT angio chest and laboratory studies      Dasie Faden, MD 08/16/24 1256

## 2024-08-16 NOTE — ED Provider Notes (Signed)
 Received patient in turnover from Dr. Dasie.  Please see their note for further details of Hx, PE.  Briefly patient is a 58 y.o. female with a Shortness of Breath .  Plan to get a troponin.  If negative likely discharge home.  Troponin is normal.  CT angiogram of the chest without PE.  Discharged home.SABRA Quale, Rolan, DO 08/16/24 1544

## 2024-08-16 NOTE — Discharge Instructions (Signed)
 Your test here look reassuring.  Please follow-up with your family doctor in the office.  Please return to the emergency department for worsening difficulty breathing if you cough up blood or if you pass out.

## 2024-08-23 ENCOUNTER — Encounter: Payer: Self-pay | Admitting: Urology

## 2024-08-23 ENCOUNTER — Ambulatory Visit (INDEPENDENT_AMBULATORY_CARE_PROVIDER_SITE_OTHER): Admitting: Urology

## 2024-08-23 VITALS — BP 142/89 | HR 91 | Ht 62.0 in | Wt 145.0 lb

## 2024-08-23 DIAGNOSIS — N202 Calculus of kidney with calculus of ureter: Secondary | ICD-10-CM | POA: Diagnosis not present

## 2024-08-23 DIAGNOSIS — N201 Calculus of ureter: Secondary | ICD-10-CM | POA: Diagnosis not present

## 2024-08-23 DIAGNOSIS — R3129 Other microscopic hematuria: Secondary | ICD-10-CM | POA: Diagnosis not present

## 2024-08-23 DIAGNOSIS — N200 Calculus of kidney: Secondary | ICD-10-CM | POA: Diagnosis not present

## 2024-08-23 DIAGNOSIS — R8290 Unspecified abnormal findings in urine: Secondary | ICD-10-CM | POA: Diagnosis not present

## 2024-08-23 DIAGNOSIS — N3281 Overactive bladder: Secondary | ICD-10-CM | POA: Diagnosis not present

## 2024-08-23 LAB — MICROSCOPIC EXAMINATION: RBC, Urine: >30 /HPF — AB (ref 0–2)

## 2024-08-23 LAB — URINALYSIS, ROUTINE W REFLEX MICROSCOPIC
Specific Gravity, UA: >=1.03 — AB (ref 1.005–1.030)
Urobilinogen, Ur: 1 mg/dL (ref 0.2–1.0)
pH, UA: 6 (ref 5.0–7.5)

## 2024-08-23 MED ORDER — CEFTRIAXONE SODIUM 500 MG IJ SOLR
1.0000 g | Freq: Once | INTRAMUSCULAR | Status: AC
  Administered 2024-08-23: 1 g via INTRAMUSCULAR

## 2024-08-23 MED ORDER — TAMSULOSIN HCL 0.4 MG PO CAPS: 0.4000 mg | ORAL_CAPSULE | Freq: Every day | ORAL | 1 refills | Status: DC

## 2024-08-23 NOTE — Progress Notes (Addendum)
 Assessment: 1. Ureteral calculus   2. Nephrolithiasis   3. OAB (overactive bladder)   4. Microscopic hematuria   5. Abnormal urine findings     Plan: I personally reviewed the CT study from 08/09/2024 as well as the records from her ER visit. Left ureteral stent removed today Urine culture sent due to abnormal U/A Rocephin  1 gm IM given today Return to office in 4 weeks with KUB She may return to work without restriction on 09/03/2024.  Chief Complaint:  Chief Complaint  Patient presents with   Cysto Stent Removal    History of Present Illness:  Cathy Simmons is a 58 y.o. female who is seen for continued evaluation of a left ureteral calculus. She presented to the emergency room at [facility] on 07/22/2024 with acute onset of left-sided flank pain.  She had radiation of the pain to her left abdomen.  She had associated nausea and vomiting.  No fevers.  She reports an episode of gross hematuria approximately 2 weeks ago. CT imaging showed an 8 mm proximal left ureteral calculus with moderate hydronephrosis. Creatinine normal at 0.99. Her pain had been well-controlled since that time without any pain medication.  No fever.  No gross hematuria. She continued to have lower urinary tract symptoms of frequency, urgency, and urge incontinence.  She was not on any medication.  She is on Xarelto  for management of blood clots.    She underwent cystoscopy with left retrograde pyelogram, left ureteroscopic laser lithotripsy, and insertion of left ureteral stent on 08/07/2024.  The stones were completely fragmented with the holmium laser. She presented to the emergency room on 08/09/2024 with left flank pain and gross hematuria with clots.  Urinalysis showed >50 RBCs, 11-20 WBCs, few bacteria, nitrite positive.  Urine culture showed no growth. CT abdomen and pelvis without contrast showed the left ureteral stent in appropriate position with calcifications in the left kidney, no ureteral  calculi. Her Xarelto  was held per Dr. Frann.  She presents today for cystoscopy and stent removal. She completed the daily Macrobid  yesterday.  She continues to have intermittent gross hematuria.  She does have some frequency, urgency, and occasional burning with urination.  No flank pain.  No fevers or chills.   Urologic History: History of microscopic hematuria. U/A results:   4/21 3-6 RBC 5/21 7-10 RBC 6/21 7-10 RBC 8/22 0-5 RBC 11/22 6-10 RBC 9/24 3-6 RBC 10/24 11-20 RBC  CT imaging from 11/22 showed nonobstructive 5 mm left renal calculus. KUB from 08/10/2023 showed a cluster of calcifications in the upper pole of the left kidney, no obvious calcifications along the expected course of either ureter. No history of gross hematuria.  No dysuria or flank pain. She has previously had a hematuria evaluation by Dr. Rosalind at [facility] in August 2021.  CT abdomen and pelvis from 07/01/2020 showed a 7 mm left renal calculus.  Cystoscopy was normal. She has a prior history of tobacco use. CT hematuria profile from 09/30/2023 showed a 9 mm nonobstructing left lower pole renal calculus, no renal masses or obstruction, no filling defects.  She was also noted to have a 3.2 right ovarian cyst. CX bladder was normal.  She has lower urinary tract symptoms including frequency, voiding every hour, urgency, some discomfort after voiding, nocturia 2-4 times, and urge incontinence.  The symptoms have been present for some time and have worsened over the past several months.  No prior therapy for her symptoms. She was given a trial of  Solifenacin  in 10/24.  She was seen in November 2024 for further evaluation of her microscopic hematuria and overactive bladder symptoms.  She had not had any gross hematuria.  She was complaining of some left sided back pain with radiation around to the left lower quadrant and into her left thigh.  She only took the Vesicare  for approximately 2 weeks.  She  discontinued this due to some dry mouth as well as problems with strep throat making swallowing difficult.  She did not see any significant improvement in her symptoms with the medication.  She continued with frequency, urgency, and some discomfort with voiding.  She was given a trial of Myrbetriq  50 mg daily in 11/24.  CT abdomen and pelvis with contrast from 04/09/2024 showed multiple renal calculi on the left with the largest measuring 8 mm at the upper pole.   Portions of the above documentation were copied from a prior visit for review purposes only.    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   Depression    Diverticulitis    Family history of anesthesia complication    mother difficult to wake   GERD (gastroesophageal reflux disease)    History of blood clots    History of kidney stones    Hyperlipidemia    Osteoporosis    Pneumonia     Past Surgical History:  Past Surgical History:  Procedure Laterality Date   BREAST BIOPSY Right 2015   benign cysts showing apocrine metaplasia   BREAST EXCISIONAL BIOPSY Left 1995   2 times left breast, benign both times   CYSTOSCOPY/URETEROSCOPY/HOLMIUM LASER/STENT PLACEMENT Left 08/07/2024   Procedure: CYSTOSCOPY/URETEROSCOPY/HOLMIUM LASER/STENT PLACEMENT/RETROGRADE PYELOGRAM;  Surgeon: Roseann Adine PARAS., MD;  Location: [facility];  Service: Urology;  Laterality: Left;   DILATION AND CURETTAGE OF UTERUS  x2 1984 and 1991   excision left ganglion cyst Left    FOREIGN BODY REMOVAL Right 01/29/2013   Procedure: EXCISION FOREIGN BODY RIGHT SMALL FINGER;  Surgeon: Arley JONELLE Curia, MD;  Location: [facility];  Service: Orthopedics;  Laterality: Right;   SPLENECTOMY  2000   d/t calcified cyst from injury    Allergies:  Allergies  Allergen Reactions   Bactrim  [Sulfamethoxazole-Trimethoprim] Other (See Comments)    Sulfa causes yeast infections!   Duloxetine     Other Reaction(s): Other (See Comments)  Passed out   Adhesive [Tape] Rash and Other (See Comments)    Band-Aids    Family History:  Family History  Problem Relation Age of Onset   Stroke Mother    Cancer Mother        breast cancer   Breast cancer Mother    Diabetes Father    Parkinson's disease Father    Colon cancer Neg Hx    Rectal cancer Neg Hx    Esophageal cancer Neg Hx    Stomach cancer Neg Hx     Social History:  Social History   Tobacco Use   Smoking status: Former    Current packs/day: 0.00    Types: Cigarettes    Quit date: 01/30/1999    Years since quitting: 25.5   Smokeless tobacco: Never  Vaping Use   Vaping status: Never Used  Substance Use Topics   Alcohol use: No  Drug use: No    ROS: Constitutional:  Negative for fever, chills, weight loss CV: Negative for chest pain, previous MI, hypertension Respiratory:  Negative for shortness of breath, wheezing, sleep apnea, frequent cough GI:  Negative for nausea, vomiting, bloody stool, GERD  Physical exam: BP (!) 142/89   Pulse 91   Ht 5' 2 (1.575 m)   Wt 145 lb (65.8 kg)   LMP 08/29/2018   BMI 26.52 kg/m  GENERAL APPEARANCE:  Well appearing, well developed, well nourished, NAD HEENT:  Atraumatic, normocephalic, oropharynx clear NECK:  Supple without lymphadenopathy or thyromegaly ABDOMEN:  Soft, non-tender, no masses EXTREMITIES:  Moves all extremities well, without clubbing, cyanosis, or edema NEUROLOGIC:  Alert and oriented x 3, normal gait, CN II-XII grossly intact MENTAL STATUS:  appropriate BACK:  Non-tender to palpation, No CVAT SKIN:  Warm, dry, and intact  Results: U/A: 0-5 WBCs, >30 RBCs, moderate bacteria, nitrite +  CYSTOSCOPY/STENT REMOVAL  Pre-Operative Diagnosis:  Ureteral calculus  Post-Operative Diagnosis: Ureteral calculus  Anesthesia: local with lidocaine   gel  Surgical Narrative:  After appropriate informed consent was obtained, the patient was prepped and draped in the usual sterile fashion in the supine position. She was correctly identified and the proper procedure delineated prior to proceeding. Sterile lidocaine  gel was instilled in the urethra.  The flexible cystoscope was introduced without difficulty. Using stent graspers, the left ureteral stent was removed. The cystoscope was removed.  She tolerated the procedure well.  A chaperone was present throughout the procedure.

## 2024-08-23 NOTE — Progress Notes (Signed)
 IM Injection  Patient is present today for an IM Injection for treatment of infection prevention with a stent removal Drug: Ceftriaxone  Dose:1g Location:Right outter buttock Lot: [id number] Exp:09/2025 Patient tolerated well, no complications were noted  Performed by: C. Koleen, LPN

## 2024-08-25 LAB — URINE CULTURE

## 2024-08-27 ENCOUNTER — Ambulatory Visit: Payer: Self-pay | Admitting: Urology

## 2024-09-21 ENCOUNTER — Ambulatory Visit (HOSPITAL_BASED_OUTPATIENT_CLINIC_OR_DEPARTMENT_OTHER)
Admission: RE | Admit: 2024-09-21 | Discharge: 2024-09-21 | Disposition: A | Discharge status: Home / Self Care | Source: Ambulatory Visit | Attending: Urology | Admitting: Urology

## 2024-09-21 ENCOUNTER — Other Ambulatory Visit: Payer: Self-pay

## 2024-09-21 ENCOUNTER — Ambulatory Visit: Admitting: Urology

## 2024-09-21 ENCOUNTER — Encounter: Payer: Self-pay | Admitting: Urology

## 2024-09-21 VITALS — BP 123/74 | HR 70 | Ht 62.0 in | Wt 142.0 lb

## 2024-09-21 DIAGNOSIS — N201 Calculus of ureter: Secondary | ICD-10-CM

## 2024-09-21 DIAGNOSIS — N200 Calculus of kidney: Secondary | ICD-10-CM

## 2024-09-21 DIAGNOSIS — N3281 Overactive bladder: Secondary | ICD-10-CM | POA: Diagnosis not present

## 2024-09-21 LAB — MICROSCOPIC EXAMINATION

## 2024-09-21 LAB — URINALYSIS, ROUTINE W REFLEX MICROSCOPIC
Bilirubin, UA: NEGATIVE
Glucose, UA: NEGATIVE
Ketones, UA: NEGATIVE
Leukocytes,UA: NEGATIVE
Nitrite, UA: NEGATIVE
Protein,UA: NEGATIVE
Specific Gravity, UA: 1.01 (ref 1.005–1.030)
Urobilinogen, Ur: 0.2 mg/dL (ref 0.2–1.0)
pH, UA: 7 (ref 5.0–7.5)

## 2024-09-21 MED ORDER — MIRABEGRON ER 50 MG PO TB24: 50.0000 mg | ORAL_TABLET | Freq: Every day | ORAL | Status: DC

## 2024-09-21 NOTE — Progress Notes (Signed)
 Assessment: 1. Ureteral calculus   2. Nephrolithiasis   3. OAB (overactive bladder)     Plan: I personally reviewed the KUB study from today with results as noted below. Stone prevention discussed with the patient and information provided. Bladder diet sheet given. Trial of Myrbetriq  50 mg daily.  Prescription sent. Return to office in 6 weeks. Schedule for renal ultrasound prior to next visit.  Chief Complaint:  Chief Complaint  Patient presents with   Nephrolithiasis    History of Present Illness:  Cathy Simmons is a 58 y.o. female who is seen for continued evaluation of a left ureteral calculus. She presented to the emergency room at [facility] on 07/22/2024 with acute onset of left-sided flank pain.  She had radiation of the pain to her left abdomen.  She had associated nausea and vomiting.  No fevers.  She reports an episode of gross hematuria approximately 2 weeks ago. CT imaging showed an 8 mm proximal left ureteral calculus with moderate hydronephrosis. Creatinine normal at 0.99. Her pain had been well-controlled since that time without any pain medication.  No fever.  No gross hematuria. She continued to have lower urinary tract symptoms of frequency, urgency, and urge incontinence.  She was not on any medication.  She is on Xarelto  for management of blood clots.    She underwent cystoscopy with left retrograde pyelogram, left ureteroscopic laser lithotripsy, and insertion of left ureteral stent on 08/07/2024.  The stones were completely fragmented with the holmium laser. She presented to the emergency room on 08/09/2024 with left flank pain and gross hematuria with clots.  Urinalysis showed >50 RBCs, 11-20 WBCs, few bacteria, nitrite positive.  Urine culture showed no growth. CT abdomen and pelvis without contrast showed the left ureteral stent in appropriate position with calcifications in the left kidney, no ureteral calculi. Her Xarelto  was held per Dr.  Frann.  Her stent was removed on 08/23/2024.   Urine culture from 08/23/2024 grew 10-20 5K mixed flora.  She returns today for follow-up.  She has done well since the stent was removed.  She reports passing a number of stone fragments.  She is not having any flank pain, gross hematuria, or dysuria.  She continues to have fairly significant OAB symptoms with frequency, urgency, and urge incontinence.  She previously tried Vesicare  but did not see improvement in her symptoms and had problems with dry mouth.  Urologic History: History of microscopic hematuria. U/A results:   4/21 3-6 RBC 5/21 7-10 RBC 6/21 7-10 RBC 8/22 0-5 RBC 11/22 6-10 RBC 9/24 3-6 RBC 10/24 11-20 RBC  CT imaging from 11/22 showed nonobstructive 5 mm left renal calculus. KUB from 08/10/2023 showed a cluster of calcifications in the upper pole of the left kidney, no obvious calcifications along the expected course of either ureter. No history of gross hematuria.  No dysuria or flank pain. She has previously had a hematuria evaluation by Dr. Rosalind at [facility] in August 2021.  CT abdomen and pelvis from 07/01/2020 showed a 7 mm left renal calculus.  Cystoscopy was normal. She has a prior history of tobacco use. CT hematuria profile from 09/30/2023 showed a 9 mm nonobstructing left lower pole renal calculus, no renal masses or obstruction, no filling defects.  She was also noted to have a 3.2 right ovarian cyst. CX bladder was normal.  She has lower urinary tract symptoms including frequency, voiding every hour, urgency, some discomfort after voiding, nocturia 2-4 times, and urge incontinence.  The  symptoms have been present for some time and have worsened over the past several months.  No prior therapy for her symptoms. She was given a trial of Solifenacin  in 10/24.  She was seen in November 2024 for further evaluation of her microscopic hematuria and overactive bladder symptoms.  She had not had any gross hematuria.   She was complaining of some left sided back pain with radiation around to the left lower quadrant and into her left thigh.  She only took the Vesicare  for approximately 2 weeks.  She discontinued this due to some dry mouth as well as problems with strep throat making swallowing difficult.  She did not see any significant improvement in her symptoms with the medication.  She continued with frequency, urgency, and some discomfort with voiding.  She was given a trial of Myrbetriq  50 mg daily in 11/24.  CT abdomen and pelvis with contrast from 04/09/2024 showed multiple renal calculi on the left with the largest measuring 8 mm at the upper pole.   Portions of the above documentation were copied from a prior visit for review purposes only.  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   Depression    Diverticulitis    Family history of anesthesia complication    mother difficult to wake   GERD (gastroesophageal reflux disease)    History of blood clots    History of kidney stones    Hyperlipidemia    Osteoporosis    Pneumonia     Past Surgical History:  Past Surgical History:  Procedure Laterality Date   BREAST BIOPSY Right 2015   benign cysts showing apocrine metaplasia   BREAST EXCISIONAL BIOPSY Left 1995   2 times left breast, benign both times   CYSTOSCOPY/URETEROSCOPY/HOLMIUM LASER/STENT PLACEMENT Left 08/07/2024   Procedure: CYSTOSCOPY/URETEROSCOPY/HOLMIUM LASER/STENT PLACEMENT/RETROGRADE PYELOGRAM;  Surgeon: Roseann Adine PARAS., MD;  Location: [facility];  Service: Urology;  Laterality: Left;   DILATION AND CURETTAGE OF UTERUS  x2 1984 and 1991   excision left ganglion cyst Left    FOREIGN BODY REMOVAL Right 01/29/2013   Procedure: EXCISION FOREIGN BODY RIGHT SMALL FINGER;  Surgeon: Arley JONELLE Curia, MD;  Location: [facility]  [place];  Service: Orthopedics;  Laterality: Right;   SPLENECTOMY  2000   d/t calcified cyst from injury    Allergies:  Allergies  Allergen Reactions   Bactrim [Sulfamethoxazole-Trimethoprim] Other (See Comments)    Sulfa causes yeast infections!   Duloxetine     Other Reaction(s): Other (See Comments)  Passed out   Adhesive [Tape] Rash and Other (See Comments)    Band-Aids    Family History:  Family History  Problem Relation Age of Onset   Stroke Mother    Cancer Mother        breast cancer   Breast cancer Mother    Diabetes Father    Parkinson's disease Father    Colon cancer Neg Hx    Rectal cancer Neg Hx    Esophageal cancer Neg Hx    Stomach cancer Neg Hx     Social History:  Social History   Tobacco Use   Smoking status: Former    Current packs/day: 0.00    Types: Cigarettes    Quit date: 01/30/1999    Years  since quitting: 25.6   Smokeless tobacco: Never  Vaping Use   Vaping status: Never Used  Substance Use Topics   Alcohol use: No   Drug use: No    ROS: Constitutional:  Negative for fever, chills, weight loss CV: Negative for chest pain, previous MI, hypertension Respiratory:  Negative for shortness of breath, wheezing, sleep apnea, frequent cough GI:  Negative for nausea, vomiting, bloody stool, GERD  Physical exam: BP 123/74   Pulse 70   Ht 5' 2 (1.575 m)   Wt 142 lb (64.4 kg)   LMP 08/29/2018   BMI 25.97 kg/m  GENERAL APPEARANCE:  Well appearing, well developed, well nourished, NAD HEENT:  Atraumatic, normocephalic, oropharynx clear NECK:  Supple without lymphadenopathy or thyromegaly ABDOMEN:  Soft, non-tender, no masses EXTREMITIES:  Moves all extremities well, without clubbing, cyanosis, or edema NEUROLOGIC:  Alert and oriented x 3, normal gait, CN II-XII grossly intact MENTAL STATUS:  appropriate BACK:  Non-tender to palpation, No CVAT SKIN:  Warm, dry, and intact  Results: U/A: 0-5 RBCs, 0-2 RBCs  KUB: Several  2-3 mm calcification seen overlying the left renal shadow; no obvious calcifications seen along the expected course of the left ureter.

## 2024-09-28 ENCOUNTER — Ambulatory Visit (HOSPITAL_BASED_OUTPATIENT_CLINIC_OR_DEPARTMENT_OTHER)
Admission: RE | Admit: 2024-09-28 | Discharge: 2024-09-28 | Disposition: A | Discharge status: Home / Self Care | Source: Ambulatory Visit | Attending: Urology | Admitting: Urology

## 2024-09-28 DIAGNOSIS — N201 Calculus of ureter: Secondary | ICD-10-CM | POA: Diagnosis not present

## 2024-09-28 DIAGNOSIS — N200 Calculus of kidney: Secondary | ICD-10-CM | POA: Diagnosis not present

## 2024-10-01 ENCOUNTER — Ambulatory Visit: Payer: Self-pay | Admitting: Urology

## 2024-10-30 ENCOUNTER — Other Ambulatory Visit: Payer: Self-pay | Admitting: Family Medicine

## 2024-10-30 DIAGNOSIS — E7800 Pure hypercholesterolemia, unspecified: Secondary | ICD-10-CM

## 2024-11-02 ENCOUNTER — Ambulatory Visit: Admitting: Urology

## 2024-11-02 ENCOUNTER — Encounter: Payer: Self-pay | Admitting: Urology

## 2024-11-02 ENCOUNTER — Encounter: Payer: Self-pay | Admitting: Family Medicine

## 2024-11-02 VITALS — BP 110/72 | HR 77 | Ht 62.0 in | Wt 145.0 lb

## 2024-11-02 DIAGNOSIS — N200 Calculus of kidney: Secondary | ICD-10-CM | POA: Diagnosis not present

## 2024-11-02 DIAGNOSIS — R3129 Other microscopic hematuria: Secondary | ICD-10-CM | POA: Diagnosis not present

## 2024-11-02 DIAGNOSIS — N3281 Overactive bladder: Secondary | ICD-10-CM | POA: Diagnosis not present

## 2024-11-02 LAB — URINALYSIS, ROUTINE W REFLEX MICROSCOPIC
Bilirubin, UA: NEGATIVE
Glucose, UA: NEGATIVE
Nitrite, UA: NEGATIVE
Protein,UA: NEGATIVE
Specific Gravity, UA: 1.025 (ref 1.005–1.030)
Urobilinogen, Ur: 0.2 mg/dL (ref 0.2–1.0)
pH, UA: 6 (ref 5.0–7.5)

## 2024-11-02 LAB — MICROSCOPIC EXAMINATION

## 2024-11-02 MED ORDER — MIRABEGRON ER 50 MG PO TB24: 50.0000 mg | ORAL_TABLET | Freq: Every day | ORAL | 11 refills | Status: DC

## 2024-11-02 NOTE — Progress Notes (Signed)
 Assessment: 1. Nephrolithiasis   2. OAB (overactive bladder)   3. Microscopic hematuria     Plan: I reviewed the results of the recent ultrasound and discussed this with the patient. Continue stone prevention. Continue bladder diet Trial of  Myrbetriq  50 mg daily.  Prescription sent. Call with results of medication in 1 month. Will arrange follow-up pending results of medication.  Chief Complaint:  Chief Complaint  Patient presents with   Nephrolithiasis    History of Present Illness:  Cathy Simmons is a 58 y.o. female who is seen for continued evaluation of a left ureteral calculus. She presented to the emergency room at [facility] on 07/22/2024 with acute onset of left-sided flank pain.  She had radiation of the pain to her left abdomen.  She had associated nausea and vomiting.  No fevers.  She reports an episode of gross hematuria approximately 2 weeks ago. CT imaging showed an 8 mm proximal left ureteral calculus with moderate hydronephrosis. Creatinine normal at 0.99. Her pain had been well-controlled since that time without any pain medication.  No fever.  No gross hematuria. She continued to have lower urinary tract symptoms of frequency, urgency, and urge incontinence.  She was not on any medication.  She is on Xarelto  for management of blood clots.    She underwent cystoscopy with left retrograde pyelogram, left ureteroscopic laser lithotripsy, and insertion of left ureteral stent on 08/07/2024.  The stones were completely fragmented with the holmium laser. She presented to the emergency room on 08/09/2024 with left flank pain and gross hematuria with clots.  Urinalysis showed >50 RBCs, 11-20 WBCs, few bacteria, nitrite positive.  Urine culture showed no growth. CT abdomen and pelvis without contrast showed the left ureteral stent in appropriate position with calcifications in the left kidney, no ureteral calculi. Her Xarelto  was held per Dr. Frann.  Her stent was  removed on 08/23/2024.   Urine culture from 08/23/2024 grew 10-20 5K mixed flora.  She did well following stent removal.  She passed a number of stone fragments.  She was not having any flank pain, gross hematuria, or dysuria.  She continued to have fairly significant OAB symptoms with frequency, urgency, and urge incontinence.  She previously tried Vesicare  but did not see improvement in her symptoms and had problems with dry mouth.  Renal ultrasound from 09/29/2024 showed no evidence of hydronephrosis and a nonobstructing left renal calculus.  She returns today for follow-up.  She never got the Myrbetriq  prescription as the pharmacy apparently did not receive the prescription.  She continues with symptoms of frequency, see, and urge incontinence.  No dysuria or gross hematuria.   Urologic History: History of microscopic hematuria. U/A results:   4/21 3-6 RBC 5/21 7-10 RBC 6/21 7-10 RBC 8/22 0-5 RBC 11/22 6-10 RBC 9/24 3-6 RBC 10/24 11-20 RBC  CT imaging from 11/22 showed nonobstructive 5 mm left renal calculus. KUB from 08/10/2023 showed a cluster of calcifications in the upper pole of the left kidney, no obvious calcifications along the expected course of either ureter. No history of gross hematuria.  No dysuria or flank pain. She has previously had a hematuria evaluation by Dr. Rosalind at [facility] in August 2021.  CT abdomen and pelvis from 07/01/2020 showed a 7 mm left renal calculus.  Cystoscopy was normal. She has a prior history of tobacco use. CT hematuria profile from 09/30/2023 showed a 9 mm nonobstructing left lower pole renal calculus, no renal masses or obstruction, no filling  defects.  She was also noted to have a 3.2 right ovarian cyst. CX bladder was normal.  She has lower urinary tract symptoms including frequency, voiding every hour, urgency, some discomfort after voiding, nocturia 2-4 times, and urge incontinence.  The symptoms have been present for some time and  have worsened over the past several months.  No prior therapy for her symptoms. She was given a trial of Solifenacin  in 10/24.  She was seen in November 2024 for further evaluation of her microscopic hematuria and overactive bladder symptoms.  She had not had any gross hematuria.  She was complaining of some left sided back pain with radiation around to the left lower quadrant and into her left thigh.  She only took the Vesicare  for approximately 2 weeks.  She discontinued this due to some dry mouth as well as problems with strep throat making swallowing difficult.  She did not see any significant improvement in her symptoms with the medication.  She continued with frequency, urgency, and some discomfort with voiding.  She was given a trial of Myrbetriq  50 mg daily in 11/24.  CT abdomen and pelvis with contrast from 04/09/2024 showed multiple renal calculi on the left with the largest measuring 8 mm at the upper pole.   Portions of the above documentation were copied from a prior visit for review purposes only.  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   Depression    Diverticulitis    Family history of anesthesia complication    mother difficult to wake   GERD (gastroesophageal reflux disease)    History of blood clots    History of kidney stones    Hyperlipidemia    Osteoporosis    Pneumonia     Past Surgical History:  Past Surgical History:  Procedure Laterality Date   BREAST BIOPSY Right 2015   benign cysts showing apocrine metaplasia   BREAST EXCISIONAL BIOPSY Left 1995   2 times left breast, benign both times   CYSTOSCOPY/URETEROSCOPY/HOLMIUM LASER/STENT PLACEMENT Left 08/07/2024   Procedure: CYSTOSCOPY/URETEROSCOPY/HOLMIUM LASER/STENT PLACEMENT/RETROGRADE PYELOGRAM;  Surgeon: Roseann Adine PARAS., MD;   Location: [facility];  Service: Urology;  Laterality: Left;   DILATION AND CURETTAGE OF UTERUS  x2 1984 and 1991   excision left ganglion cyst Left    FOREIGN BODY REMOVAL Right 01/29/2013   Procedure: EXCISION FOREIGN BODY RIGHT SMALL FINGER;  Surgeon: Arley JONELLE Curia, MD;  Location: [facility];  Service: Orthopedics;  Laterality: Right;   SPLENECTOMY  2000   d/t calcified cyst from injury    Allergies:  Allergies  Allergen Reactions   Bactrim [Sulfamethoxazole-Trimethoprim] Other (See Comments)    Sulfa causes yeast infections!   Duloxetine     Other Reaction(s): Other (See Comments)  Passed out   Adhesive [Tape] Rash and Other (See Comments)    Band-Aids    Family History:  Family History  Problem Relation Age of Onset   Stroke Mother    Cancer Mother        breast cancer   Breast cancer Mother    Diabetes Father    Parkinson's disease Father    Colon cancer Neg Hx    Rectal cancer Neg Hx    Esophageal cancer Neg Hx    Stomach  cancer Neg Hx     Social History:  Social History   Tobacco Use   Smoking status: Former    Current packs/day: 0.00    Types: Cigarettes    Quit date: 01/30/1999    Years since quitting: 25.7   Smokeless tobacco: Never  Vaping Use   Vaping status: Never Used  Substance Use Topics   Alcohol use: No   Drug use: No    ROS: Constitutional:  Negative for fever, chills, weight loss CV: Negative for chest pain, previous MI, hypertension Respiratory:  Negative for shortness of breath, wheezing, sleep apnea, frequent cough GI:  Negative for nausea, vomiting, bloody stool, GERD  Physical exam: BP 110/72   Pulse 77   Ht 5' 2 (1.575 m)   Wt 145 lb (65.8 kg)   LMP 08/29/2018   BMI 26.52 kg/m  GENERAL APPEARANCE:  Well appearing, well developed, well nourished, NAD HEENT:  Atraumatic, normocephalic, oropharynx clear NECK:  Supple without lymphadenopathy or thyromegaly ABDOMEN:  Soft, non-tender, no masses EXTREMITIES:  Moves  all extremities well, without clubbing, cyanosis, or edema NEUROLOGIC:  Alert and oriented x 3, normal gait, CN II-XII grossly intact MENTAL STATUS:  appropriate BACK:  Non-tender to palpation, No CVAT SKIN:  Warm, dry, and intact  Results: U/A: 6-10 BC, 11-30 RBC

## 2024-11-07 ENCOUNTER — Other Ambulatory Visit: Payer: Self-pay | Admitting: Family Medicine

## 2024-11-25 DIAGNOSIS — L0291 Cutaneous abscess, unspecified: Secondary | ICD-10-CM | POA: Diagnosis not present

## 2024-11-30 ENCOUNTER — Other Ambulatory Visit: Payer: Self-pay | Admitting: Family Medicine

## 2024-11-30 ENCOUNTER — Encounter: Payer: Self-pay | Admitting: Family Medicine

## 2024-11-30 DIAGNOSIS — Z1231 Encounter for screening mammogram for malignant neoplasm of breast: Secondary | ICD-10-CM

## 2024-12-03 ENCOUNTER — Encounter: Payer: Self-pay | Admitting: Family Medicine

## 2024-12-03 ENCOUNTER — Ambulatory Visit: Admitting: Family Medicine

## 2024-12-03 VITALS — BP 126/80 | HR 75 | Temp 98.2°F | Wt 144.6 lb

## 2024-12-03 DIAGNOSIS — J111 Influenza due to unidentified influenza virus with other respiratory manifestations: Secondary | ICD-10-CM

## 2024-12-03 DIAGNOSIS — Z20828 Contact with and (suspected) exposure to other viral communicable diseases: Secondary | ICD-10-CM

## 2024-12-03 DIAGNOSIS — J029 Acute pharyngitis, unspecified: Secondary | ICD-10-CM | POA: Diagnosis not present

## 2024-12-03 LAB — POCT INFLUENZA A/B
Influenza A, POC: NEGATIVE
Influenza B, POC: NEGATIVE

## 2024-12-03 MED ORDER — OSELTAMIVIR PHOSPHATE 75 MG PO CAPS: 75.0000 mg | ORAL_CAPSULE | Freq: Two times a day (BID) | ORAL | 0 refills | Status: AC

## 2024-12-03 NOTE — Progress Notes (Signed)
 "      Cathy Simmons , 24-Jun-1966, 59 y.o., female MRN: [id number] Patient Care Team    Relationship Specialty Notifications Start End  Shokan, Cathy Simmons, [place] PCP - General Family Medicine  12/17/22     Chief Complaint  Patient presents with   Sore Throat    3 days; Sore throat, headache, congestion.Pt has tried Mucinex.      Subjective: Cathy Simmons is a 59 y.o. Pt presents for an OV with complaints of sore throat and hoarseness of 3 days duration.  Associated symptoms include exposure to influenza.  Her grandson had similar symptoms onset Friday evening, went to urgent care and tested positive for influenza A.  Patient reports Saturday evening into Sunday morning she started having symptoms.  In addition patient's husband is symptomatic and the grandson's mother. Pt has tried mucinex to ease their symptoms.      08/10/2024    9:51 AM 07/20/2024   12:53 PM 09/12/2023   11:10 AM 01/19/2023   11:12 AM 04/03/2021    3:45 PM  Depression screen PHQ 2/9  Decreased Interest 0 1 0 0 1  Down, Depressed, Hopeless 0 1 0 0 1  PHQ - 2 Score 0 2 0 0 2  Altered sleeping 0 1  0 1  Tired, decreased energy 0 1  0 1  Change in appetite 0 0  0 1  Feeling bad or failure about yourself  0 1  0 2  Trouble concentrating 0 0  0 1  Moving slowly or fidgety/restless 0 1  0 1  Suicidal thoughts 0 0  0 0  PHQ-9 Score 0  6   0  9   Difficult doing work/chores Not difficult at all Not difficult at all  Not difficult at all      Data saved with a previous flowsheet row definition    Allergies[1] Social History   Social History Narrative   Not on file   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   Depression    Diverticulitis    Family history of anesthesia complication    mother difficult to wake   GERD (gastroesophageal reflux disease)     History of blood clots    History of kidney stones    Hyperlipidemia    Osteoporosis    Pneumonia    Past Surgical History:  Procedure Laterality Date   BREAST BIOPSY Right 2015   benign cysts showing apocrine metaplasia   BREAST EXCISIONAL BIOPSY Left 1995   2 times left breast, benign both times   CYSTOSCOPY/URETEROSCOPY/HOLMIUM LASER/STENT PLACEMENT Left 08/07/2024   Procedure: CYSTOSCOPY/URETEROSCOPY/HOLMIUM LASER/STENT PLACEMENT/RETROGRADE PYELOGRAM;  Surgeon: Roseann Adine PARAS., MD;  Location: [facility];  Service: Urology;  Laterality: Left;   DILATION AND CURETTAGE OF UTERUS  x2 1984 and 1991   excision left ganglion cyst Left    FOREIGN BODY REMOVAL Right 01/29/2013   Procedure: EXCISION FOREIGN BODY RIGHT SMALL FINGER;  Surgeon: Arley JONELLE Curia, MD;  Location: [facility];  Service: Orthopedics;  Laterality: Right;   SPLENECTOMY  2000   d/t calcified cyst from injury   Family History  Problem Relation Age of Onset   Stroke Mother    Cancer Mother        breast  cancer   Breast cancer Mother    Diabetes Father    Parkinson's disease Father    Colon cancer Neg Hx    Rectal cancer Neg Hx    Esophageal cancer Neg Hx    Stomach cancer Neg Hx    Allergies as of 12/03/2024       Reactions   Bactrim [sulfamethoxazole-trimethoprim] Other (See Comments)   Sulfa causes yeast infections!   Duloxetine    Other Reaction(s): Other (See Comments) Passed out   Adhesive [tape] Rash, Other (See Comments)   Band-Aids        Medication List        Accurate as of December 03, 2024  9:26 AM. If you have any questions, ask your nurse or doctor.          acetaminophen  650 MG CR tablet Commonly known as: TYLENOL  Take 650-1,300 mg by mouth every 8 (eight) hours as needed for pain.   cetirizine 10 MG tablet Commonly known as: ZYRTEC Take 10 mg by mouth at bedtime.   clonazePAM  0.5 MG tablet Commonly known as: KLONOPIN  Take 1 tablet (0.5 mg total) by mouth 2 (two)  times daily as needed. for anxiety   esomeprazole 20 MG capsule Commonly known as: NEXIUM Take 20 mg by mouth every evening.   mirabegron  ER 50 MG Tb24 tablet Commonly known as: MYRBETRIQ  Take 1 tablet (50 mg total) by mouth daily.   oseltamivir  75 MG capsule Commonly known as: Tamiflu  Take 1 capsule (75 mg total) by mouth 2 (two) times daily. Started by: Charlies Bellini, DO   rosuvastatin  20 MG tablet Commonly known as: CRESTOR  Take 1 tablet by mouth once daily   SUPER C COMPLEX PO Take 1 capsule by mouth at bedtime.   Xarelto  20 MG Tabs tablet Generic drug: rivaroxaban  TAKE 1 TABLET BY MOUTH ONCE DAILY WITH SUPPER        All past medical history, surgical history, allergies, family history, immunizations andmedications were updated in the EMR today and reviewed under the history and medication portions of their EMR.     Review of Systems  Constitutional:  Positive for malaise/fatigue. Negative for chills and fever.  HENT:  Positive for congestion, ear pain and sore throat.   Eyes: Negative.   Respiratory:  Positive for cough. Negative for sputum production, shortness of breath and wheezing.   Cardiovascular: Negative.   Gastrointestinal:  Positive for diarrhea. Negative for abdominal pain, constipation and vomiting.  Genitourinary: Negative.   Musculoskeletal:  Positive for myalgias.  Skin:  Negative for rash.  Neurological:  Positive for headaches.  Endo/Heme/Allergies: Negative.   Psychiatric/Behavioral: Negative.     Negative, with the exception of above mentioned in HPI   Objective:  BP 126/80   Pulse 75   Temp 98.2 F (36.8 C)   Wt 144 lb 9.6 oz (65.6 kg)   LMP 08/29/2018   SpO2 97%   BMI 26.45 kg/m  Body mass index is 26.45 kg/m. Physical Exam Vitals and nursing note reviewed.  Constitutional:      General: She is not in acute distress.    Appearance: Normal appearance. She is normal weight. She is not ill-appearing or toxic-appearing.  HENT:      Head: Normocephalic and atraumatic.     Right Ear: Tympanic membrane, ear canal and external ear normal.     Left Ear: Tympanic membrane, ear canal and external ear normal.     Nose: Congestion and rhinorrhea present.     Mouth/Throat:  Mouth: Mucous membranes are moist.     Pharynx: Posterior oropharyngeal erythema present.  Eyes:     General: No scleral icterus.       Right eye: No discharge.        Left eye: No discharge.     Extraocular Movements: Extraocular movements intact.     Conjunctiva/sclera: Conjunctivae normal.     Pupils: Pupils are equal, round, and reactive to light.  Cardiovascular:     Rate and Rhythm: Normal rate and regular rhythm.  Pulmonary:     Effort: Pulmonary effort is normal. No respiratory distress.     Breath sounds: Normal breath sounds. No wheezing, rhonchi or rales.  Musculoskeletal:     Cervical back: Neck supple. Tenderness present.  Lymphadenopathy:     Cervical: Cervical adenopathy present.  Skin:    Findings: No rash.  Neurological:     Mental Status: She is alert and oriented to person, place, and time. Mental status is at baseline.     Motor: No weakness.     Coordination: Coordination normal.     Gait: Gait normal.  Psychiatric:        Mood and Affect: Mood normal.        Behavior: Behavior normal.        Thought Content: Thought content normal.        Judgment: Judgment normal.      No results found. No results found. Results for orders placed or performed in visit on 12/03/24 (from the past 24 hours)  POCT Influenza A/B     Status: Normal   Collection Time: 12/03/24  9:18 AM  Result Value Ref Range   Influenza A, POC Negative Negative   Influenza B, POC Negative Negative    Assessment/Plan: ZAKKIYYA BARNO is a 59 y.o. female present for OV for  Sore throat/Exposure to influenza/ Influenza-like illness (Primary) - POCT Influenza A/B> negative test today however patient had close exposure with a grandson that lives in  her home and exam is consistent with influenza. Elected to treat with Tamiflu  twice daily x 5 days Work excuse provided Rest-hydrate-NSAIDs-Mucinex Follow-up 1 to 2 weeks if symptoms are not improving, sooner if worsening  Reviewed expectations re: course of current medical issues. Discussed self-management of symptoms. Outlined signs and symptoms indicating need for more acute intervention. Patient verbalized understanding and all questions were answered. Patient received an After-Visit Summary.    Orders Placed This Encounter  Procedures   POCT Influenza A/B   Meds ordered this encounter  Medications   oseltamivir  (TAMIFLU ) 75 MG capsule    Sig: Take 1 capsule (75 mg total) by mouth 2 (two) times daily.    Dispense:  10 capsule    Refill:  0   Referral Orders  No referral(s) requested today     Note is dictated utilizing voice recognition software. Although note has been proof read prior to signing, occasional typographical errors still can be missed. If any questions arise, please do not hesitate to call for verification.   electronically signed by:  Charlies Bellini, DO  [facility] [place]       [1]  Allergies Allergen Reactions   Bactrim [Sulfamethoxazole-Trimethoprim] Other (See Comments)    Sulfa causes yeast infections!   Duloxetine     Other Reaction(s): Other (See Comments)  Passed out   Adhesive [Tape] Rash and Other (See Comments)    Band-Aids   "

## 2024-12-03 NOTE — Patient Instructions (Addendum)

## 2024-12-05 ENCOUNTER — Ambulatory Visit: Payer: Self-pay | Admitting: *Deleted

## 2024-12-05 ENCOUNTER — Ambulatory Visit: Payer: Self-pay

## 2024-12-05 NOTE — Telephone Encounter (Signed)
 FYI Only or Action Required?: FYI only for provider: appointment scheduled on 1/8.  Patient was last seen in primary care on 12/03/2024 by Catherine Fuller A, DO.  Called Nurse Triage reporting Sinusitis.  Symptoms began yesterday.  Interventions attempted: OTC medications: mucinex and Prescription medications: Tamiflu .  Symptoms are: gradually worsening.  Triage Disposition: See PCP When Office is Open (Within 3 Days)  Patient/caregiver understands and will follow disposition?: Yes  Copied from CRM #[id number]. Topic: Clinical - Red Word Triage >> Dec 05, 2024  3:15 PM Tiffini S wrote: Red Word that prompted transfer to Nurse Triage: Patient test negative for the flu on 12/03/24 (her grandson had the flu)- she have developed a sinus infection now-  coughing, sneezing, eye pain, stuffy head, congestion, neck glands are swollen, facial pain Reason for Disposition  Lots of coughing  Answer Assessment - Initial Assessment Questions 1. LOCATION: Where does it hurt?      Across the nose and under cheekbones 2. ONSET: When did the sinus pain start?  (e.g., hours, days)      Started yesterday 3. SEVERITY: How bad is the pain?   (Scale 0-10; or none, mild, moderate or severe)     2/10 4. RECURRENT SYMPTOM: Have you ever had sinus problems before? If Yes, ask: When was the last time? and What happened that time?      Yes- 6 months ago- antibiotic treatment 5. NASAL CONGESTION: Is the nose blocked? If Yes, ask: Can you open it or must you breathe through your mouth?     Off and on- sometimes stuffy and some running 6. NASAL DISCHARGE: Do you have discharge from your nose? If so ask, What color?     yellowish 7. FEVER: Do you have a fever? If Yes, ask: What is it, how was it measured, and when did it start?      no 8. OTHER SYMPTOMS: Do you have any other symptoms? (e.g., sore throat, cough, earache, difficulty breathing)     Swollen neck glands, sore throat, ear  ache  Patient was seen in office Monday-  negative for flu-( grandson tested + flu Friday) Patient was given Tamiflu  and Mucinex. Patient states her symptoms are worse- now having sinus pain and presuure  Protocols used: Sinus Pain or Congestion-A-AH

## 2024-12-05 NOTE — Telephone Encounter (Signed)
"  Appt scheduled  "

## 2024-12-05 NOTE — Telephone Encounter (Signed)
 FYI Only or Action Required?: FYI only for provider: appointment scheduled on 12/07/24.  Patient was last seen in primary care on 12/03/2024 by Catherine Fuller A, DO.  Copied from CRM [id number]. Topic: Clinical - Red Word Triage >> Dec 05, 2024  3:15 PM Tiffini S wrote: Red Word that prompted transfer to Nurse Triage: Patient test positive for the flu on 12/03/24 (her grandson had the flu)- she have developed a sinus infection now-  coughing, sneezing, eye pain, stuffy head, congestion, neck glands are swollen, facial pain Reason for Disposition  Requesting regular office appointment  Answer Assessment - Initial Assessment Questions 1. REASON FOR CALL: What is the main reason for your call? or How can I best help you? Rescheduling appointment  Protocols used: Information Only Call - No Triage-A-AH

## 2024-12-06 ENCOUNTER — Ambulatory Visit: Admitting: Sports Medicine

## 2024-12-07 ENCOUNTER — Ambulatory Visit: Admitting: Family Medicine

## 2024-12-07 ENCOUNTER — Encounter: Payer: Self-pay | Admitting: Family Medicine

## 2024-12-07 VITALS — BP 124/62 | HR 80 | Temp 98.4°F | Ht 62.0 in | Wt 143.0 lb

## 2024-12-07 DIAGNOSIS — R051 Acute cough: Secondary | ICD-10-CM

## 2024-12-07 DIAGNOSIS — J3489 Other specified disorders of nose and nasal sinuses: Secondary | ICD-10-CM | POA: Diagnosis not present

## 2024-12-07 DIAGNOSIS — J0140 Acute pansinusitis, unspecified: Secondary | ICD-10-CM

## 2024-12-07 MED ORDER — AMOXICILLIN-POT CLAVULANATE 875-125 MG PO TABS: 1.0000 | ORAL_TABLET | Freq: Two times a day (BID) | ORAL | 0 refills | Status: AC

## 2024-12-07 MED ORDER — PROMETHAZINE-DM 6.25-15 MG/5ML PO SYRP: 5.0000 mL | ORAL_SOLUTION | Freq: Two times a day (BID) | ORAL | 0 refills | Status: AC | PRN

## 2024-12-07 MED ORDER — FLUCONAZOLE 150 MG PO TABS: 150.0000 mg | ORAL_TABLET | Freq: Every day | ORAL | 0 refills | Status: AC

## 2024-12-07 MED ORDER — BENZONATATE 200 MG PO CAPS: 200.0000 mg | ORAL_CAPSULE | Freq: Two times a day (BID) | ORAL | 0 refills | Status: AC | PRN

## 2024-12-07 NOTE — Progress Notes (Signed)
 "  Acute Office Visit  Subjective:  Patient ID: Cathy Simmons, female    DOB: 1966/03/20  Age: 59 y.o. MRN: [id number]  CC:  Chief Complaint  Patient presents with   Cough   Sinus Problem      HPI Cathy Simmons is here for sinus pain, URI Symptoms.     Discussed the use of AI scribe software for clinical note transcription with the patient, who gave verbal consent to proceed.  History of Present Illness Cathy Simmons is a 59 year old female who presents with symptoms of a sinus infection following flu exposure.  She experienced significant flu exposure last week, beginning on Thursday while caring for her grandson who later developed a high fever and tested positive for the flu. By late Sunday evening, she began experiencing symptoms herself, including a sore throat, ear pain, and general malaise, although she did not develop a fever.  She reports increased sinus pressure, coughing, and sneezing. She notes that when she lies on one side for a long time, she feels the pressure shift to that side. No coughing up blood, fever, wheezing, or trouble breathing, but she mentions throat irritation and ear pain. She also reports joint pain and a general feeling of body aches.  She has been using over-the-counter medications, including Mucinex, to manage her symptoms. She received a flu shot earlier in the year.       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   Depression    Diverticulitis    Family history of anesthesia complication    mother difficult to wake   GERD (gastroesophageal reflux disease)    History of blood clots    History of kidney stones    Hyperlipidemia    Osteoporosis    Pneumonia     Past Surgical History:  Procedure Laterality Date   BREAST BIOPSY Right 2015   benign cysts showing apocrine  metaplasia   BREAST EXCISIONAL BIOPSY Left 1995   2 times left breast, benign both times   CYSTOSCOPY/URETEROSCOPY/HOLMIUM LASER/STENT PLACEMENT Left 08/07/2024   Procedure: CYSTOSCOPY/URETEROSCOPY/HOLMIUM LASER/STENT PLACEMENT/RETROGRADE PYELOGRAM;  Surgeon: Roseann Adine PARAS., MD;  Location: [facility];  Service: Urology;  Laterality: Left;   DILATION AND CURETTAGE OF UTERUS  x2 1984 and 1991   excision left ganglion cyst Left    FOREIGN BODY REMOVAL Right 01/29/2013   Procedure: EXCISION FOREIGN BODY RIGHT SMALL FINGER;  Surgeon: Arley JONELLE Curia, MD;  Location: [facility];  Service: Orthopedics;  Laterality: Right;   SPLENECTOMY  2000   d/t calcified cyst from injury    Family History  Problem Relation Age of Onset   Stroke Mother    Cancer Mother        breast cancer   Breast cancer Mother    Diabetes Father    Parkinson's disease Father    Colon cancer Neg Hx    Rectal cancer Neg Hx    Esophageal cancer Neg Hx    Stomach cancer Neg Hx     Social History   Socioeconomic History   Marital status: Married    Spouse name: Not on file   Number of children: Not on file   Years of education: Not on file   Highest education level: 12th grade  Occupational History   Not on file  Tobacco Use   Smoking status: Former    Current packs/day: 0.00    Average packs/day: 0.5 packs/day    Types: Cigarettes    Quit date: 01/30/1999    Years since quitting: 25.8   Smokeless tobacco: Never  Vaping Use   Vaping status: Never Used  Substance and Sexual Activity   Alcohol use: No   Drug use: No   Sexual activity: Yes  Other Topics Concern   Not on file  Social History Narrative   Not on file   Social Drivers of Health   Tobacco Use: Medium Risk (12/07/2024)   Patient History    Smoking Tobacco Use: Former    Smokeless Tobacco Use: Never    Passive Exposure: Not on Actuary Strain: Low Risk (08/10/2024)   Overall Financial Resource Strain (CARDIA)     Difficulty of Paying Living Expenses: Not very hard  Food Insecurity: No Food Insecurity (08/10/2024)   Epic    Worried About Programme Researcher, Broadcasting/film/video in the Last Year: Never true    Ran Out of Food in the Last Year: Never true  Transportation Needs: No Transportation Needs (08/10/2024)   Epic    Lack of Transportation (Medical): No    Lack of Transportation (Non-Medical): No  Physical Activity: Insufficiently Active (08/10/2024)   Exercise Vital Sign    Days of Exercise per Week: 3 days    Minutes of Exercise per Session: 10 min  Stress: Stress Concern Present (08/10/2024)   [employer] of Occupational Health - Occupational Stress Questionnaire    Feeling of Stress: Rather much  Social Connections: Socially Integrated (08/10/2024)   Social Connection and Isolation Panel    Frequency of Communication with Friends and Family: More than three times a week    Frequency of Social Gatherings with Friends and Family: Once a week    Attends Religious Services: More than 4 times per year    Active Member of Clubs or Organizations: Yes    Attends Banker Meetings: More than 4 times per year    Marital Status: Married  Catering Manager Violence: Not on file  Depression (PHQ2-9): Medium Risk (12/07/2024)   Depression (PHQ2-9)    PHQ-2 Score: 6  Alcohol Screen: Low Risk (04/13/2024)   Alcohol Screen    Last Alcohol Screening Score (AUDIT): 1  Housing: High Risk (08/10/2024)   Epic    Unable to Pay for Housing in the Last Year: Yes    Number of Times Moved in the Last Year: 0    Homeless in the Last Year: No  Utilities: Not on file  Health Literacy: Not on file    ROS All ROS negative except what is listed in the HPI.   Objective:   Today's Vitals: BP 124/62   Pulse 80   Temp 98.4 F (36.9 C) (Oral)   Ht 5' 2 (1.575 m)   Wt 143 lb (64.9 kg)   LMP 08/29/2018   SpO2 95%   BMI 26.16 kg/m   Physical Exam Vitals reviewed.  Constitutional:      Appearance: Normal  appearance.  HENT:     Head: Normocephalic and atraumatic.     Right Ear: Tympanic membrane normal.     Left Ear: Tympanic membrane normal.     Nose: Congestion present.     Right Sinus: Maxillary sinus tenderness present.     Left Sinus: Maxillary sinus tenderness present.  Cardiovascular:  Rate and Rhythm: Normal rate and regular rhythm.     Heart sounds: Normal heart sounds.  Pulmonary:     Effort: Pulmonary effort is normal.     Breath sounds: Normal breath sounds. No wheezing, rhonchi or rales.  Musculoskeletal:     Cervical back: Normal range of motion and neck supple.  Lymphadenopathy:     Cervical: No cervical adenopathy.  Skin:    General: Skin is warm and dry.  Neurological:     Mental Status: She is alert and oriented to person, place, and time.  Psychiatric:        Mood and Affect: Mood normal.        Behavior: Behavior normal.        Thought Content: Thought content normal.        Judgment: Judgment normal.         Assessment & Plan:   Problem List Items Addressed This Visit   None Visit Diagnoses       Acute non-recurrent pansinusitis    -  Primary   Relevant Medications   benzonatate  (TESSALON ) 200 MG capsule   promethazine -dextromethorphan (PROMETHAZINE -DM) 6.25-15 MG/5ML syrup   amoxicillin -clavulanate (AUGMENTIN ) 875-125 MG tablet   fluconazole  (DIFLUCAN ) 150 MG tablet     Sinus pressure         Acute cough           Assessment & Plan Acute pansinusitis following viral infection - Prescribed Augmentin  twice daily for 7-10 days - Provided yeast infection medication if needed. - Continue Mucinex, steamy showers, humidifier, nasal sprays, saline, Flonase , warm compresses, and fluids. - Prescribed cough syrup as needed, cautioning about drowsiness. - Prescribed cough pearls for daytime use. - Provided work note for return on Monday.      Follow-up: Return if symptoms worsen or fail to improve.   Waddell FURY Almarie, DNP, FNP-C  I,Emily  Lagle,acting as a neurosurgeon for Waddell KATHEE Almarie, NP.,have documented all relevant documentation on the behalf of Waddell KATHEE Almarie, NP.   I, Waddell KATHEE Almarie, NP, have reviewed all documentation for this visit. The documentation on 12/07/2024 for the exam, diagnosis, procedures, and orders are all accurate and complete. "

## 2024-12-28 ENCOUNTER — Ambulatory Visit

## 2024-12-28 ENCOUNTER — Ambulatory Visit (HOSPITAL_COMMUNITY)
Admission: RE | Admit: 2024-12-28 | Discharge: 2024-12-28 | Disposition: A | Source: Ambulatory Visit | Attending: Family Medicine

## 2024-12-28 DIAGNOSIS — Z1231 Encounter for screening mammogram for malignant neoplasm of breast: Secondary | ICD-10-CM | POA: Insufficient documentation

## 2025-01-18 ENCOUNTER — Ambulatory Visit: Admitting: Family Medicine

## 2025-02-08 ENCOUNTER — Ambulatory Visit: Admitting: Family Medicine
